# Patient Record
Sex: Male | Born: 1950 | Race: White | Hispanic: No | Marital: Married | Smoking: Current every day smoker
Health system: Southern US, Community
[De-identification: ages and names within clinical notes are randomized; demographics above are authoritative.]

## PROBLEM LIST (undated history)

## (undated) DIAGNOSIS — N50811 Right testicular pain: Secondary | ICD-10-CM

## (undated) DIAGNOSIS — Z950 Presence of cardiac pacemaker: Secondary | ICD-10-CM

## (undated) DIAGNOSIS — R310 Gross hematuria: Secondary | ICD-10-CM

## (undated) DIAGNOSIS — R972 Elevated prostate specific antigen [PSA]: Secondary | ICD-10-CM

## (undated) DIAGNOSIS — R361 Hematospermia: Secondary | ICD-10-CM

## (undated) DIAGNOSIS — Z860100 Personal history of colon polyps, unspecified: Secondary | ICD-10-CM

## (undated) DIAGNOSIS — J439 Emphysema, unspecified: Secondary | ICD-10-CM

## (undated) DIAGNOSIS — E785 Hyperlipidemia, unspecified: Secondary | ICD-10-CM

## (undated) DIAGNOSIS — C61 Malignant neoplasm of prostate: Secondary | ICD-10-CM

## (undated) DIAGNOSIS — D126 Benign neoplasm of colon, unspecified: Secondary | ICD-10-CM

## (undated) DIAGNOSIS — N50812 Left testicular pain: Secondary | ICD-10-CM

## (undated) DIAGNOSIS — D49519 Neoplasm of unspecified behavior of unspecified kidney: Secondary | ICD-10-CM

## (undated) DIAGNOSIS — T7840XA Allergy, unspecified, initial encounter: Secondary | ICD-10-CM

## (undated) DIAGNOSIS — Z720 Tobacco use: Secondary | ICD-10-CM

## (undated) DIAGNOSIS — C649 Malignant neoplasm of unspecified kidney, except renal pelvis: Secondary | ICD-10-CM

## (undated) DIAGNOSIS — Z923 Personal history of irradiation: Secondary | ICD-10-CM

## (undated) DIAGNOSIS — N529 Male erectile dysfunction, unspecified: Secondary | ICD-10-CM

## (undated) DIAGNOSIS — C3490 Malignant neoplasm of unspecified part of unspecified bronchus or lung: Secondary | ICD-10-CM

## (undated) DIAGNOSIS — M1990 Unspecified osteoarthritis, unspecified site: Secondary | ICD-10-CM

## (undated) DIAGNOSIS — M545 Low back pain, unspecified: Secondary | ICD-10-CM

## (undated) DIAGNOSIS — I10 Essential (primary) hypertension: Secondary | ICD-10-CM

## (undated) DIAGNOSIS — J189 Pneumonia, unspecified organism: Secondary | ICD-10-CM

## (undated) DIAGNOSIS — N281 Cyst of kidney, acquired: Secondary | ICD-10-CM

## (undated) DIAGNOSIS — H269 Unspecified cataract: Secondary | ICD-10-CM

## (undated) HISTORY — DX: Personal history of irradiation: Z92.3

## (undated) HISTORY — DX: Allergy, unspecified, initial encounter: T78.40XA

## (undated) HISTORY — DX: Emphysema, unspecified: J43.9

## (undated) HISTORY — DX: Low back pain, unspecified: M54.50

## (undated) HISTORY — DX: Unspecified cataract: H26.9

## (undated) HISTORY — DX: Tobacco use: Z72.0

## (undated) HISTORY — DX: Hematospermia: R36.1

## (undated) HISTORY — DX: Malignant neoplasm of unspecified kidney, except renal pelvis: C64.9

## (undated) HISTORY — PX: WISDOM TOOTH EXTRACTION: SHX21

## (undated) HISTORY — DX: Cyst of kidney, acquired: N28.1

## (undated) HISTORY — DX: Personal history of colon polyps, unspecified: Z86.0100

## (undated) HISTORY — DX: Neoplasm of unspecified behavior of unspecified kidney: D49.519

## (undated) HISTORY — DX: Male erectile dysfunction, unspecified: N52.9

## (undated) HISTORY — DX: Benign neoplasm of colon, unspecified: D12.6

## (undated) HISTORY — DX: Hyperlipidemia, unspecified: E78.5

## (undated) HISTORY — DX: Gross hematuria: R31.0

## (undated) HISTORY — DX: Low back pain: M54.5

## (undated) HISTORY — DX: Unspecified osteoarthritis, unspecified site: M19.90

## (undated) HISTORY — DX: Personal history of colonic polyps: Z86.010

## (undated) HISTORY — PX: COLONOSCOPY: SHX174

## (undated) HISTORY — PX: POLYPECTOMY: SHX149

## (undated) HISTORY — DX: Left testicular pain: N50.812

## (undated) HISTORY — DX: Right testicular pain: N50.811

## (undated) HISTORY — PX: INNER EAR SURGERY: SHX679

## (undated) HISTORY — PX: HERNIA REPAIR: SHX51

---

## 2004-10-05 ENCOUNTER — Emergency Department (HOSPITAL_COMMUNITY): Admission: EM | Admit: 2004-10-05 | Discharge: 2004-10-05 | Payer: Self-pay | Admitting: Family Medicine

## 2004-10-17 ENCOUNTER — Ambulatory Visit (HOSPITAL_COMMUNITY): Admission: RE | Admit: 2004-10-17 | Discharge: 2004-10-17 | Payer: Self-pay | Admitting: Family Medicine

## 2006-03-16 ENCOUNTER — Emergency Department (HOSPITAL_COMMUNITY): Admission: EM | Admit: 2006-03-16 | Discharge: 2006-03-16 | Payer: Self-pay | Admitting: Emergency Medicine

## 2006-08-19 ENCOUNTER — Ambulatory Visit (HOSPITAL_BASED_OUTPATIENT_CLINIC_OR_DEPARTMENT_OTHER): Admission: RE | Admit: 2006-08-19 | Discharge: 2006-08-19 | Payer: Self-pay | Admitting: Surgery

## 2007-03-20 DIAGNOSIS — D126 Benign neoplasm of colon, unspecified: Secondary | ICD-10-CM

## 2007-03-20 HISTORY — DX: Benign neoplasm of colon, unspecified: D12.6

## 2007-12-01 ENCOUNTER — Encounter: Payer: Self-pay | Admitting: Internal Medicine

## 2007-12-08 ENCOUNTER — Encounter: Payer: Self-pay | Admitting: Internal Medicine

## 2007-12-23 ENCOUNTER — Ambulatory Visit: Payer: Self-pay | Admitting: Internal Medicine

## 2008-01-06 ENCOUNTER — Ambulatory Visit: Payer: Self-pay | Admitting: Internal Medicine

## 2008-01-06 ENCOUNTER — Encounter: Payer: Self-pay | Admitting: Internal Medicine

## 2008-01-08 ENCOUNTER — Encounter: Payer: Self-pay | Admitting: Internal Medicine

## 2009-11-11 ENCOUNTER — Encounter: Payer: Self-pay | Admitting: Internal Medicine

## 2010-01-11 DIAGNOSIS — Z720 Tobacco use: Secondary | ICD-10-CM | POA: Insufficient documentation

## 2010-01-13 ENCOUNTER — Encounter (INDEPENDENT_AMBULATORY_CARE_PROVIDER_SITE_OTHER): Payer: Self-pay | Admitting: *Deleted

## 2010-04-04 ENCOUNTER — Encounter (INDEPENDENT_AMBULATORY_CARE_PROVIDER_SITE_OTHER): Payer: Self-pay | Admitting: *Deleted

## 2010-04-07 ENCOUNTER — Ambulatory Visit
Admission: RE | Admit: 2010-04-07 | Discharge: 2010-04-07 | Payer: Self-pay | Source: Home / Self Care | Attending: Internal Medicine | Admitting: Internal Medicine

## 2010-04-09 ENCOUNTER — Encounter: Payer: Self-pay | Admitting: Family Medicine

## 2010-04-20 NOTE — Letter (Signed)
Summary: [facility] Instructions  [facility]  [address] [place], [place] [address]   Phone: [phone]  Fax: [phone]       Dylan Harvey    [birth year]    MRN: [id number]        Procedure Day /Date:  Friday 04/21/3010     Arrival Time: 8:30 am     Procedure Time: 9:30 am     Location of Procedure:                    _ x_  [facility] (4th Floor)                        PREPARATION FOR COLONOSCOPY WITH MOVIPREP   Starting 5 days prior to your procedure Sunday 1/29 do not eat nuts, seeds, popcorn, corn, beans, peas,  salads, or any raw vegetables.  Do not take any fiber supplements (e.g. Metamucil, Citrucel, and Benefiber).  THE DAY BEFORE YOUR PROCEDURE         DATE: Thursday 2/2 1.  Drink clear liquids the entire day-NO SOLID FOOD  2.  Do not drink anything colored red or purple.  Avoid juices with pulp.  No orange juice.  3.  Drink at least 64 oz. (8 glasses) of fluid/clear liquids during the day to prevent dehydration and help the prep work efficiently.  CLEAR LIQUIDS INCLUDE: Water Jello Ice Popsicles Tea (sugar ok, no milk/cream) Powdered fruit flavored drinks Coffee (sugar ok, no milk/cream) Gatorade Juice: apple, white grape, white cranberry  Lemonade Clear bullion, consomm, broth Carbonated beverages (any kind) Strained chicken noodle soup Hard Candy                             4.  In the morning, mix first dose of MoviPrep solution:    Empty 1 Pouch A and 1 Pouch B into the disposable container    Add lukewarm drinking water to the top line of the container. Mix to dissolve    Refrigerate (mixed solution should be used within 24 hrs)  5.  Begin drinking the prep at 5:00 p.m. The MoviPrep container is divided by 4 marks.   Every 15 minutes drink the solution down to the next mark (approximately 8 oz) until the full liter is complete.   6.  Follow completed prep with 16 oz of clear liquid of your choice (Nothing red  or purple).  Continue to drink clear liquids until bedtime.  7.  Before going to bed, mix second dose of MoviPrep solution:    Empty 1 Pouch A and 1 Pouch B into the disposable container    Add lukewarm drinking water to the top line of the container. Mix to dissolve    Refrigerate  THE DAY OF YOUR PROCEDURE      DATE: Friday 2/3  Beginning at 4:30 a.m. (5 hours before procedure):         1. Every 15 minutes, drink the solution down to the next mark (approx 8 oz) until the full liter is complete.  2. Follow completed prep with 16 oz. of clear liquid of your choice.    3. You may drink clear liquids until 7:30 am (2 HOURS BEFORE PROCEDURE).   MEDICATION INSTRUCTIONS  Unless otherwise instructed, you should take regular prescription medications with a small sip of water   as early as possible the morning of your procedure.  OTHER INSTRUCTIONS  You will need a responsible adult at least 61 years of age to accompany you and drive you home.   This person must remain in the waiting room during your procedure.  Wear loose fitting clothing that is easily removed.  Leave jewelry and other valuables at home.  However, you may wish to bring a book to read or  an iPod/MP3 player to listen to music as you wait for your procedure to start.  Remove all body piercing jewelry and leave at home.  Total time from sign-in until discharge is approximately 2-3 hours.  You should go home directly after your procedure and rest.  You can resume normal activities the  day after your procedure.  The day of your procedure you should not:   Drive   Make legal decisions   Operate machinery   Drink alcohol   Return to work  You will receive specific instructions about eating, activities and medications before you leave.    The above instructions have been reviewed and explained to me by   Ezra Sites RN  April 07, 2010 11:41 AM    I fully understand and can verbalize  these instructions _____________________________ Date _________

## 2010-04-20 NOTE — Miscellaneous (Signed)
Summary: [facility]  Clinical Lists Changes  Medications: Added new medication of MOVIPREP 100 GM  SOLR (PEG-KCL-NACL-NASULF-NA ASC-C) As per prep instructions. - Signed Rx of MOVIPREP 100 GM  SOLR (PEG-KCL-NACL-NASULF-NA ASC-C) As per prep instructions.;  #1 x 0;  Signed;  Entered by: Ezra Sites RN;  Authorized by: Hilarie Fredrickson MD;  Method used: [facility] to [facility] [address] [phone]*, [address], [place], [place], [place]  [address], Ph: [phone] or [phone], Fax: [phone] Allergies: Changed allergy or adverse reaction from NEOSPORIN to NEOSPORIN    Prescriptions: MOVIPREP 100 GM  SOLR (PEG-KCL-NACL-NASULF-NA ASC-C) As per prep instructions.  #1 x 0   Entered by:   Ezra Sites RN   Authorized by:   Hilarie Fredrickson MD   Signed by:   Ezra Sites RN on 04/07/2010   Method used:   [facility]        [facility] [address] [phone]* (retail)       [address]       [place], [place]  [address]       Ph: [phone] or [phone]       Fax: [phone]   RxID:   [id number]

## 2010-04-20 NOTE — Letter (Signed)
Summary: Pre Visit Letter Revised  [facility]  [address] [place], [place] [address]   Phone: [phone]  Fax: [phone]        01/13/2010 MRN: [id number] Dylan Harvey [address] [place], [place]  [address]             Procedure Date: 02-28-10   Welcome to the Gastroenterology Division at [facility].    You are scheduled to see a nurse for your pre-procedure visit on 02-14-10 at 2:30p.m. on the 3rd floor at [facility], [address]  We ask that you try to arrive at our office 15 minutes prior to your appointment time to allow for check-in.  Please take a minute to review the attached form.  If you answer "Yes" to one or more of the questions on the first page, we ask that you call the person listed at your earliest opportunity.  If you answer "No" to all of the questions, please complete the rest of the form and bring it to your appointment.    Your nurse visit will consist of discussing your medical and surgical history, your immediate family medical history, and your medications.   If you are unable to list all of your medications on the form, please bring the medication bottles to your appointment and we will list them.  We will need to be aware of both prescribed and over the counter drugs.  We will need to know exact dosage information as well.    Please be prepared to read and sign documents such as consent forms, a financial agreement, and acknowledgement forms.  If necessary, and with your consent, a friend or relative is welcome to sit-in on the nurse visit with you.  Please bring your insurance card so that we may make a copy of it.  If your insurance requires a referral to see a specialist, please bring your referral form from your primary care physician.  No co-pay is required for this nurse visit.     If you cannot keep your appointment, please call [phone] to cancel or reschedule prior to your appointment date.  This allows  [place] the opportunity to schedule an appointment for another patient in need of care.    Thank you for choosing [facility] for your medical needs.  We appreciate the opportunity to care for you.  Please visit [place] at our website  to learn more about our practice.  Sincerely, The Gastroenterology Division

## 2010-04-20 NOTE — Letter (Signed)
Summary: Colonoscopy Letter  [facility]  [address] [place], [place] [address]   Phone: [phone]  Fax: [phone]      November 11, 2009 MRN: [id number]   AYIDEN MILLIMAN [address] [place], [place]  [address]   Dear Mr. Cudmore,   According to your medical record, it is time for you to schedule a Colonoscopy. The American Cancer Society recommends this procedure as a method to detect early colon cancer. Patients with a family history of colon cancer, or a personal history of colon polyps or inflammatory bowel disease are at increased risk.  This letter has been generated based on the recommendations made at the time of your procedure. If you feel that in your particular situation this may no longer apply, please contact our office.  Please call our office at [phone] to schedule this appointment or to update your records at your earliest convenience.  Thank you for cooperating with [place] to provide you with the very best care possible.   Sincerely,  Wilhemina Bonito. Marina Goodell, M.D.  [facility] Gastroenterology Division [phone]

## 2010-04-21 ENCOUNTER — Encounter: Payer: Self-pay | Admitting: Internal Medicine

## 2010-04-21 ENCOUNTER — Ambulatory Visit: Admit: 2010-04-21 | Payer: Self-pay | Admitting: Internal Medicine

## 2010-04-21 ENCOUNTER — Other Ambulatory Visit (AMBULATORY_SURGERY_CENTER): Payer: BC Managed Care – PPO | Admitting: Internal Medicine

## 2010-04-21 DIAGNOSIS — K5730 Diverticulosis of large intestine without perforation or abscess without bleeding: Secondary | ICD-10-CM

## 2010-04-21 DIAGNOSIS — Z1211 Encounter for screening for malignant neoplasm of colon: Secondary | ICD-10-CM

## 2010-04-21 DIAGNOSIS — Z86010 Personal history of colonic polyps: Secondary | ICD-10-CM

## 2010-05-04 NOTE — Procedures (Signed)
Summary: Colonoscopy   Colonoscopy  Procedure date:  04/21/2010  Findings:      Location:  [facility].    Procedures Next Due Date:    Colonoscopy: 04/2015 COLONOSCOPY PROCEDURE REPORT  PATIENT:  Dylan Harvey, Dylan Harvey  MR#:  [id number] BIRTHDATE:   01/01/1951, 59 yrs. old   GENDER:   male ENDOSCOPIST:   Wilhemina Bonito. Eda Keys, MD REF. BY: Surveillance Program Recall, PROCEDURE DATE:  04/21/2010 PROCEDURE:  Surveillance Colonoscopy ASA CLASS:   Class II INDICATIONS: history of pre-cancerous (adenomatous) colon polyps, surveillance and high-risk screening ; INDEX EXAM 12-2007 W/ MULTIPLE AND ADVANCED ADENOMAS MEDICATIONS:    Fentanyl 125 mcg IV, Versed 12 mg IV  DESCRIPTION OF PROCEDURE:   After the risks benefits and alternatives of the procedure were thoroughly explained, informed consent was obtained.  Digital rectal exam was performed and revealed no abnormalities.   The LB 180AL [id number] endoscope was introduced through the anus and advanced to the cecum, which was identified by both the appendix and ileocecal valve, without limitations.TIME TO CECUM = 4:48 MIN.  The quality of the prep was excellent, using MoviPrep.  The instrument was then slowly withdrawn (TIME = 11:11 MIN) as the colon was fully examined. <<PROCEDUREIMAGES>>        <<OLD IMAGES>>  FINDINGS:  Severe diverticulosis was found in the left colon.  Otherwise normal colonoscopy without  polyps, masses, vascular ectasias, or inflammatory changes.   Retroflexed views in the rectum revealed internal hemorrhoids.    The scope was then withdrawn from the patient and the procedure completed.  COMPLICATIONS:   None ENDOSCOPIC IMPRESSION:  1) Severe diverticulosis in the left colon  2) Otherwise nl colonoscopy  3) Internal hemorrhoids  RECOMMENDATIONS:  1) Follow up colonoscopy in 5 years  _______________________________ Wilhemina Bonito. Eda Keys, MD  CC: Rodrigo Ran, MD; The Patient

## 2010-08-04 NOTE — Op Note (Signed)
Dylan Harvey, Dylan Harvey              ACCOUNT NO.:  [id number]   MEDICAL RECORD NO.:  [id number]          PATIENT TYPE:  AMB   LOCATION:  DSC                          FACILITY:  [facility]   PHYSICIAN:  Currie Paris, M.D.DATE OF BIRTH:  [birth year]   DATE OF PROCEDURE:  DATE OF DISCHARGE:  08/19/2006                               OPERATIVE REPORT   ADDENDUM:  This is an addendum to job [id number].   The left groin area was clipped, prepped and draped and the time-out  occurred.  The inguinal incision was made deep into the external oblique  aponeurosis.  Bleeders were either tied or cauterized.  Additional local  was infiltrated as needed.   The external oblique was opened in the line of its fibers. The  ilioinguinal nerve appeared to be coming a little higher out and was  preserved.  The cord structures were dissected up off the inguinal floor  and surrounded with a Penrose drain.  There was a small amount of  preperitoneal fat that was protruding but there was no indirect sac  present.  The preperitoneal fat was clamped, ligated and amputated.   There was a moderate size direct defect present.  I imbricated the  fascia of the transversalis to keep everything from protruding out.  I  then placed an onlay mesh graft using Marlex.  This was done in the  usual fashion by tapering the medial aspect and sutured it in with a 2-0  Prolene starting at the pubic tubercle and running laterally to the  level of the deep ring.  This laid nicely under the external oblique and  on the internal oblique medially and tacked down with additional  sutures.  The area was then checked for hemostasis.  I had split the  mesh to go around the cord.   The external oblique was closed in the line of its fibers.      Currie Paris, M.D.  Electronically Signed     CJS/MEDQ  D:  08/21/2006  T:  08/21/2006  Job:  [id number]

## 2010-08-04 NOTE — Op Note (Signed)
Dylan Harvey, Dylan Harvey              ACCOUNT NO.:  [id number]   MEDICAL RECORD NO.:  [id number]          PATIENT TYPE:  AMB   LOCATION:  DSC                          FACILITY:  [facility]   PHYSICIAN:  Currie Paris, M.D.DATE OF BIRTH:  29-Aug-1950   DATE OF PROCEDURE:  08/19/2006  DATE OF DISCHARGE:                               OPERATIVE REPORT   PREOP DIAGNOSIS:  Left inguinal hernia.   POSTOPERATIVE DIAGNOSIS:  Left inguinal hernia -- direct.   OPERATION:  Repair with mesh.   SURGEON:  Currie Paris, M.D.   ANESTHESIA:  MAC.   CLINICAL HISTORY:  This is a 60 year old gentleman with an inguinal  hernia that he wished to have repaired.   DESCRIPTION OF PROCEDURE:  The patient was seen in the holding area; and  had no further questions.  We confirmed that the left inguinal hernia  was the planned procedure; and he and I both initialed the left inguinal  area as the operative site.   The patient was taken to the operating room and given IV sedation.  The  left groin area was clipped, prepped, and draped; and the time-out  occurred.   A combination of 1% Xylocaine with epinephrine and 0.5% plain Marcaine  was mixed equally and used for local.  I made an infiltrate along the  incision line and below the anterior-superior iliac   __________   The external oblique aponeurosis was closed in the line of its fibers.  The Scarpa's was closed; and the skin closed with 4-0  Monocryl  subcuticular plus Dermabond.   The patient tolerated the procedure well; and there were no operative  complications.  All counts were correct.      Currie Paris, M.D.  Electronically Signed     CJS/MEDQ  D:  08/19/2006  T:  08/19/2006  Job:  [id number]   cc:   Loraine Leriche A. Perini, M.D.

## 2010-09-21 ENCOUNTER — Emergency Department (HOSPITAL_COMMUNITY)
Admission: EM | Admit: 2010-09-21 | Discharge: 2010-09-21 | Disposition: A | Discharge status: Home / Self Care | Payer: BC Managed Care – PPO | Attending: Emergency Medicine | Admitting: Emergency Medicine

## 2010-09-21 ENCOUNTER — Emergency Department (HOSPITAL_COMMUNITY): Payer: BC Managed Care – PPO

## 2010-09-21 DIAGNOSIS — R296 Repeated falls: Secondary | ICD-10-CM | POA: Insufficient documentation

## 2010-09-21 DIAGNOSIS — Y92009 Unspecified place in unspecified non-institutional (private) residence as the place of occurrence of the external cause: Secondary | ICD-10-CM | POA: Insufficient documentation

## 2010-09-21 DIAGNOSIS — M25519 Pain in unspecified shoulder: Secondary | ICD-10-CM | POA: Insufficient documentation

## 2010-09-21 DIAGNOSIS — S43109A Unspecified dislocation of unspecified acromioclavicular joint, initial encounter: Secondary | ICD-10-CM | POA: Insufficient documentation

## 2010-09-21 DIAGNOSIS — I10 Essential (primary) hypertension: Secondary | ICD-10-CM | POA: Insufficient documentation

## 2011-03-28 ENCOUNTER — Other Ambulatory Visit: Payer: Self-pay | Admitting: Internal Medicine

## 2011-03-28 DIAGNOSIS — R918 Other nonspecific abnormal finding of lung field: Secondary | ICD-10-CM

## 2011-03-30 ENCOUNTER — Ambulatory Visit
Admission: RE | Admit: 2011-03-30 | Discharge: 2011-03-30 | Disposition: A | Discharge status: Home / Self Care | Payer: BC Managed Care – PPO | Source: Ambulatory Visit | Attending: Internal Medicine | Admitting: Internal Medicine

## 2011-03-30 DIAGNOSIS — R918 Other nonspecific abnormal finding of lung field: Secondary | ICD-10-CM

## 2011-03-30 MED ORDER — IOHEXOL 300 MG/ML  SOLN
75.0000 mL | Freq: Once | INTRAMUSCULAR | Status: AC | PRN
  Administered 2011-03-30: 75 mL via INTRAVENOUS

## 2012-03-19 ENCOUNTER — Encounter (HOSPITAL_COMMUNITY): Payer: Self-pay | Admitting: *Deleted

## 2012-03-19 ENCOUNTER — Emergency Department (HOSPITAL_COMMUNITY): Payer: BC Managed Care – PPO

## 2012-03-19 ENCOUNTER — Other Ambulatory Visit: Payer: Self-pay

## 2012-03-19 ENCOUNTER — Emergency Department (HOSPITAL_COMMUNITY)
Admission: EM | Admit: 2012-03-19 | Discharge: 2012-03-19 | Disposition: A | Discharge status: Home / Self Care | Payer: BC Managed Care – PPO | Attending: Emergency Medicine | Admitting: Emergency Medicine

## 2012-03-19 DIAGNOSIS — F17200 Nicotine dependence, unspecified, uncomplicated: Secondary | ICD-10-CM | POA: Insufficient documentation

## 2012-03-19 DIAGNOSIS — F10929 Alcohol use, unspecified with intoxication, unspecified: Secondary | ICD-10-CM

## 2012-03-19 DIAGNOSIS — F1010 Alcohol abuse, uncomplicated: Secondary | ICD-10-CM | POA: Insufficient documentation

## 2012-03-19 DIAGNOSIS — I10 Essential (primary) hypertension: Secondary | ICD-10-CM | POA: Insufficient documentation

## 2012-03-19 DIAGNOSIS — S51809A Unspecified open wound of unspecified forearm, initial encounter: Secondary | ICD-10-CM | POA: Insufficient documentation

## 2012-03-19 DIAGNOSIS — S0990XA Unspecified injury of head, initial encounter: Secondary | ICD-10-CM | POA: Insufficient documentation

## 2012-03-19 DIAGNOSIS — Y939 Activity, unspecified: Secondary | ICD-10-CM | POA: Insufficient documentation

## 2012-03-19 DIAGNOSIS — Y929 Unspecified place or not applicable: Secondary | ICD-10-CM | POA: Insufficient documentation

## 2012-03-19 DIAGNOSIS — W108XXA Fall (on) (from) other stairs and steps, initial encounter: Secondary | ICD-10-CM | POA: Insufficient documentation

## 2012-03-19 DIAGNOSIS — Z79899 Other long term (current) drug therapy: Secondary | ICD-10-CM | POA: Insufficient documentation

## 2012-03-19 DIAGNOSIS — S51811A Laceration without foreign body of right forearm, initial encounter: Secondary | ICD-10-CM

## 2012-03-19 HISTORY — DX: Essential (primary) hypertension: I10

## 2012-03-19 LAB — CBC WITH DIFFERENTIAL/PLATELET
Basophils Relative: 0 % (ref 0–1)
Eosinophils Relative: 1 % (ref 0–5)
HCT: 33.4 % — ABNORMAL LOW (ref 39.0–52.0)
Lymphs Abs: 1.9 10*3/uL (ref 0.7–4.0)
MCV: 94.4 fL (ref 78.0–100.0)
Monocytes Relative: 8 % (ref 3–12)
Neutrophils Relative %: 75 % (ref 43–77)
RBC: 3.54 MIL/uL — ABNORMAL LOW (ref 4.22–5.81)
WBC: 12.5 10*3/uL — ABNORMAL HIGH (ref 4.0–10.5)

## 2012-03-19 LAB — BASIC METABOLIC PANEL
Calcium: 9.3 mg/dL (ref 8.4–10.5)
Chloride: 93 mEq/L — ABNORMAL LOW (ref 96–112)
Creatinine, Ser: 0.78 mg/dL (ref 0.50–1.35)
GFR calc Af Amer: >90 mL/min (ref >90)
Glucose, Bld: 98 mg/dL (ref 70–99)

## 2012-03-19 LAB — TYPE AND SCREEN
ABO/RH(D): O POS
Antibody Screen: NEGATIVE

## 2012-03-19 LAB — ABO/RH: ABO/RH(D): O POS

## 2012-03-19 LAB — ETHANOL: Alcohol, Ethyl (B): 202 mg/dL — ABNORMAL HIGH (ref 0–11)

## 2012-03-19 NOTE — ED Notes (Signed)
Spinal board removed  After examing by the doctor.

## 2012-03-19 NOTE — ED Notes (Addendum)
Pt to ED via EMS stating fell down 4 stairs into a painting, breaking glass.  ETOH on board, 2.5 martinis.  No loc and pt not on blood thinners.  Denies neck or back pain.  No deformities per EMS.  When EMS arrived, pt was lying face down, forehead resting on ground.

## 2012-03-19 NOTE — ED Provider Notes (Signed)
History     CSN: [id number]  Arrival date & time 03/19/12  0200   First MD Initiated Contact with Patient 03/19/12 [phone]      Chief Complaint  Patient presents with  . Fall     Patient is a 62 y.o. male presenting with fall. The history is provided by the patient.  Fall Incident onset: just prior to arrival. The fall occurred while walking. The point of impact was the head. The pain is mild. There was alcohol use involved in the accident. Pertinent negatives include no visual change, no fever, no abdominal pain, no nausea, no vomiting, no headaches and no loss of consciousness. The symptoms are aggravated by activity. Treatment on scene includes a c-collar and a backboard. He has tried rest for the symptoms. The treatment provided mild relief.  pt presents after mechanical fall. He reports he fell down one step while walking.  He denies LOC or HA.  He reports he may have injured his right wrist from the fall.    No cp/sob reported.  No focal weakness reported.  Past Medical History  Diagnosis Date  . Hypertension     Past Surgical History  Procedure Date  . Hernia repair     inguinal    No family history on file.  History  Substance Use Topics  . Smoking status: Current Every Day Smoker -- 1.0 packs/day    Types: Cigarettes  . Smokeless tobacco: Not on file  . Alcohol Use: 8.4 oz/week    14 Shots of liquor per week      Review of Systems  Constitutional: Negative for fever.  HENT: Negative for neck pain.   Respiratory: Negative for chest tightness and shortness of breath.   Cardiovascular: Negative for chest pain.  Gastrointestinal: Negative for nausea, vomiting and abdominal pain.  Musculoskeletal: Negative for back pain.  Skin: Positive for wound.  Neurological: Negative for loss of consciousness, weakness and headaches.  All other systems reviewed and are negative.    Allergies  Neomycin-bacitracin zn-polymyx  Home Medications   Current Outpatient Rx    Name  Route  Sig  Dispense  Refill  . LISINOPRIL 10 MG PO TABS   Oral   Take 10 mg by mouth daily.         . ADULT MULTIVITAMIN W/MINERALS CH   Oral   Take 1 tablet by mouth daily.         Marland Kitchen SALINE 0.65 % NA SOLN   Nasal   Place 1 spray into the nose daily.           BP 128/73  Pulse 96  Temp 97.9 F (36.6 C) (Oral)  Resp 22  SpO2 100%  Physical Exam CONSTITUTIONAL: Well developed/well nourished HEAD AND FACE: Normocephalic/atraumatic EYES: EOMI/PERRL ENMT: Mucous membranes moist, No evidence of facial/nasal trauma NECK: supple no meningeal signs SPINE:entire spine nontender, NEXUS criteria met.  No bruising/crepitance/stepoffs noted to spine, Patient maintained in spinal precautions/logroll utilized CV: S1/S2 noted, no murmurs/rubs/gallops noted LUNGS: Lungs are clear to auscultation bilaterally, no apparent distress ABDOMEN: soft, nontender, no rebound or guarding GU:no cva tenderness NEURO: Pt is awake/alert, moves all extremitiesx4, GCS 15.   EXTREMITIES: pulses normal, full ROM. Scattered abrasions to his extremities.  He has laceration to right distal forearm and laceration to right hand. Bleeding controlled.  He has full extension/flexion of right wrist and also full extension of all fingers on right hand without any associated weakness All other extremities/joints palpated/ranged and nontender SKIN:  warm, color normal PSYCH: no abnormalities of mood noted  ED Course  Procedures  LACERATION REPAIR Performed by: Joya Gaskins Consent: Verbal consent obtained. Risks and benefits: risks, benefits and alternatives were discussed Patient identity confirmed: provided demographic data Time out performed prior to procedure Prepped and Draped in normal sterile fashion Wound explored Laceration Location: right hand Laceration Length: 0.5cm No Foreign Bodies seen or palpated Anesthesia: local infiltration Local anesthetic: lidocaine 1% with  epinephrine Anesthetic total: 2 ml Irrigation method: syringe Amount of cleaning: standard Skin closure: simple Number of sutures or staples: 2 sutures Technique: simple interrupted Patient tolerance: Patient tolerated the procedure well with no immediate complications.  LACERATION REPAIR Performed by: Joya Gaskins Consent: Verbal consent obtained. Risks and benefits: risks, benefits and alternatives were discussed Patient identity confirmed: provided demographic data Time out performed prior to procedure Prepped and Draped in normal sterile fashion Wound explored Laceration Location: right distal forearm, dorsal aspect Laceration Length: 4cm No Foreign Bodies seen or palpated.  Tendon is exposed. No bone exposed.  No obvious tendon laceration is noted Anesthesia: local infiltration Local anesthetic: lidocaine 1% with epinephrine Anesthetic total: 10 ml Irrigation method: syringe Amount of cleaning: extensive Skin closure: simple interrupted Number of sutures or staples: 7 Technique: simple interrupted Patient tolerance: Patient tolerated the procedure well with no immediate complications.     Labs Reviewed  BASIC METABOLIC PANEL  CBC WITH DIFFERENTIAL  TYPE AND SCREEN   Dg Forearm Right  03/19/2012  *RADIOLOGY REPORT*  Clinical Data: Lacerations over the right forearm from glass.  RIGHT FOREARM - 2 VIEW  Comparison: None.  Findings: There is no evidence of fracture or dislocation.  The radius and ulna appear intact.  A soft tissue laceration is noted along the radial aspect of the mid to distal forearm.  The elbow joint is grossly unremarkable in appearance; no elbow joint effusion is identified.  No radiopaque foreign bodies are seen.  IMPRESSION: No evidence of fracture or dislocation; no radiopaque foreign bodies seen.   Original Report Authenticated By: Tonia Ghent, M.D.    Dg Hand Complete Right  03/19/2012  *RADIOLOGY REPORT*  Clinical Data: Lacerations over the  right hand and right forearm. Cut with glass.  RIGHT HAND - COMPLETE 3+ VIEW  Comparison: None.  Findings: There is no evidence of fracture or dislocation.  The joint spaces are preserved.  The carpal rows are intact, and demonstrate normal alignment.  There is question of a tiny 2 mm radiopaque foreign body at the radial aspect of the base of the third digit, and there appears to be a 4 mm radiopaque foreign body at the ulnar aspect of the distal tuft of the third digit.  Known soft tissue lacerations are not well characterized.  IMPRESSION:  1.  No evidence of fracture or dislocation. 2.  Question of a tiny radiopaque foreign body at the radial aspect of the base of the third digit, and apparent 4 mm radiopaque foreign body at the ulnar aspect of the distal tuft of the third digit; no additional evidence for radiopaque foreign body.   Original Report Authenticated By: Tonia Ghent, M.D.    Pt not distracted, well appearing, no distress. NEXUS criteria met   3:42 AM D/w radiology concerning CT findings Will call nsgy and trauma Pt GCS 15.  No distress noted 4:54 AM D/w dr Newell Coral He feels this is artifact He requests we repeat ct head with thinner slices D/w radiology and they agree  7:01 AM Ct head  negative on repeat study Pt otherwise feels well Given depth of injury to right forearm and tendon exposed (though no obvious laceration and he has full extension of all fingers) will place volar splint and have f/u with hand surgeon for re-exam of his forearm/hand.  MDM  Nursing notes including past medical history and social history reviewed and considered in documentation Labs/vital reviewed and considered xrays reviewed and considered        Date: 03/19/2012  Rate: 90  Rhythm: normal sinus rhythm  QRS Axis: normal  Intervals: normal  ST/T Wave abnormalities: nonspecific ST changes  Conduction Disutrbances:none     Joya Gaskins, MD 03/19/12 [phone]

## 2012-03-19 NOTE — Progress Notes (Signed)
Orthopedic Tech Progress Note Patient Details:  Dylan Harvey August 22, 1950 [id number]  Ortho Devices Type of Ortho Device: Volar splint Ortho Device/Splint Location: RIGHT VOLAR SPLINT Ortho Device/Splint Interventions: Application   [name], [name] 03/19/2012, 7:00 AM

## 2012-05-30 ENCOUNTER — Other Ambulatory Visit: Payer: Self-pay | Admitting: Internal Medicine

## 2012-05-30 DIAGNOSIS — K5730 Diverticulosis of large intestine without perforation or abscess without bleeding: Secondary | ICD-10-CM | POA: Insufficient documentation

## 2012-05-30 DIAGNOSIS — J449 Chronic obstructive pulmonary disease, unspecified: Secondary | ICD-10-CM | POA: Insufficient documentation

## 2012-06-04 ENCOUNTER — Ambulatory Visit
Admission: RE | Admit: 2012-06-04 | Discharge: 2012-06-04 | Disposition: A | Discharge status: Home / Self Care | Payer: BC Managed Care – PPO | Source: Ambulatory Visit | Attending: Internal Medicine | Admitting: Internal Medicine

## 2012-06-04 DIAGNOSIS — R222 Localized swelling, mass and lump, trunk: Secondary | ICD-10-CM

## 2012-06-04 MED ORDER — IOHEXOL 300 MG/ML  SOLN
75.0000 mL | Freq: Once | INTRAMUSCULAR | Status: AC | PRN
  Administered 2012-06-04: 75 mL via INTRAVENOUS

## 2013-11-03 DIAGNOSIS — M1990 Unspecified osteoarthritis, unspecified site: Secondary | ICD-10-CM | POA: Insufficient documentation

## 2013-12-14 ENCOUNTER — Encounter: Payer: Self-pay | Admitting: Internal Medicine

## 2013-12-30 ENCOUNTER — Other Ambulatory Visit: Payer: Self-pay | Admitting: Dermatology

## 2014-06-23 ENCOUNTER — Other Ambulatory Visit: Payer: Self-pay | Admitting: Internal Medicine

## 2014-06-23 DIAGNOSIS — R911 Solitary pulmonary nodule: Secondary | ICD-10-CM

## 2014-06-30 ENCOUNTER — Ambulatory Visit
Admission: RE | Admit: 2014-06-30 | Discharge: 2014-06-30 | Disposition: A | Discharge status: Home / Self Care | Payer: BLUE CROSS/BLUE SHIELD | Source: Ambulatory Visit | Attending: Internal Medicine | Admitting: Internal Medicine

## 2014-06-30 DIAGNOSIS — R911 Solitary pulmonary nodule: Secondary | ICD-10-CM

## 2014-08-30 DIAGNOSIS — R05 Cough: Secondary | ICD-10-CM | POA: Diagnosis not present

## 2014-08-30 DIAGNOSIS — I10 Essential (primary) hypertension: Secondary | ICD-10-CM | POA: Diagnosis not present

## 2014-08-30 DIAGNOSIS — F17200 Nicotine dependence, unspecified, uncomplicated: Secondary | ICD-10-CM | POA: Diagnosis not present

## 2014-08-30 DIAGNOSIS — Z6822 Body mass index (BMI) 22.0-22.9, adult: Secondary | ICD-10-CM | POA: Diagnosis not present

## 2014-08-30 DIAGNOSIS — J449 Chronic obstructive pulmonary disease, unspecified: Secondary | ICD-10-CM | POA: Diagnosis not present

## 2014-09-22 DIAGNOSIS — H6506 Acute serous otitis media, recurrent, bilateral: Secondary | ICD-10-CM | POA: Diagnosis not present

## 2014-09-22 DIAGNOSIS — H903 Sensorineural hearing loss, bilateral: Secondary | ICD-10-CM | POA: Diagnosis not present

## 2014-09-22 DIAGNOSIS — H6063 Unspecified chronic otitis externa, bilateral: Secondary | ICD-10-CM | POA: Diagnosis not present

## 2014-10-11 DIAGNOSIS — H903 Sensorineural hearing loss, bilateral: Secondary | ICD-10-CM | POA: Diagnosis not present

## 2014-10-11 DIAGNOSIS — H6502 Acute serous otitis media, left ear: Secondary | ICD-10-CM | POA: Diagnosis not present

## 2014-11-03 DIAGNOSIS — H6063 Unspecified chronic otitis externa, bilateral: Secondary | ICD-10-CM | POA: Diagnosis not present

## 2014-11-03 DIAGNOSIS — H903 Sensorineural hearing loss, bilateral: Secondary | ICD-10-CM | POA: Diagnosis not present

## 2014-12-01 DIAGNOSIS — H6062 Unspecified chronic otitis externa, left ear: Secondary | ICD-10-CM | POA: Diagnosis not present

## 2014-12-01 DIAGNOSIS — H903 Sensorineural hearing loss, bilateral: Secondary | ICD-10-CM | POA: Diagnosis not present

## 2014-12-22 DIAGNOSIS — H903 Sensorineural hearing loss, bilateral: Secondary | ICD-10-CM | POA: Diagnosis not present

## 2014-12-22 DIAGNOSIS — H6062 Unspecified chronic otitis externa, left ear: Secondary | ICD-10-CM | POA: Diagnosis not present

## 2015-01-22 DIAGNOSIS — M19042 Primary osteoarthritis, left hand: Secondary | ICD-10-CM | POA: Diagnosis not present

## 2015-01-22 DIAGNOSIS — M19041 Primary osteoarthritis, right hand: Secondary | ICD-10-CM | POA: Diagnosis not present

## 2015-02-09 DIAGNOSIS — H903 Sensorineural hearing loss, bilateral: Secondary | ICD-10-CM | POA: Diagnosis not present

## 2015-02-09 DIAGNOSIS — H6062 Unspecified chronic otitis externa, left ear: Secondary | ICD-10-CM | POA: Diagnosis not present

## 2015-04-18 DIAGNOSIS — H903 Sensorineural hearing loss, bilateral: Secondary | ICD-10-CM | POA: Diagnosis not present

## 2015-04-18 DIAGNOSIS — H6062 Unspecified chronic otitis externa, left ear: Secondary | ICD-10-CM | POA: Diagnosis not present

## 2015-05-24 ENCOUNTER — Encounter: Payer: Self-pay | Admitting: Internal Medicine

## 2015-07-14 DIAGNOSIS — H903 Sensorineural hearing loss, bilateral: Secondary | ICD-10-CM | POA: Diagnosis not present

## 2015-07-14 DIAGNOSIS — H6063 Unspecified chronic otitis externa, bilateral: Secondary | ICD-10-CM | POA: Diagnosis not present

## 2015-08-08 ENCOUNTER — Ambulatory Visit (AMBULATORY_SURGERY_CENTER): Payer: Self-pay | Admitting: *Deleted

## 2015-08-08 VITALS — Ht 72.0 in | Wt 155.8 lb

## 2015-08-08 DIAGNOSIS — Z86010 Personal history of colonic polyps: Secondary | ICD-10-CM

## 2015-08-08 MED ORDER — NA SULFATE-K SULFATE-MG SULF 17.5-3.13-1.6 GM/177ML PO SOLN: 1.0000 | Freq: Once | ORAL | Status: DC

## 2015-08-08 NOTE — Progress Notes (Signed)
No egg or soy allergy known to patient  No issues with past sedation with any surgeries  or procedures, no intubation problems  No diet pills per patient No home 02 use per patient  No blood thinners per patient  Pt denies issues with constipation  emmi video declined Pt is EXTREMELY scared of needles, very needle phobic- cannot look at the needles - he has to close eyes

## 2015-08-10 DIAGNOSIS — Z125 Encounter for screening for malignant neoplasm of prostate: Secondary | ICD-10-CM | POA: Diagnosis not present

## 2015-08-10 DIAGNOSIS — Z0000 Encounter for general adult medical examination without abnormal findings: Secondary | ICD-10-CM | POA: Insufficient documentation

## 2015-08-10 DIAGNOSIS — E784 Other hyperlipidemia: Secondary | ICD-10-CM | POA: Diagnosis not present

## 2015-08-10 DIAGNOSIS — I10 Essential (primary) hypertension: Secondary | ICD-10-CM | POA: Diagnosis not present

## 2015-08-17 DIAGNOSIS — E784 Other hyperlipidemia: Secondary | ICD-10-CM | POA: Diagnosis not present

## 2015-08-17 DIAGNOSIS — K5730 Diverticulosis of large intestine without perforation or abscess without bleeding: Secondary | ICD-10-CM | POA: Diagnosis not present

## 2015-08-17 DIAGNOSIS — R809 Proteinuria, unspecified: Secondary | ICD-10-CM | POA: Insufficient documentation

## 2015-08-17 DIAGNOSIS — Z1389 Encounter for screening for other disorder: Secondary | ICD-10-CM | POA: Diagnosis not present

## 2015-08-17 DIAGNOSIS — I10 Essential (primary) hypertension: Secondary | ICD-10-CM | POA: Diagnosis not present

## 2015-08-17 DIAGNOSIS — N528 Other male erectile dysfunction: Secondary | ICD-10-CM | POA: Diagnosis not present

## 2015-08-17 DIAGNOSIS — J449 Chronic obstructive pulmonary disease, unspecified: Secondary | ICD-10-CM | POA: Diagnosis not present

## 2015-08-17 DIAGNOSIS — R972 Elevated prostate specific antigen [PSA]: Secondary | ICD-10-CM | POA: Diagnosis not present

## 2015-08-17 DIAGNOSIS — Z6822 Body mass index (BMI) 22.0-22.9, adult: Secondary | ICD-10-CM | POA: Diagnosis not present

## 2015-08-17 DIAGNOSIS — M1990 Unspecified osteoarthritis, unspecified site: Secondary | ICD-10-CM | POA: Diagnosis not present

## 2015-08-17 DIAGNOSIS — Z0000 Encounter for general adult medical examination without abnormal findings: Secondary | ICD-10-CM | POA: Diagnosis not present

## 2015-08-17 DIAGNOSIS — R222 Localized swelling, mass and lump, trunk: Secondary | ICD-10-CM | POA: Diagnosis not present

## 2015-08-17 DIAGNOSIS — M545 Low back pain: Secondary | ICD-10-CM | POA: Diagnosis not present

## 2015-08-22 ENCOUNTER — Encounter: Payer: Self-pay | Admitting: Internal Medicine

## 2015-08-22 ENCOUNTER — Ambulatory Visit (AMBULATORY_SURGERY_CENTER): Payer: Medicare Other | Admitting: Internal Medicine

## 2015-08-22 VITALS — BP 122/77 | HR 63 | Temp 97.3°F | Resp 22 | Ht 72.0 in | Wt 155.0 lb

## 2015-08-22 DIAGNOSIS — D122 Benign neoplasm of ascending colon: Secondary | ICD-10-CM

## 2015-08-22 DIAGNOSIS — D123 Benign neoplasm of transverse colon: Secondary | ICD-10-CM | POA: Diagnosis not present

## 2015-08-22 DIAGNOSIS — I10 Essential (primary) hypertension: Secondary | ICD-10-CM | POA: Diagnosis not present

## 2015-08-22 DIAGNOSIS — Z86010 Personal history of colonic polyps: Secondary | ICD-10-CM | POA: Diagnosis present

## 2015-08-22 MED ORDER — SODIUM CHLORIDE 0.9 % IV SOLN: 500.0000 mL | INTRAVENOUS | Status: DC

## 2015-08-22 NOTE — Progress Notes (Signed)
Called to room to assist during endoscopic procedure.  Patient ID and intended procedure confirmed with present staff. Received instructions for my participation in the procedure from the performing physician.  

## 2015-08-22 NOTE — Op Note (Signed)
[facility] Patient Name: Dylan Harvey Procedure Date: 08/22/2015 1:45 PM MRN: [id number] Endoscopist: Docia Chuck. Henrene Pastor , MD Age: 65 Referring MD:  Date of Birth: 06/04/1950 Gender: Male Procedure:                Colonoscopy, with snare polypectomy x 2 Indications:              High risk colon cancer surveillance: Personal                            history of adenoma (10 mm or greater in size), High                            risk colon cancer surveillance: Personal history of                            multiple (3 or more) adenomas. Index examination                            2009 with multiple and advanced polyps. Follow-up                            2012 negative for neoplasia Medicines:                Monitored Anesthesia Care Procedure:                Pre-Anesthesia Assessment:                           - Prior to the procedure, a History and Physical                            was performed, and patient medications and                            allergies were reviewed. The patient's tolerance of                            previous anesthesia was also reviewed. The risks                            and benefits of the procedure and the sedation                            options and risks were discussed with the patient.                            All questions were answered, and informed consent                            was obtained. Prior Anticoagulants: The patient has                            taken no previous anticoagulant or antiplatelet  agents. ASA Grade Assessment: II - A patient with                            mild systemic disease. After reviewing the risks                            and benefits, the patient was deemed in                            satisfactory condition to undergo the procedure.                           After obtaining informed consent, the colonoscope                            was passed under direct vision.  Throughout the                            procedure, the patient's blood pressure, pulse, and                            oxygen saturations were monitored continuously. The                            Model CF-HQ190L [id number]) scope was introduced                            through the anus and advanced to the the cecum,                            identified by appendiceal orifice and ileocecal                            valve. The ileocecal valve, appendiceal orifice,                            and rectum were photographed. The quality of the                            bowel preparation was excellent. The colonoscopy                            was performed without difficulty. The patient                            tolerated the procedure well. The bowel preparation                            used was SUPREP. Scope In: 1:49:21 PM Scope Out: 2:08:01 PM Scope Withdrawal Time: 0 hours 13 minutes 54 seconds  Total Procedure Duration: 0 hours 18 minutes 40 seconds  Findings:                 Two sessile polyps were found in the transverse  colon and ascending colon. The polyps were 4 to 6                            mm in size. These polyps were removed with a cold                            snare. Resection and retrieval were complete.                           Multiple small and large-mouthed diverticula were                            found in the sigmoid colon(severe changes).                           Internal hemorrhoids were found during retroflexion.                           The exam was otherwise without abnormality on                            direct and retroflexion views. Complications:            No immediate complications. Estimated blood loss:                            None. Estimated Blood Loss:     Estimated blood loss: none. Recommendation:           - Repeat colonoscopy in 5 years for surveillance.                           - Await pathology  results. Docia Chuck. Henrene Pastor, MD 08/22/2015 2:13:27 PM This report has been signed electronically. CC Letter to:             Mark A. Perini, MD

## 2015-08-22 NOTE — Progress Notes (Signed)
Report to PACU, RN, vss, BBS= Clear.  

## 2015-08-22 NOTE — Patient Instructions (Signed)
YOU HAD AN ENDOSCOPIC PROCEDURE TODAY AT [facility]:   Refer to the procedure report that was given to you for any specific questions about what was found during the examination.  If the procedure report does not answer your questions, please call your gastroenterologist to clarify.  If you requested that your care partner not be given the details of your procedure findings, then the procedure report has been included in a sealed envelope for you to review at your convenience later.  YOU SHOULD EXPECT: Some feelings of bloating in the abdomen. Passage of more gas than usual.  Walking can help get rid of the air that was put into your GI tract during the procedure and reduce the bloating. If you had a lower endoscopy (such as a colonoscopy or flexible sigmoidoscopy) you may notice spotting of blood in your stool or on the toilet paper. If you underwent a bowel prep for your procedure, you may not have a normal bowel movement for a few days.  Please Note:  You might notice some irritation and congestion in your nose or some drainage.  This is from the oxygen used during your procedure.  There is no need for concern and it should clear up in a day or so.  SYMPTOMS TO REPORT IMMEDIATELY:   Following lower endoscopy (colonoscopy or flexible sigmoidoscopy):  Excessive amounts of blood in the stool  Significant tenderness or worsening of abdominal pains  Swelling of the abdomen that is new, acute  Fever of 100F or higher   For urgent or emergent issues, a gastroenterologist can be reached at any hour by calling [phone].   DIET: Your first meal following the procedure should be a small meal and then it is ok to progress to your normal diet. Heavy or fried foods are harder to digest and may make you feel nauseous or bloated.  Likewise, meals heavy in dairy and vegetables can increase bloating.  Drink plenty of fluids but you should avoid alcoholic beverages for 24 hours. Try to  increase the fiber in your diet, and drink plenty of water.  ACTIVITY:  You should plan to take it easy for the rest of today and you should NOT DRIVE or use heavy machinery until tomorrow (because of the sedation medicines used during the test).    FOLLOW UP: Our staff will call the number listed on your records the next business day following your procedure to check on you and address any questions or concerns that you may have regarding the information given to you following your procedure. If we do not reach you, we will leave a message.  However, if you are feeling well and you are not experiencing any problems, there is no need to return our call.  We will assume that you have returned to your regular daily activities without incident.  If any biopsies were taken you will be contacted by phone or by letter within the next 1-3 weeks.  Please call us at [phone] if you have not heard about the biopsies in 3 weeks.    SIGNATURES/CONFIDENTIALITY: You and/or your care partner have signed paperwork which will be entered into your electronic medical record.  These signatures attest to the fact that that the information above on your After Visit Summary has been reviewed and is understood.  Full responsibility of the confidentiality of this discharge information lies with you and/or your care-partner.  Read all of the handouts given to you by your recovery  room nurse.  Thank-you for choosing [facility] for your healthcare needs today.

## 2015-08-23 ENCOUNTER — Telehealth: Payer: Self-pay | Admitting: *Deleted

## 2015-08-23 NOTE — Telephone Encounter (Signed)
Attempted to call x2 voicemail is full unable to leave message.

## 2015-08-24 DIAGNOSIS — H6063 Unspecified chronic otitis externa, bilateral: Secondary | ICD-10-CM | POA: Diagnosis not present

## 2015-08-24 DIAGNOSIS — H903 Sensorineural hearing loss, bilateral: Secondary | ICD-10-CM | POA: Diagnosis not present

## 2015-08-26 ENCOUNTER — Encounter: Payer: Self-pay | Admitting: Internal Medicine

## 2015-10-12 DIAGNOSIS — H903 Sensorineural hearing loss, bilateral: Secondary | ICD-10-CM | POA: Diagnosis not present

## 2015-10-12 DIAGNOSIS — H6063 Unspecified chronic otitis externa, bilateral: Secondary | ICD-10-CM | POA: Diagnosis not present

## 2015-12-07 DIAGNOSIS — H903 Sensorineural hearing loss, bilateral: Secondary | ICD-10-CM | POA: Diagnosis not present

## 2015-12-07 DIAGNOSIS — H6062 Unspecified chronic otitis externa, left ear: Secondary | ICD-10-CM | POA: Diagnosis not present

## 2015-12-07 DIAGNOSIS — H6063 Unspecified chronic otitis externa, bilateral: Secondary | ICD-10-CM | POA: Diagnosis not present

## 2015-12-19 DIAGNOSIS — H43811 Vitreous degeneration, right eye: Secondary | ICD-10-CM | POA: Diagnosis not present

## 2015-12-19 DIAGNOSIS — H43812 Vitreous degeneration, left eye: Secondary | ICD-10-CM | POA: Diagnosis not present

## 2016-01-02 DIAGNOSIS — H43812 Vitreous degeneration, left eye: Secondary | ICD-10-CM | POA: Diagnosis not present

## 2016-01-10 ENCOUNTER — Other Ambulatory Visit: Payer: Self-pay | Admitting: Internal Medicine

## 2016-01-10 DIAGNOSIS — R918 Other nonspecific abnormal finding of lung field: Secondary | ICD-10-CM

## 2016-01-16 ENCOUNTER — Ambulatory Visit
Admission: RE | Admit: 2016-01-16 | Discharge: 2016-01-16 | Disposition: A | Discharge status: Home / Self Care | Payer: Medicare Other | Source: Ambulatory Visit | Attending: Internal Medicine | Admitting: Internal Medicine

## 2016-01-16 DIAGNOSIS — R918 Other nonspecific abnormal finding of lung field: Secondary | ICD-10-CM

## 2016-01-25 DIAGNOSIS — H6063 Unspecified chronic otitis externa, bilateral: Secondary | ICD-10-CM | POA: Diagnosis not present

## 2016-01-25 DIAGNOSIS — H903 Sensorineural hearing loss, bilateral: Secondary | ICD-10-CM | POA: Diagnosis not present

## 2016-01-25 DIAGNOSIS — H6062 Unspecified chronic otitis externa, left ear: Secondary | ICD-10-CM | POA: Diagnosis not present

## 2016-02-29 DIAGNOSIS — H6062 Unspecified chronic otitis externa, left ear: Secondary | ICD-10-CM | POA: Diagnosis not present

## 2016-02-29 DIAGNOSIS — H6063 Unspecified chronic otitis externa, bilateral: Secondary | ICD-10-CM | POA: Diagnosis not present

## 2016-02-29 DIAGNOSIS — H903 Sensorineural hearing loss, bilateral: Secondary | ICD-10-CM | POA: Diagnosis not present

## 2016-03-26 DIAGNOSIS — Z111 Encounter for screening for respiratory tuberculosis: Secondary | ICD-10-CM | POA: Diagnosis not present

## 2016-05-02 DIAGNOSIS — H6062 Unspecified chronic otitis externa, left ear: Secondary | ICD-10-CM | POA: Diagnosis not present

## 2016-05-02 DIAGNOSIS — H903 Sensorineural hearing loss, bilateral: Secondary | ICD-10-CM | POA: Diagnosis not present

## 2016-05-02 DIAGNOSIS — H6063 Unspecified chronic otitis externa, bilateral: Secondary | ICD-10-CM | POA: Diagnosis not present

## 2016-05-09 DIAGNOSIS — H2513 Age-related nuclear cataract, bilateral: Secondary | ICD-10-CM | POA: Diagnosis not present

## 2016-05-09 DIAGNOSIS — H5213 Myopia, bilateral: Secondary | ICD-10-CM | POA: Diagnosis not present

## 2016-05-09 DIAGNOSIS — H43813 Vitreous degeneration, bilateral: Secondary | ICD-10-CM | POA: Diagnosis not present

## 2016-05-09 DIAGNOSIS — H25043 Posterior subcapsular polar age-related cataract, bilateral: Secondary | ICD-10-CM | POA: Diagnosis not present

## 2016-06-25 DIAGNOSIS — H903 Sensorineural hearing loss, bilateral: Secondary | ICD-10-CM | POA: Diagnosis not present

## 2016-06-25 DIAGNOSIS — H6063 Unspecified chronic otitis externa, bilateral: Secondary | ICD-10-CM | POA: Diagnosis not present

## 2016-06-25 DIAGNOSIS — H6062 Unspecified chronic otitis externa, left ear: Secondary | ICD-10-CM | POA: Diagnosis not present

## 2016-08-15 DIAGNOSIS — H6062 Unspecified chronic otitis externa, left ear: Secondary | ICD-10-CM | POA: Diagnosis not present

## 2016-08-15 DIAGNOSIS — H903 Sensorineural hearing loss, bilateral: Secondary | ICD-10-CM | POA: Diagnosis not present

## 2016-08-15 DIAGNOSIS — H6041 Cholesteatoma of right external ear: Secondary | ICD-10-CM | POA: Diagnosis not present

## 2016-08-15 DIAGNOSIS — H6063 Unspecified chronic otitis externa, bilateral: Secondary | ICD-10-CM | POA: Diagnosis not present

## 2016-09-10 DIAGNOSIS — I10 Essential (primary) hypertension: Secondary | ICD-10-CM | POA: Diagnosis not present

## 2016-09-10 DIAGNOSIS — Z125 Encounter for screening for malignant neoplasm of prostate: Secondary | ICD-10-CM | POA: Diagnosis not present

## 2016-09-10 DIAGNOSIS — E784 Other hyperlipidemia: Secondary | ICD-10-CM | POA: Diagnosis not present

## 2016-09-17 DIAGNOSIS — E784 Other hyperlipidemia: Secondary | ICD-10-CM | POA: Diagnosis not present

## 2016-09-17 DIAGNOSIS — R222 Localized swelling, mass and lump, trunk: Secondary | ICD-10-CM | POA: Diagnosis not present

## 2016-09-17 DIAGNOSIS — I10 Essential (primary) hypertension: Secondary | ICD-10-CM | POA: Diagnosis not present

## 2016-09-17 DIAGNOSIS — F17200 Nicotine dependence, unspecified, uncomplicated: Secondary | ICD-10-CM | POA: Diagnosis not present

## 2016-09-17 DIAGNOSIS — D126 Benign neoplasm of colon, unspecified: Secondary | ICD-10-CM | POA: Diagnosis not present

## 2016-09-17 DIAGNOSIS — J449 Chronic obstructive pulmonary disease, unspecified: Secondary | ICD-10-CM | POA: Diagnosis not present

## 2016-09-17 DIAGNOSIS — Z1389 Encounter for screening for other disorder: Secondary | ICD-10-CM | POA: Diagnosis not present

## 2016-09-17 DIAGNOSIS — M545 Low back pain: Secondary | ICD-10-CM | POA: Diagnosis not present

## 2016-09-17 DIAGNOSIS — K5730 Diverticulosis of large intestine without perforation or abscess without bleeding: Secondary | ICD-10-CM | POA: Diagnosis not present

## 2016-09-17 DIAGNOSIS — Z6822 Body mass index (BMI) 22.0-22.9, adult: Secondary | ICD-10-CM | POA: Diagnosis not present

## 2016-09-17 DIAGNOSIS — Z0000 Encounter for general adult medical examination without abnormal findings: Secondary | ICD-10-CM | POA: Diagnosis not present

## 2016-09-17 DIAGNOSIS — M1990 Unspecified osteoarthritis, unspecified site: Secondary | ICD-10-CM | POA: Diagnosis not present

## 2016-10-15 DIAGNOSIS — H6062 Unspecified chronic otitis externa, left ear: Secondary | ICD-10-CM | POA: Diagnosis not present

## 2016-10-15 DIAGNOSIS — H903 Sensorineural hearing loss, bilateral: Secondary | ICD-10-CM | POA: Diagnosis not present

## 2016-10-15 DIAGNOSIS — H7201 Central perforation of tympanic membrane, right ear: Secondary | ICD-10-CM | POA: Diagnosis not present

## 2016-10-15 DIAGNOSIS — H6063 Unspecified chronic otitis externa, bilateral: Secondary | ICD-10-CM | POA: Diagnosis not present

## 2016-10-26 DIAGNOSIS — R319 Hematuria, unspecified: Secondary | ICD-10-CM | POA: Insufficient documentation

## 2016-10-26 DIAGNOSIS — R3129 Other microscopic hematuria: Secondary | ICD-10-CM | POA: Diagnosis not present

## 2016-10-29 DIAGNOSIS — H7201 Central perforation of tympanic membrane, right ear: Secondary | ICD-10-CM | POA: Diagnosis not present

## 2016-10-29 DIAGNOSIS — H663X1 Other chronic suppurative otitis media, right ear: Secondary | ICD-10-CM | POA: Diagnosis not present

## 2016-10-29 DIAGNOSIS — H6063 Unspecified chronic otitis externa, bilateral: Secondary | ICD-10-CM | POA: Diagnosis not present

## 2016-10-29 DIAGNOSIS — H903 Sensorineural hearing loss, bilateral: Secondary | ICD-10-CM | POA: Diagnosis not present

## 2016-11-21 DIAGNOSIS — H6063 Unspecified chronic otitis externa, bilateral: Secondary | ICD-10-CM | POA: Diagnosis not present

## 2016-11-21 DIAGNOSIS — H7201 Central perforation of tympanic membrane, right ear: Secondary | ICD-10-CM | POA: Diagnosis not present

## 2016-11-21 DIAGNOSIS — H6062 Unspecified chronic otitis externa, left ear: Secondary | ICD-10-CM | POA: Diagnosis not present

## 2016-11-21 DIAGNOSIS — H663X1 Other chronic suppurative otitis media, right ear: Secondary | ICD-10-CM | POA: Diagnosis not present

## 2016-11-21 DIAGNOSIS — H6041 Cholesteatoma of right external ear: Secondary | ICD-10-CM | POA: Diagnosis not present

## 2016-11-21 DIAGNOSIS — H903 Sensorineural hearing loss, bilateral: Secondary | ICD-10-CM | POA: Diagnosis not present

## 2016-12-12 DIAGNOSIS — R310 Gross hematuria: Secondary | ICD-10-CM | POA: Diagnosis not present

## 2016-12-19 DIAGNOSIS — R319 Hematuria, unspecified: Secondary | ICD-10-CM | POA: Diagnosis not present

## 2016-12-19 DIAGNOSIS — N200 Calculus of kidney: Secondary | ICD-10-CM | POA: Diagnosis not present

## 2016-12-19 DIAGNOSIS — R310 Gross hematuria: Secondary | ICD-10-CM | POA: Diagnosis not present

## 2016-12-26 DIAGNOSIS — R310 Gross hematuria: Secondary | ICD-10-CM | POA: Diagnosis not present

## 2017-01-14 DIAGNOSIS — H903 Sensorineural hearing loss, bilateral: Secondary | ICD-10-CM | POA: Diagnosis not present

## 2017-01-14 DIAGNOSIS — H6062 Unspecified chronic otitis externa, left ear: Secondary | ICD-10-CM | POA: Diagnosis not present

## 2017-01-14 DIAGNOSIS — H663X1 Other chronic suppurative otitis media, right ear: Secondary | ICD-10-CM | POA: Diagnosis not present

## 2017-01-14 DIAGNOSIS — H7201 Central perforation of tympanic membrane, right ear: Secondary | ICD-10-CM | POA: Diagnosis not present

## 2017-01-14 DIAGNOSIS — H6063 Unspecified chronic otitis externa, bilateral: Secondary | ICD-10-CM | POA: Diagnosis not present

## 2017-01-14 DIAGNOSIS — H6041 Cholesteatoma of right external ear: Secondary | ICD-10-CM | POA: Diagnosis not present

## 2017-01-16 ENCOUNTER — Other Ambulatory Visit: Payer: Self-pay | Admitting: Otolaryngology

## 2017-01-16 DIAGNOSIS — H663X1 Other chronic suppurative otitis media, right ear: Secondary | ICD-10-CM

## 2017-01-16 DIAGNOSIS — H7201 Central perforation of tympanic membrane, right ear: Secondary | ICD-10-CM

## 2017-01-21 ENCOUNTER — Other Ambulatory Visit: Payer: Medicare Other

## 2017-01-21 ENCOUNTER — Ambulatory Visit
Admission: RE | Admit: 2017-01-21 | Discharge: 2017-01-21 | Disposition: A | Discharge status: Home / Self Care | Payer: Medicare Other | Source: Ambulatory Visit | Attending: Otolaryngology | Admitting: Otolaryngology

## 2017-01-21 DIAGNOSIS — H7201 Central perforation of tympanic membrane, right ear: Secondary | ICD-10-CM

## 2017-01-21 DIAGNOSIS — H6041 Cholesteatoma of right external ear: Secondary | ICD-10-CM | POA: Diagnosis not present

## 2017-01-21 DIAGNOSIS — H7011 Chronic mastoiditis, right ear: Secondary | ICD-10-CM | POA: Diagnosis not present

## 2017-01-21 DIAGNOSIS — H6063 Unspecified chronic otitis externa, bilateral: Secondary | ICD-10-CM | POA: Diagnosis not present

## 2017-01-21 DIAGNOSIS — H6062 Unspecified chronic otitis externa, left ear: Secondary | ICD-10-CM | POA: Diagnosis not present

## 2017-01-21 DIAGNOSIS — H663X1 Other chronic suppurative otitis media, right ear: Secondary | ICD-10-CM

## 2017-01-21 DIAGNOSIS — H6501 Acute serous otitis media, right ear: Secondary | ICD-10-CM | POA: Diagnosis not present

## 2017-01-21 DIAGNOSIS — H903 Sensorineural hearing loss, bilateral: Secondary | ICD-10-CM | POA: Diagnosis not present

## 2017-01-21 MED ORDER — IOPAMIDOL (ISOVUE-300) INJECTION 61%
75.0000 mL | Freq: Once | INTRAVENOUS | Status: AC | PRN
  Administered 2017-01-21: 75 mL via INTRAVENOUS

## 2017-01-28 ENCOUNTER — Other Ambulatory Visit: Payer: Self-pay | Admitting: Internal Medicine

## 2017-01-28 DIAGNOSIS — R222 Localized swelling, mass and lump, trunk: Secondary | ICD-10-CM

## 2017-02-13 ENCOUNTER — Ambulatory Visit
Admission: RE | Admit: 2017-02-13 | Discharge: 2017-02-13 | Disposition: A | Discharge status: Home / Self Care | Payer: Medicare Other | Source: Ambulatory Visit | Attending: Internal Medicine | Admitting: Internal Medicine

## 2017-02-13 DIAGNOSIS — R222 Localized swelling, mass and lump, trunk: Secondary | ICD-10-CM

## 2017-02-13 DIAGNOSIS — R918 Other nonspecific abnormal finding of lung field: Secondary | ICD-10-CM | POA: Diagnosis not present

## 2017-02-13 MED ORDER — IOPAMIDOL (ISOVUE-300) INJECTION 61%
75.0000 mL | Freq: Once | INTRAVENOUS | Status: AC | PRN
  Administered 2017-02-13: 75 mL via INTRAVENOUS

## 2017-02-15 ENCOUNTER — Other Ambulatory Visit (HOSPITAL_COMMUNITY): Payer: Self-pay | Admitting: Internal Medicine

## 2017-02-15 DIAGNOSIS — R918 Other nonspecific abnormal finding of lung field: Secondary | ICD-10-CM

## 2017-02-18 DIAGNOSIS — I7090 Unspecified atherosclerosis: Secondary | ICD-10-CM | POA: Insufficient documentation

## 2017-02-20 DIAGNOSIS — H6062 Unspecified chronic otitis externa, left ear: Secondary | ICD-10-CM | POA: Diagnosis not present

## 2017-02-20 DIAGNOSIS — H6063 Unspecified chronic otitis externa, bilateral: Secondary | ICD-10-CM | POA: Diagnosis not present

## 2017-02-20 DIAGNOSIS — H7201 Central perforation of tympanic membrane, right ear: Secondary | ICD-10-CM | POA: Diagnosis not present

## 2017-02-20 DIAGNOSIS — H6041 Cholesteatoma of right external ear: Secondary | ICD-10-CM | POA: Diagnosis not present

## 2017-02-20 DIAGNOSIS — H7011 Chronic mastoiditis, right ear: Secondary | ICD-10-CM | POA: Diagnosis not present

## 2017-02-20 DIAGNOSIS — H906 Mixed conductive and sensorineural hearing loss, bilateral: Secondary | ICD-10-CM | POA: Diagnosis not present

## 2017-03-06 ENCOUNTER — Encounter (HOSPITAL_COMMUNITY)
Admission: RE | Admit: 2017-03-06 | Discharge: 2017-03-06 | Disposition: A | Discharge status: Home / Self Care | Payer: Medicare Other | Source: Ambulatory Visit | Attending: Internal Medicine | Admitting: Internal Medicine

## 2017-03-06 DIAGNOSIS — R918 Other nonspecific abnormal finding of lung field: Secondary | ICD-10-CM | POA: Diagnosis not present

## 2017-03-06 DIAGNOSIS — R911 Solitary pulmonary nodule: Secondary | ICD-10-CM | POA: Diagnosis not present

## 2017-03-06 LAB — GLUCOSE, CAPILLARY: GLUCOSE-CAPILLARY: 112 mg/dL — AB (ref 65–99)

## 2017-03-06 MED ORDER — FLUDEOXYGLUCOSE F - 18 (FDG) INJECTION
7.7900 | Freq: Once | INTRAVENOUS | Status: AC | PRN
  Administered 2017-03-06: 7.79 via INTRAVENOUS

## 2017-03-09 ENCOUNTER — Encounter: Payer: Self-pay | Admitting: *Deleted

## 2017-03-09 DIAGNOSIS — R911 Solitary pulmonary nodule: Secondary | ICD-10-CM

## 2017-03-09 NOTE — Progress Notes (Signed)
Oncology Nurse Navigator Documentation  Oncology Nurse Navigator Flowsheets 03/09/2017  Navigator Location [facility]  Referral date to RadOnc/MedOnc 03/09/2017  Navigator Encounter Type Other/I received referral from Dr. Silvestre Mesi office. I completed referral to T surgery.   There is no [facility] clinic this week and next week is full.  I did not want the patient to wait 3 weeks to be seen so made referral to T surgery.   Barriers/Navigation Needs Coordination of Care  Interventions Coordination of Care  Acuity Level 1  Time Spent with Patient 15

## 2017-03-13 ENCOUNTER — Other Ambulatory Visit: Payer: Self-pay | Admitting: *Deleted

## 2017-03-20 DIAGNOSIS — H906 Mixed conductive and sensorineural hearing loss, bilateral: Secondary | ICD-10-CM | POA: Diagnosis not present

## 2017-03-20 DIAGNOSIS — H663X1 Other chronic suppurative otitis media, right ear: Secondary | ICD-10-CM | POA: Diagnosis not present

## 2017-03-20 DIAGNOSIS — H7201 Central perforation of tympanic membrane, right ear: Secondary | ICD-10-CM | POA: Diagnosis not present

## 2017-03-20 DIAGNOSIS — H7011 Chronic mastoiditis, right ear: Secondary | ICD-10-CM | POA: Diagnosis not present

## 2017-03-20 DIAGNOSIS — H6063 Unspecified chronic otitis externa, bilateral: Secondary | ICD-10-CM | POA: Diagnosis not present

## 2017-03-21 ENCOUNTER — Encounter: Payer: Medicare Other | Admitting: Thoracic Surgery (Cardiothoracic Vascular Surgery)

## 2017-03-25 ENCOUNTER — Encounter: Payer: Self-pay | Admitting: Thoracic Surgery (Cardiothoracic Vascular Surgery)

## 2017-03-25 ENCOUNTER — Other Ambulatory Visit: Payer: Self-pay

## 2017-03-25 ENCOUNTER — Institutional Professional Consult (permissible substitution) (INDEPENDENT_AMBULATORY_CARE_PROVIDER_SITE_OTHER): Payer: Medicare Other | Admitting: Thoracic Surgery (Cardiothoracic Vascular Surgery)

## 2017-03-25 VITALS — BP 123/78 | HR 85 | Resp 18 | Ht 72.0 in | Wt 157.0 lb

## 2017-03-25 DIAGNOSIS — R911 Solitary pulmonary nodule: Secondary | ICD-10-CM

## 2017-03-25 NOTE — Progress Notes (Signed)
PCP is Crist Infante, MD Referring Provider is Crist Infante, MD  Chief Complaint  Patient presents with  . New Patient (Initial Visit)    Lung nodule PET 03/06/2017, CT 02/13/2017    HPI: Mr. Dylan Harvey is a 67 year old gentleman sent for consultation regarding a left upper lobe lung nodule.  Mr. 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  Started smoking as a teenager.  He smoked for about 50 years.  His maximal is about 2 packs a day.  He says currently he has less than a pack a day.  He has had CT scans going back to about 2013.  He had a nodule seen on a chest x-ray.  He said multiple nodules that have been stable.  He recently had a CT for a one year follow-up.  It showed a new 6 mm nodule in the left upper lobe posteriorly.  The other nodules were stable.  A PET/CT was done which showed mild uptake with an SUV of 1.6.  He feels well.  He denies any chest pain, pressure, or tightness at rest or with exertion.  He denies shortness of breath.  He can walk 2 flights of stairs without difficulty.  He denies cough, hemoptysis, and wheezing.  He has not had a significant change in appetite or weight loss.  Zubrod Score: At the time of surgery this patient's most appropriate activity status/level should be described as: [x]     0    Normal activity, no symptoms []     1    Restricted in physical strenuous activity but ambulatory, able to do out light work []     2    Ambulatory and capable of self care, unable to do work activities, up and about >50 % of waking hours                              []     3    Only limited self care, in bed greater than 50% of waking hours []     4    Completely disabled, no self care, confined to bed or chair []     5    Moribund  Past Medical History:  Diagnosis Date  . Allergy   . Arthritis    thumbs  . History of colon polyps   . Hypertension     Past Surgical History:  Procedure  Laterality Date  . COLONOSCOPY    . HERNIA REPAIR     inguinal  . POLYPECTOMY      Family History  Problem Relation Age of Onset  . Colon cancer Neg Hx   . Colon polyps Neg Hx   . Esophageal cancer Neg Hx   . Rectal cancer Neg Hx   . Stomach cancer Neg Hx     Social History Social History   Tobacco Use  . Smoking status: Current Every Day Smoker    Packs/day: 1.00    Types: Cigarettes  . Smokeless tobacco: Never Used  Substance Use Topics  . Alcohol use: Yes    Alcohol/week: 1.2 oz    Types: 2 Shots of liquor per week    Comment: 2 drinks a weeek   . Drug use: No  75-pack-year history of smoking.  Started age 45.  Max 2 ppd.  Current 1 ppd.  Current Outpatient Medications  Medication Sig Dispense Refill  . Alfalfa 500  MG TABS Take by mouth. 4 tabs at lunch, 4 tabs dinner, 5 last evening    . b complex vitamins tablet Take 1 tablet by mouth daily.    . cetirizine (ZYRTEC) 10 MG tablet Take 10 mg by mouth daily.    . Chloramphenicol POWD by Does not apply route 2 (two) times a week. Wed and Saturday - apply two puffs in each ear twice a week    . cholecalciferol (VITAMIN D) 1000 units tablet Take 1,000 Units by mouth daily.    . Flaxseed, Linseed, (RA FLAX SEED OIL 1000 PO) Take 1 capsule by mouth daily.     . Lecithin 1200 MG CAPS Take 1,200 mg by mouth daily.    Marland Kitchen lisinopril (PRINIVIL,ZESTRIL) 10 MG tablet Take 5 mg by mouth daily.     . Multiple Vitamin (MULTIVITAMIN WITH MINERALS) TABS Take 1 tablet by mouth daily.    . NEOMYCIN-POLYMYXIN-HYDROCORTISONE (CORTISPORIN) 1 % SOLN OTIC solution 3 DROPS IN LEFT EAR 3 TIMES/DAY X 1 WK  1  . Omega-3 Fatty Acids (FISH OIL) 1200 MG CAPS Take 1,200 mg by mouth daily.    Marland Kitchen OVER THE COUNTER MEDICATION Take 500 mg by mouth daily. 1 daily green tea    . OVER THE COUNTER MEDICATION Tumeric 500 mg daily    . POTASSIUM GLUCONATE PO Take 99 mg by mouth daily.    . Probiotic Product (PROBIOTIC PO) Take 1 capsule by mouth daily. 20  billion with 4 strains    . PSYLLIUM HUSK PO Take by mouth. 4 tabs lunch, 4 dinner, 4 one hour before bed    . Selenium 200 MCG CAPS Take 200 mcg by mouth 2 (two) times a week.     . sodium chloride (OCEAN) 0.65 % SOLN nasal spray Place 1 spray into the nose daily.    . vitamin C (ASCORBIC ACID) 500 MG tablet Take 500 mg by mouth daily.    Marland Kitchen zinc gluconate 50 MG tablet Take 50 mg by mouth daily.     No current facility-administered medications for this visit.     Allergies  Allergen Reactions  . Neomycin-Bacitracin Zn-Polymyx Swelling    REACTION: rash    Review of Systems  Constitutional: Negative for activity change, appetite change and unexpected weight change.  HENT: Positive for hearing loss. Negative for dental problem, trouble swallowing and voice change.   Eyes: Negative for visual disturbance.  Respiratory: Negative for cough, shortness of breath and wheezing.   Cardiovascular: Negative for chest pain and leg swelling.  Gastrointestinal: Negative for abdominal distention and abdominal pain.  Genitourinary: Negative for difficulty urinating and dysuria.  Musculoskeletal: Negative for arthralgias and back pain.  Neurological: Negative for dizziness, syncope and weakness.  Hematological: Negative for adenopathy. Does not bruise/bleed easily.    BP 123/78 (BP Location: Right Arm, Patient Position: Sitting, Cuff Size: Large)   Pulse 85   Resp 18   Ht 6' (1.829 m)   Wt 157 lb (71.2 kg)   SpO2 98% Comment: RA  BMI 21.29 kg/m  Physical Exam  Constitutional: He is oriented to person, place, and time. He appears well-developed and well-nourished. No distress.  HENT:  Head: Normocephalic and atraumatic.  Mouth/Throat: No oropharyngeal exudate.  Eyes: Conjunctivae and EOM are normal. No scleral icterus.  Neck: Neck supple. No thyromegaly present.  Cardiovascular: Normal rate, regular rhythm, normal heart sounds and intact distal pulses. Exam reveals no gallop and no friction  rub.  No murmur heard. Pulmonary/Chest: Effort  normal and breath sounds normal. No respiratory distress. He has no wheezes. He has no rales.  Abdominal: Soft. He exhibits no distension. There is no tenderness.  Musculoskeletal: He exhibits no edema.  Lymphadenopathy:    He has no cervical adenopathy.  Neurological: He is alert and oriented to person, place, and time. No cranial nerve deficit. He exhibits normal muscle tone.  Skin: Skin is warm and dry.  Vitals reviewed.    Diagnostic Tests: CT CHEST WITH CONTRAST  TECHNIQUE: Multidetector CT imaging of the chest was performed during intravenous contrast administration.  CONTRAST:  34mL ISOVUE-300 IOPAMIDOL (ISOVUE-300) INJECTION 61%  COMPARISON:  01/16/2016  FINDINGS: Cardiovascular: Thoracic aortic atherosclerosis evident without significant aneurysm, dissection, mediastinal hemorrhage or hematoma. Patent 3 vessel arch anatomy. Visualized central pulmonary arteries are patent. Normal heart size. Native coronary atherosclerosis noted. No pericardial effusion.  Mediastinum/Nodes: Thyroid, trachea and esophagus unremarkable several small hiatal hernia. No adenopathy.  Lungs/Pleura: Similar moderate centrilobular emphysema with scattered subpleural blebs.  There is a new 6 mm mildly spiculated pulmonary nodule in the left upper lobe posteriorly along the major fissure, image 33 remaining indeterminate.  Stable 4 mm pulmonary nodule in the left lower lobe laterally, image 90 findings.  Additional subcentimeter left lower lobe pulmonary nodules, image 124 and image 137 are also stable, measuring 5 mm or less in size.  No pulmonary nodules of significance demonstrated in the right lung.  Minor bibasilar atelectasis.  Trachea central airways remain patent.  No pleural abnormality, effusion, or pneumothorax appear  Upper Abdomen: No acute abnormality.  Musculoskeletal: Diffuse thoracic spondylosis. No  acute osseous finding. Sternum intact.  IMPRESSION: The previously described tiny left lower lobe scattered pulmonary nodules all measure 5 mm or less in size and are stable.  New posterior left upper lobe mildly spiculated 6 mm nodule has developed since 01/16/2016. Non-contrast chest CT at 6-12 months is recommended. If the nodule is stable at time of repeat CT, then future CT at 18-24 months (from today's scan) is considered optional for low-risk patients, but is recommended for high-risk patients. This recommendation follows the consensus statement: Guidelines for Management of Incidental Pulmonary Nodules Detected on CT Images: From the Fleischner Society 2017; Radiology 2017; 284:228-243.  Stable pulmonary emphysema  No superimposed acute intrathoracic process  Aortic Atherosclerosis (ICD10-I70.0) and Emphysema (ICD10-J43.9).   Electronically Signed   By: Jerilynn Mages.  Shick M.D.   On: 02/13/2017 18:37 NUCLEAR MEDICINE PET SKULL BASE TO THIGH  TECHNIQUE: 7.8 mCi F-18 FDG was injected intravenously. Full-ring PET imaging was performed from the skull base to thigh after the radiotracer. CT data was obtained and used for attenuation correction and anatomic localization.  FASTING BLOOD GLUCOSE:  Value: 112 mg/dl  COMPARISON:  CT 02/05/2017, 01/17/2016  FINDINGS: NECK  No hypermetabolic lymph nodes in the neck.  CHEST  In the posterosuperior segment of the LEFT upper lobe, again demonstrated 6 mm nodule (image 18, series 8). The nodule is not changed in size from 6 mm on comparison exam although the density appears slightly less (differing CT technique between the 2 studies). The lesion has low but measurable metabolic activity with SUV max equal 1.6.  No additional suspicious pulmonary nodules.  Centrilobular emphysema in the upper lobes.  No hypermetabolic or enlarged mediastinal lymph nodes. Coronary artery calcifications are  present.  ABDOMEN/PELVIS  No abnormal metabolic activity in the liver. Normal adrenal glands. Calcified periportal lymph nodes. Benign cysts of the RIGHT kidney. Normal pancreas.  Multiple diverticula of the sigmoid colon  without acute inflammation.  No hypermetabolic abdominopelvic lymph nodes.  SKELETON  No focal hypermetabolic activity to suggest skeletal metastasis.  IMPRESSION: 1. Low metabolic activity associated with the LEFT upper lobe nodule is indeterminate but concerning. As the lesion is new on most recent comparison exam and there has been only short interval (< 1 month ) between exams recommend, single follow-up CT scan in 1 to 2 months from current month to determine if lesion persists. If lesion persist or enlarges, recommend tissue sampling. 2. Centrilobular emphysema. 3. Calcified periportal lymph nodes suggest prior granulomatous disease. 4. Sigmoid diverticulosis without evidence diverticulitis 5. Coronary artery calcification and Aortic Atherosclerosis (ICD10-I70.0).   Electronically Signed   By: Suzy Bouchard M.D.   On: 03/06/2017 14:58 I personally reviewed the CT and PET/CT images and concur with the findings noted above  Impression: Mr. Dylan Harvey is a 67 year old smoker who has a new 6 mm spiculated nodule in the left upper lobe posteriorly.  This nodule did have some activity on PET.  The SUV was low at 1.6, but for a nodule that size that is suspicious.  The leading differential is an adenocarcinoma.  Infectious or inflammatory nodules could have a similar appearance.  This nodule is too small to attempt a biopsy percutaneously or bronchoscopically.  I discussed 2 options with Mr. Dylan Harvey.  One is radiographic follow-up.  We would want to rescan him again future.  Is been about 3 weeks since his PET so I would wait another 5 weeks that would be 8 weeks from the patent 12 weeks from his original CT.  The second option is surgical resection.   We could do this with a VATS approach to but it is still an operation.  He prefers radiographic follow-up at this point.  Tobacco abuse- Smoking cessation instruction/counseling given:  counseled patient on the dangers of tobacco use, advised patient to stop smoking, and reviewed strategies to maximize success   Coronary and aortic atherosclerosis-calcifications noted on CT.  Asymptomatic at present.  I emphasized this is another reason to quit smoking.  Centrilobular emphysema-another CT finding.  Not currently symptomatic.  He would need pulmonary function testing should we ultimately need to do surgery.  Plan: Return in 5 weeks with CT chest (3 months from original scan, 2 months from PET) to follow-up left upper lobe nodule.  Melrose Nakayama, MD [facility] [phone]

## 2017-04-04 ENCOUNTER — Other Ambulatory Visit: Payer: Self-pay | Admitting: Thoracic Surgery (Cardiothoracic Vascular Surgery)

## 2017-04-04 DIAGNOSIS — R918 Other nonspecific abnormal finding of lung field: Secondary | ICD-10-CM

## 2017-04-17 DIAGNOSIS — H906 Mixed conductive and sensorineural hearing loss, bilateral: Secondary | ICD-10-CM | POA: Diagnosis not present

## 2017-04-17 DIAGNOSIS — H663X1 Other chronic suppurative otitis media, right ear: Secondary | ICD-10-CM | POA: Diagnosis not present

## 2017-04-17 DIAGNOSIS — H6063 Unspecified chronic otitis externa, bilateral: Secondary | ICD-10-CM | POA: Diagnosis not present

## 2017-04-17 DIAGNOSIS — H7011 Chronic mastoiditis, right ear: Secondary | ICD-10-CM | POA: Diagnosis not present

## 2017-04-17 DIAGNOSIS — H7201 Central perforation of tympanic membrane, right ear: Secondary | ICD-10-CM | POA: Diagnosis not present

## 2017-04-22 DIAGNOSIS — F17200 Nicotine dependence, unspecified, uncomplicated: Secondary | ICD-10-CM | POA: Diagnosis not present

## 2017-04-22 DIAGNOSIS — Z6822 Body mass index (BMI) 22.0-22.9, adult: Secondary | ICD-10-CM | POA: Diagnosis not present

## 2017-04-22 DIAGNOSIS — I10 Essential (primary) hypertension: Secondary | ICD-10-CM | POA: Diagnosis not present

## 2017-04-22 DIAGNOSIS — H663X1 Other chronic suppurative otitis media, right ear: Secondary | ICD-10-CM | POA: Diagnosis not present

## 2017-04-22 DIAGNOSIS — E7849 Other hyperlipidemia: Secondary | ICD-10-CM | POA: Diagnosis not present

## 2017-04-22 DIAGNOSIS — Z01818 Encounter for other preprocedural examination: Secondary | ICD-10-CM | POA: Diagnosis not present

## 2017-04-22 DIAGNOSIS — J449 Chronic obstructive pulmonary disease, unspecified: Secondary | ICD-10-CM | POA: Diagnosis not present

## 2017-04-22 DIAGNOSIS — H7290 Unspecified perforation of tympanic membrane, unspecified ear: Secondary | ICD-10-CM | POA: Insufficient documentation

## 2017-04-22 DIAGNOSIS — H7291 Unspecified perforation of tympanic membrane, right ear: Secondary | ICD-10-CM | POA: Diagnosis not present

## 2017-04-22 DIAGNOSIS — H60501 Unspecified acute noninfective otitis externa, right ear: Secondary | ICD-10-CM | POA: Diagnosis not present

## 2017-04-29 ENCOUNTER — Other Ambulatory Visit: Payer: Self-pay | Admitting: Otolaryngology

## 2017-04-29 DIAGNOSIS — H663X1 Other chronic suppurative otitis media, right ear: Secondary | ICD-10-CM | POA: Diagnosis not present

## 2017-04-29 DIAGNOSIS — H7011 Chronic mastoiditis, right ear: Secondary | ICD-10-CM | POA: Diagnosis not present

## 2017-04-29 DIAGNOSIS — H7201 Central perforation of tympanic membrane, right ear: Secondary | ICD-10-CM | POA: Diagnosis not present

## 2017-04-29 DIAGNOSIS — H9011 Conductive hearing loss, unilateral, right ear, with unrestricted hearing on the contralateral side: Secondary | ICD-10-CM | POA: Diagnosis not present

## 2017-04-29 DIAGNOSIS — H906 Mixed conductive and sensorineural hearing loss, bilateral: Secondary | ICD-10-CM | POA: Diagnosis not present

## 2017-04-29 DIAGNOSIS — H663X3 Other chronic suppurative otitis media, bilateral: Secondary | ICD-10-CM | POA: Diagnosis not present

## 2017-05-07 ENCOUNTER — Ambulatory Visit
Admission: RE | Admit: 2017-05-07 | Discharge: 2017-05-07 | Disposition: A | Discharge status: Home / Self Care | Payer: Medicare Other | Source: Ambulatory Visit | Attending: Thoracic Surgery (Cardiothoracic Vascular Surgery) | Admitting: Thoracic Surgery (Cardiothoracic Vascular Surgery)

## 2017-05-07 ENCOUNTER — Ambulatory Visit (INDEPENDENT_AMBULATORY_CARE_PROVIDER_SITE_OTHER): Payer: Medicare Other | Admitting: Thoracic Surgery (Cardiothoracic Vascular Surgery)

## 2017-05-07 VITALS — BP 125/75 | HR 76 | Resp 20 | Ht 72.0 in | Wt 155.0 lb

## 2017-05-07 DIAGNOSIS — R918 Other nonspecific abnormal finding of lung field: Secondary | ICD-10-CM | POA: Diagnosis not present

## 2017-05-07 DIAGNOSIS — R911 Solitary pulmonary nodule: Secondary | ICD-10-CM

## 2017-05-07 NOTE — Progress Notes (Signed)
[address]       [place] [address]             [phone]     HPI: Dylan Harvey returns for follow-up of a left upper lobe lung nodule  Dylan Harvey he is a 67 year old man with a history of tobacco abuse, hypertension, arthritis, and colonic polyps.  He had a lung nodule seen on chest x-ray back in 2013.  He has been followed with CTs since that time.  He said multiple nodules that were stable.  On his CT back in December there was a new 6 mm nodule in the left upper lobe.  On PET CT this was mildly hypermetabolic with an SUV of 1.6.  I saw him in January and we discussed surgical resection versus radiographic follow-up.  Given the small size of the nodule I recommended radiographic follow-up.  He agreed to that.  In the interim since his last visit he has had surgery for mastoiditis.  He has not had any chest pain, shortness of breath, wheezing, or cough.  He is still smoking but says he is less than half a pack a day now and is making a conscious effort to quit.  Past Medical History:  Diagnosis Date  . Allergy   . Arthritis    thumbs  . Arthritis   . Colonic adenoma 2009  . ED (erectile dysfunction)   . Emphysema lung (HCC) MILD   . History of colon polyps   . Hyperlipemia   . Hypertension   . Lumbago   . Tobacco abuse     Current Outpatient Medications  Medication Sig Dispense Refill  . Alfalfa 500 MG TABS Take by mouth. 4 tabs at lunch, 4 tabs dinner, 5 last evening    . b complex vitamins tablet Take 1 tablet by mouth daily.    . Chloramphenicol POWD by Does not apply route 2 (two) times a week. Wed and Saturday - apply two puffs in each ear twice a week    . cholecalciferol (VITAMIN D) 1000 units tablet Take 1,000 Units by mouth daily.    . Flaxseed, Linseed, (RA FLAX SEED OIL 1000 PO) Take 1 capsule by mouth daily.     . Lecithin 1200 MG CAPS Take 1,200 mg by mouth daily.    Marland Kitchen lisinopril (PRINIVIL,ZESTRIL) 10 MG tablet Take 5 mg by mouth  daily.     . Multiple Vitamin (MULTIVITAMIN WITH MINERALS) TABS Take 1 tablet by mouth daily.    . NEOMYCIN-POLYMYXIN-HYDROCORTISONE (CORTISPORIN) 1 % SOLN OTIC solution 3 DROPS IN LEFT EAR 3 TIMES/DAY X 1 WK  1  . Omega-3 Fatty Acids (FISH OIL) 1200 MG CAPS Take 1,200 mg by mouth daily.    Marland Kitchen OVER THE COUNTER MEDICATION Take 500 mg by mouth daily. 1 daily green tea    . OVER THE COUNTER MEDICATION Tumeric 500 mg daily    . POTASSIUM GLUCONATE PO Take 99 mg by mouth daily.    . Probiotic Product (PROBIOTIC PO) Take 1 capsule by mouth daily. 20 billion with 4 strains    . PSYLLIUM HUSK PO Take by mouth. 4 tabs lunch, 4 dinner, 4 one hour before bed    . Selenium 200 MCG CAPS Take 200 mcg by mouth 2 (two) times a week.     . sodium chloride (OCEAN) 0.65 % SOLN nasal spray Place 1 spray into the nose daily.    . vitamin C (ASCORBIC ACID) 500  MG tablet Take 500 mg by mouth daily.    Marland Kitchen zinc gluconate 50 MG tablet Take 50 mg by mouth daily.     No current facility-administered medications for this visit.     Physical Exam BP 125/75   Pulse 76   Resp 20   Ht 6' (1.829 m)   Wt 155 lb (70.3 kg)   SpO2 96% Comment: RA  BMI 21.20 kg/m  67 year old man in no acute distress Alert and oriented x3 with no focal deficits Lungs clear with equal breath sounds bilaterally Cardiac regular rate and rhythm normal S1 and S2  Diagnostic Tests: CT CHEST WITHOUT CONTRAST  TECHNIQUE: Multidetector CT imaging of the chest was performed following the standard protocol without IV contrast.  COMPARISON:  Multiple exams, including 02/13/2017 and 02/24/2017  FINDINGS: Cardiovascular: Coronary, aortic arch, and branch vessel atherosclerotic vascular disease.  Mediastinum/Nodes: Right hilar node 1.1 cm in short axis on image 73/2, formerly the same on 02/13/2017. Other smaller paratracheal, AP window, and subcarinal lymph nodes are present.  Lungs/Pleura: Severe centrilobular emphysema.  The  spiculated nodule posteriorly in the left upper lobe is significantly improved, with vague architectural distortion and with the associated nodularity only measuring about 0.3 by 0.4 cm on image 29/5, previously 0.6 by 0.5 cm.  Mild biapical pleuroparenchymal scarring. Small eventration of the right posterior hemidiaphragm containing adipose tissues. 3 mm right lower lobe pulmonary nodule on image 115/5 is essentially stable. 4 by 5 mm left lower lobe pulmonary nodule on image 133/5 common no change from 03/30/2011. 3 mm left lower lobe nodule on image 118/5, likewise unchanged. 3 mm subpleural nodule in the left lower lobe on image 105/5, unchanged. 4 by 3 mm left lower lobe nodule on image 88/5, unchanged.  Upper Abdomen: Several calcified upper abdominal lymph nodes compatible with old granulomatous disease.  Musculoskeletal: Thoracic spondylosis.  IMPRESSION: 1. The spiculated left upper lobe nodule of concern has significantly improved in the interim. Residual nodularity measuring about 0.3 by 0.4 cm (previously 0.6 by 0.5 cm) with some vague architectural distortion in this vicinity. The significant improvement strongly favors a benign etiology. Rarely, small neoplastic lung nodules can outgrow their blood supply causing apparent regression of with turns out to be an actual malignancy; for this reason I do believe this nodule should be followed with noncontrast CT chest in 6 months time. Overall however the appearance today is reassuring. 2. The other small pulmonary nodules are essentially stable from 2013 and considered benign. 3. There is a minimally enlarged right hilar lymph node measuring 1.1 cm in short axis. This appears to be chronic from 2013 and likely a chronic reactive lymph node. 4.  Aortic Atherosclerosis (ICD10-I70.0).  Coronary atherosclerosis. 5.  Emphysema (ICD10-J43.9).   Electronically Signed   By: Dylan Harvey M.D.   On: 05/07/2017  10:16 I personally reviewed the CT images and concur with the findings noted above  Impression: Dylan Harvey is a 67 year old gentleman with a history of tobacco abuse who has multiple small stable nodules.  He recently had a CT which showed a 6-7 mm nodule in the posterior aspect of the left upper lobe.  This area was mildly hypermetabolic on PET/CT.  He now returns with a 31-month interval (from the original CT) follow-up scan.  It shows the nodule has decreased in size down to about 3 mm.  The nodule is probably a small localized area of infection that has resolved (or is resolving).  I do agree with Radiology's  recommendation that we should rescan this nodule in about 6 months just to 648% certain, particularly in light of his history of heavy tobacco abuse.  Smoking cessation instruction/counseling given:  counseled patient on the dangers of tobacco use, advised patient to stop smoking, and reviewed strategies to maximize success.  He has cut back on his smoking but I emphasized the need to quit altogether.  Plan: Return in 6 months with CT chest  Melrose Nakayama, MD [facility] [phone]

## 2017-05-13 DIAGNOSIS — H2513 Age-related nuclear cataract, bilateral: Secondary | ICD-10-CM | POA: Diagnosis not present

## 2017-05-13 DIAGNOSIS — H43813 Vitreous degeneration, bilateral: Secondary | ICD-10-CM | POA: Diagnosis not present

## 2017-05-13 DIAGNOSIS — H5213 Myopia, bilateral: Secondary | ICD-10-CM | POA: Diagnosis not present

## 2017-05-13 DIAGNOSIS — H25043 Posterior subcapsular polar age-related cataract, bilateral: Secondary | ICD-10-CM | POA: Diagnosis not present

## 2017-05-29 DIAGNOSIS — H6504 Acute serous otitis media, recurrent, right ear: Secondary | ICD-10-CM | POA: Diagnosis not present

## 2017-05-29 DIAGNOSIS — H906 Mixed conductive and sensorineural hearing loss, bilateral: Secondary | ICD-10-CM | POA: Diagnosis not present

## 2017-05-29 DIAGNOSIS — H6062 Unspecified chronic otitis externa, left ear: Secondary | ICD-10-CM | POA: Diagnosis not present

## 2017-05-29 DIAGNOSIS — H6063 Unspecified chronic otitis externa, bilateral: Secondary | ICD-10-CM | POA: Diagnosis not present

## 2017-08-07 DIAGNOSIS — H73891 Other specified disorders of tympanic membrane, right ear: Secondary | ICD-10-CM | POA: Diagnosis not present

## 2017-08-07 DIAGNOSIS — H6063 Unspecified chronic otitis externa, bilateral: Secondary | ICD-10-CM | POA: Diagnosis not present

## 2017-08-07 DIAGNOSIS — H906 Mixed conductive and sensorineural hearing loss, bilateral: Secondary | ICD-10-CM | POA: Diagnosis not present

## 2017-08-26 DIAGNOSIS — H6063 Unspecified chronic otitis externa, bilateral: Secondary | ICD-10-CM | POA: Diagnosis not present

## 2017-08-26 DIAGNOSIS — H906 Mixed conductive and sensorineural hearing loss, bilateral: Secondary | ICD-10-CM | POA: Diagnosis not present

## 2017-08-26 DIAGNOSIS — H6523 Chronic serous otitis media, bilateral: Secondary | ICD-10-CM | POA: Diagnosis not present

## 2017-09-02 DIAGNOSIS — H73891 Other specified disorders of tympanic membrane, right ear: Secondary | ICD-10-CM | POA: Diagnosis not present

## 2017-09-02 DIAGNOSIS — H6523 Chronic serous otitis media, bilateral: Secondary | ICD-10-CM | POA: Diagnosis not present

## 2017-09-02 DIAGNOSIS — E871 Hypo-osmolality and hyponatremia: Secondary | ICD-10-CM | POA: Diagnosis not present

## 2017-09-02 DIAGNOSIS — H6063 Unspecified chronic otitis externa, bilateral: Secondary | ICD-10-CM | POA: Diagnosis not present

## 2017-09-02 DIAGNOSIS — H906 Mixed conductive and sensorineural hearing loss, bilateral: Secondary | ICD-10-CM | POA: Diagnosis not present

## 2017-09-02 DIAGNOSIS — H6533 Chronic mucoid otitis media, bilateral: Secondary | ICD-10-CM | POA: Diagnosis not present

## 2017-09-03 DIAGNOSIS — E871 Hypo-osmolality and hyponatremia: Secondary | ICD-10-CM | POA: Insufficient documentation

## 2017-09-09 DIAGNOSIS — E871 Hypo-osmolality and hyponatremia: Secondary | ICD-10-CM | POA: Diagnosis not present

## 2017-09-18 DIAGNOSIS — J309 Allergic rhinitis, unspecified: Secondary | ICD-10-CM | POA: Diagnosis not present

## 2017-09-18 DIAGNOSIS — R05 Cough: Secondary | ICD-10-CM | POA: Diagnosis not present

## 2017-09-18 DIAGNOSIS — Z6822 Body mass index (BMI) 22.0-22.9, adult: Secondary | ICD-10-CM | POA: Diagnosis not present

## 2017-09-18 DIAGNOSIS — I10 Essential (primary) hypertension: Secondary | ICD-10-CM | POA: Diagnosis not present

## 2017-09-30 ENCOUNTER — Other Ambulatory Visit: Payer: Self-pay | Admitting: *Deleted

## 2017-09-30 DIAGNOSIS — R911 Solitary pulmonary nodule: Secondary | ICD-10-CM

## 2017-10-02 DIAGNOSIS — H6063 Unspecified chronic otitis externa, bilateral: Secondary | ICD-10-CM | POA: Diagnosis not present

## 2017-10-02 DIAGNOSIS — H73891 Other specified disorders of tympanic membrane, right ear: Secondary | ICD-10-CM | POA: Diagnosis not present

## 2017-10-02 DIAGNOSIS — H906 Mixed conductive and sensorineural hearing loss, bilateral: Secondary | ICD-10-CM | POA: Diagnosis not present

## 2017-10-21 DIAGNOSIS — Z125 Encounter for screening for malignant neoplasm of prostate: Secondary | ICD-10-CM | POA: Diagnosis not present

## 2017-10-21 DIAGNOSIS — R82998 Other abnormal findings in urine: Secondary | ICD-10-CM | POA: Diagnosis not present

## 2017-10-21 DIAGNOSIS — E7849 Other hyperlipidemia: Secondary | ICD-10-CM | POA: Diagnosis not present

## 2017-10-21 DIAGNOSIS — I10 Essential (primary) hypertension: Secondary | ICD-10-CM | POA: Diagnosis not present

## 2017-10-28 DIAGNOSIS — E871 Hypo-osmolality and hyponatremia: Secondary | ICD-10-CM | POA: Diagnosis not present

## 2017-10-28 DIAGNOSIS — J3089 Other allergic rhinitis: Secondary | ICD-10-CM | POA: Diagnosis not present

## 2017-10-28 DIAGNOSIS — H7291 Unspecified perforation of tympanic membrane, right ear: Secondary | ICD-10-CM | POA: Diagnosis not present

## 2017-10-28 DIAGNOSIS — R808 Other proteinuria: Secondary | ICD-10-CM | POA: Diagnosis not present

## 2017-10-28 DIAGNOSIS — I7090 Unspecified atherosclerosis: Secondary | ICD-10-CM | POA: Diagnosis not present

## 2017-10-28 DIAGNOSIS — R3129 Other microscopic hematuria: Secondary | ICD-10-CM | POA: Diagnosis not present

## 2017-10-28 DIAGNOSIS — Z1389 Encounter for screening for other disorder: Secondary | ICD-10-CM | POA: Diagnosis not present

## 2017-10-28 DIAGNOSIS — Z0000 Encounter for general adult medical examination without abnormal findings: Secondary | ICD-10-CM | POA: Diagnosis not present

## 2017-10-28 DIAGNOSIS — K4090 Unilateral inguinal hernia, without obstruction or gangrene, not specified as recurrent: Secondary | ICD-10-CM | POA: Insufficient documentation

## 2017-10-28 DIAGNOSIS — Z6822 Body mass index (BMI) 22.0-22.9, adult: Secondary | ICD-10-CM | POA: Diagnosis not present

## 2017-10-28 DIAGNOSIS — H60501 Unspecified acute noninfective otitis externa, right ear: Secondary | ICD-10-CM | POA: Diagnosis not present

## 2017-10-28 DIAGNOSIS — R222 Localized swelling, mass and lump, trunk: Secondary | ICD-10-CM | POA: Diagnosis not present

## 2017-10-28 DIAGNOSIS — I2510 Atherosclerotic heart disease of native coronary artery without angina pectoris: Secondary | ICD-10-CM | POA: Insufficient documentation

## 2017-10-28 DIAGNOSIS — J449 Chronic obstructive pulmonary disease, unspecified: Secondary | ICD-10-CM | POA: Diagnosis not present

## 2017-11-05 ENCOUNTER — Other Ambulatory Visit: Payer: Medicare Other

## 2017-11-05 ENCOUNTER — Ambulatory Visit: Payer: Medicare Other | Admitting: Thoracic Surgery (Cardiothoracic Vascular Surgery)

## 2017-11-06 ENCOUNTER — Ambulatory Visit
Admission: RE | Admit: 2017-11-06 | Discharge: 2017-11-06 | Disposition: A | Discharge status: Home / Self Care | Payer: Medicare Other | Source: Ambulatory Visit | Attending: Thoracic Surgery (Cardiothoracic Vascular Surgery) | Admitting: Thoracic Surgery (Cardiothoracic Vascular Surgery)

## 2017-11-06 ENCOUNTER — Ambulatory Visit (INDEPENDENT_AMBULATORY_CARE_PROVIDER_SITE_OTHER): Payer: Medicare Other | Admitting: Thoracic Surgery (Cardiothoracic Vascular Surgery)

## 2017-11-06 ENCOUNTER — Other Ambulatory Visit: Payer: Self-pay

## 2017-11-06 ENCOUNTER — Encounter: Payer: Self-pay | Admitting: Thoracic Surgery (Cardiothoracic Vascular Surgery)

## 2017-11-06 VITALS — BP 122/76 | HR 88 | Resp 16 | Ht 72.0 in | Wt 155.0 lb

## 2017-11-06 DIAGNOSIS — D381 Neoplasm of uncertain behavior of trachea, bronchus and lung: Secondary | ICD-10-CM | POA: Diagnosis not present

## 2017-11-06 DIAGNOSIS — R911 Solitary pulmonary nodule: Secondary | ICD-10-CM

## 2017-11-06 NOTE — Progress Notes (Signed)
[address]       ,[place] [address]             [phone]     HPI: Mr. Strahm returns for scheduled follow-up visit  Olon Russ is a 67 year old smoker (50+ pack years) who also has a history of hypertension, arthritis, and polyps.  He had a lung nodule seen on a chest x-ray back in 2013.  He has been followed with CTs since then.  In December 2018 there was a new 6 mm nodule in the left upper lobe.  This was mildly hypermetabolic on PET/CT.  However on a follow-up CT in February the nodule was smaller in size.  He now returns for six-month follow-up visit.  He says that he is smoking half a pack a day currently.  He is not having any chest pain, pressure, tightness, shortness of breath, coughing or wheezing.  Past Medical History:  Diagnosis Date  . Allergy   . Arthritis    thumbs  . Arthritis   . Colonic adenoma 2009  . ED (erectile dysfunction)   . Emphysema lung (HCC) MILD   . History of colon polyps   . Hyperlipemia   . Hypertension   . Lumbago   . Tobacco abuse       Current Outpatient Medications  Medication Sig Dispense Refill  . Alfalfa 500 MG TABS Take by mouth. 4 tabs at lunch, 4 tabs dinner, 5 last evening    . b complex vitamins tablet Take 1 tablet by mouth daily.    . Chloramphenicol POWD by Does not apply route 2 (two) times a week. Wed and Saturday - apply two puffs in each ear twice a week    . cholecalciferol (VITAMIN D) 1000 units tablet Take 1,000 Units by mouth daily.    . Flaxseed, Linseed, (RA FLAX SEED OIL 1000 PO) Take 1 capsule by mouth daily.     . Lecithin 1200 MG CAPS Take 1,200 mg by mouth daily.    Marland Kitchen lisinopril (PRINIVIL,ZESTRIL) 5 MG tablet Take 5 mg by mouth daily.    . Multiple Vitamin (MULTIVITAMIN WITH MINERALS) TABS Take 1 tablet by mouth daily.    . Omega-3 Fatty Acids (FISH OIL) 1200 MG CAPS Take 1,200 mg by mouth daily.    Marland Kitchen OVER THE COUNTER MEDICATION Take 500 mg by mouth daily. 1 daily green tea    .  OVER THE COUNTER MEDICATION Tumeric 500 mg daily    . POTASSIUM GLUCONATE PO Take 99 mg by mouth daily.    . Probiotic Product (PROBIOTIC PO) Take 1 capsule by mouth daily. 20 billion with 4 strains    . PSYLLIUM HUSK PO Take by mouth. 4 tabs lunch, 4 dinner, 4 one hour before bed    . Selenium 200 MCG CAPS Take 200 mcg by mouth 2 (two) times a week.     . sodium chloride (OCEAN) 0.65 % SOLN nasal spray Place 1 spray into the nose daily.    . vitamin C (ASCORBIC ACID) 500 MG tablet Take 500 mg by mouth daily.    Marland Kitchen zinc gluconate 50 MG tablet Take 50 mg by mouth daily.     No current facility-administered medications for this visit.     Physical Exam BP 122/76 (BP Location: Left Arm, Patient Position: Sitting, Cuff Size: Large)   Pulse 88   Resp 16   Ht 6' (1.829 m)   Wt 155 lb (70.3 kg)  SpO2 98% Comment: ON RA  BMI 21.49 kg/m  67 year old man in no acute distress Alert and oriented x3 with no focal deficits No cervical or supraclavicular adenopathy Lungs clear with equal breath sounds bilaterally Cardiac regular rate and rhythm normal S1 and S2  Diagnostic Tests: CT CHEST WITHOUT CONTRAST  TECHNIQUE: Multidetector CT imaging of the chest was performed following the standard protocol without IV contrast.  COMPARISON:  Chest CTs ranging from 06/30/2014 through 05/07/2017. PET-CT 03/06/2017.  FINDINGS: Cardiovascular: Moderate coronary artery and mild aortic atherosclerosis. No acute vascular findings on noncontrast imaging. There is mitral annular calcification. The heart size is normal. There is no pericardial effusion.  Mediastinum/Nodes: There is a stable 10 mm right hilar node on image 78/2. No other enlarged mediastinal, hilar or axillary lymph nodes are seen. The thyroid gland, trachea and esophagus demonstrate no significant findings.  Lungs/Pleura: There is no pleural effusion. Moderate centrilobular and paraseptal emphysema with stable mild biapical  scarring. The subpleural nodule of concern posteriorly in the left upper lobe has completely resolved and not recurred. There are several small left-sided pulmonary nodules as seen on previous studies. However, 1 of these is new, measuring 5 mm on image 89/8. The lesion is well-circumscribed and not calcified. The lesion is indicated on coronal image 85/4 and sagittal image 134/6. No suspicious right lung findings.  Upper abdomen: The visualized upper abdomen appears stable without suspicious findings. There are calcified lymph nodes in the gastrohepatic ligament and porta hepatis.  Musculoskeletal/Chest wall: There is no chest wall mass or suspicious osseous finding.  IMPRESSION: 1. The left upper lobe nodule of concern has resolved. This was likely inflammatory. 2. Unfortunately, there is a new small (5 mm) left lower lobe nodule. This is well-circumscribed and not highly concerning morphologically, although will require continued follow-up. Chest CT follow-up at 6-12 months recommended. This recommendation follows the consensus statement: Guidelines for Management of Small Pulmonary Nodules Detected on CT Images: From the Fleischner Society 2017; Radiology 2017; 284:228-243. 3. Additional small left lung nodules are stable. 4. Coronary and Aortic Atherosclerosis (ICD10-I70.0) and Emphysema (ICD10-J43.9).   Electronically Signed   By: Richardean Sale M.D.   On: 11/06/2017 15:53   Impression: Mr. Deems is a 67 year old gentleman with history of tobacco abuse who has been followed for almost 6 years for a variety of lung nodules.  Some of these nodules have waxed and waned others have remained stable over time and still others have appeared in the resolved completely.  On today's scan the previous suspicious nodule in the left upper lobe is essentially completely resolved and however, there is a new 5 mm nodule in the left lower lobe.  He will need another scan in 6 months  to follow-up that nodule.  Tobacco abuse-strongly encouraged him to try to quit smoking altogether.  He really has never tried to quit altogether.  He has cut back from time to time.  I stressed that there is no safe level of cigarette smoking.   Melrose Nakayama, MD [facility] [phone]

## 2018-01-08 DIAGNOSIS — H906 Mixed conductive and sensorineural hearing loss, bilateral: Secondary | ICD-10-CM | POA: Diagnosis not present

## 2018-01-08 DIAGNOSIS — H60393 Other infective otitis externa, bilateral: Secondary | ICD-10-CM | POA: Diagnosis not present

## 2018-01-08 DIAGNOSIS — H66002 Acute suppurative otitis media without spontaneous rupture of ear drum, left ear: Secondary | ICD-10-CM | POA: Diagnosis not present

## 2018-01-08 DIAGNOSIS — J069 Acute upper respiratory infection, unspecified: Secondary | ICD-10-CM | POA: Diagnosis not present

## 2018-01-22 DIAGNOSIS — H60393 Other infective otitis externa, bilateral: Secondary | ICD-10-CM | POA: Diagnosis not present

## 2018-01-22 DIAGNOSIS — H906 Mixed conductive and sensorineural hearing loss, bilateral: Secondary | ICD-10-CM | POA: Diagnosis not present

## 2018-03-25 ENCOUNTER — Other Ambulatory Visit: Payer: Self-pay | Admitting: Thoracic Surgery (Cardiothoracic Vascular Surgery)

## 2018-03-25 DIAGNOSIS — R918 Other nonspecific abnormal finding of lung field: Secondary | ICD-10-CM

## 2018-03-27 DIAGNOSIS — H60393 Other infective otitis externa, bilateral: Secondary | ICD-10-CM | POA: Diagnosis not present

## 2018-03-27 DIAGNOSIS — H906 Mixed conductive and sensorineural hearing loss, bilateral: Secondary | ICD-10-CM | POA: Diagnosis not present

## 2018-03-27 DIAGNOSIS — H6063 Unspecified chronic otitis externa, bilateral: Secondary | ICD-10-CM | POA: Diagnosis not present

## 2018-03-27 DIAGNOSIS — H6041 Cholesteatoma of right external ear: Secondary | ICD-10-CM | POA: Diagnosis not present

## 2018-04-02 DIAGNOSIS — H6063 Unspecified chronic otitis externa, bilateral: Secondary | ICD-10-CM | POA: Diagnosis not present

## 2018-04-02 DIAGNOSIS — H60393 Other infective otitis externa, bilateral: Secondary | ICD-10-CM | POA: Diagnosis not present

## 2018-04-02 DIAGNOSIS — H906 Mixed conductive and sensorineural hearing loss, bilateral: Secondary | ICD-10-CM | POA: Diagnosis not present

## 2018-04-02 DIAGNOSIS — H6041 Cholesteatoma of right external ear: Secondary | ICD-10-CM | POA: Diagnosis not present

## 2018-05-07 ENCOUNTER — Ambulatory Visit
Admission: RE | Admit: 2018-05-07 | Discharge: 2018-05-07 | Disposition: A | Discharge status: Home / Self Care | Payer: Medicare Other | Source: Ambulatory Visit | Attending: Thoracic Surgery (Cardiothoracic Vascular Surgery) | Admitting: Thoracic Surgery (Cardiothoracic Vascular Surgery)

## 2018-05-07 DIAGNOSIS — R918 Other nonspecific abnormal finding of lung field: Secondary | ICD-10-CM

## 2018-05-07 DIAGNOSIS — J432 Centrilobular emphysema: Secondary | ICD-10-CM | POA: Diagnosis not present

## 2018-05-08 ENCOUNTER — Telehealth: Payer: Self-pay

## 2018-05-08 NOTE — Telephone Encounter (Signed)
-----   Message from Melrose Nakayama, MD sent at 05/07/2018  4:53 PM EST ----- I am seeing him next week [facility] ----- Message ----- From: Donnella Sham, RN Sent: 05/07/2018   3:55 PM EST To: Melrose Nakayama, MD  Patient is not seen in the office until 05/12/2018.  However, [facility] contacted the office to give a call report of Ct chest had changes, lung nodule increase in size.  Please review.  Thanks,  Caryl Pina

## 2018-05-09 ENCOUNTER — Other Ambulatory Visit: Payer: Medicare Other

## 2018-05-12 ENCOUNTER — Ambulatory Visit (INDEPENDENT_AMBULATORY_CARE_PROVIDER_SITE_OTHER): Payer: Medicare Other | Admitting: Thoracic Surgery (Cardiothoracic Vascular Surgery)

## 2018-05-12 ENCOUNTER — Other Ambulatory Visit: Payer: Self-pay

## 2018-05-12 ENCOUNTER — Encounter: Payer: Self-pay | Admitting: Thoracic Surgery (Cardiothoracic Vascular Surgery)

## 2018-05-12 VITALS — BP 125/79 | HR 84 | Resp 16 | Ht 72.0 in | Wt 155.0 lb

## 2018-05-12 DIAGNOSIS — Z720 Tobacco use: Secondary | ICD-10-CM

## 2018-05-12 DIAGNOSIS — K635 Polyp of colon: Secondary | ICD-10-CM | POA: Insufficient documentation

## 2018-05-12 DIAGNOSIS — I10 Essential (primary) hypertension: Secondary | ICD-10-CM

## 2018-05-12 DIAGNOSIS — R918 Other nonspecific abnormal finding of lung field: Secondary | ICD-10-CM | POA: Diagnosis not present

## 2018-05-12 DIAGNOSIS — E785 Hyperlipidemia, unspecified: Secondary | ICD-10-CM | POA: Insufficient documentation

## 2018-05-12 DIAGNOSIS — M1990 Unspecified osteoarthritis, unspecified site: Secondary | ICD-10-CM

## 2018-05-12 NOTE — Progress Notes (Signed)
[address]       [place] [address]             [phone]     HPI: Mr. Trettin returns for follow-up regarding his lung nodules  Fate Caster is a 68 year old gentleman with a history of hypertension, hyperlipidemia, arthritis, and a 50-pack-year history of tobacco abuse.  He first had a lung nodule seen on a chest x-ray back in 2013.  He is been followed with CT since then.  In December 2018 there was a new 6 mm nodule in the left upper lobe that was mildly hypermetabolic on PET/CT.  On follow-up that nodule got smaller.  He had a repeat CT 6 months ago.  The left upper lobe nodule had totally resolved but there was a new 5 mm nodule in the left lower lobe.  He now returns for follow-up of that.  In the interim since his last visit he has been feeling well.  He says he is currently smoking about 1/2 pack of cigarettes daily.  He has not been able to quit altogether.  He does not have any chest pain, pressure, tightness, or shortness of breath with normal activity.  He denies cough and wheezing.  Appetite is good.  Weight is stable.  Overall he feels well.  Past Medical History:  Diagnosis Date  . Allergy   . Arthritis    thumbs  . Arthritis   . Colonic adenoma 2009  . ED (erectile dysfunction)   . Emphysema lung (HCC) MILD   . History of colon polyps   . Hyperlipemia   . Hypertension   . Lumbago   . Tobacco abuse      Current Outpatient Medications  Medication Sig Dispense Refill  . Alfalfa 500 MG TABS Take by mouth. 4 tabs at lunch, 4 tabs dinner, 5 last evening    . b complex vitamins tablet Take 1 tablet by mouth daily.    . Chloramphenicol POWD by Does not apply route 2 (two) times a week. Wed and Saturday - apply two puffs in each ear twice a week    . cholecalciferol (VITAMIN D) 1000 units tablet Take 1,000 Units by mouth daily.    . Flaxseed, Linseed, (RA FLAX SEED OIL 1000 PO) Take 1 capsule by mouth daily.     . Lecithin 1200 MG CAPS Take  1,200 mg by mouth daily.    Marland Kitchen lisinopril (PRINIVIL,ZESTRIL) 5 MG tablet Take 5 mg by mouth daily.    . Multiple Vitamin (MULTIVITAMIN WITH MINERALS) TABS Take 1 tablet by mouth daily.    . Omega-3 Fatty Acids (FISH OIL) 1200 MG CAPS Take 1,200 mg by mouth daily.    Marland Kitchen OVER THE COUNTER MEDICATION Take 500 mg by mouth daily. 1 daily green tea    . OVER THE COUNTER MEDICATION Tumeric 500 mg daily    . POTASSIUM GLUCONATE PO Take 99 mg by mouth daily.    . Probiotic Product (PROBIOTIC PO) Take 1 capsule by mouth daily. 20 billion with 4 strains    . PSYLLIUM HUSK PO Take by mouth. 4 tabs lunch, 4 dinner, 4 one hour before bed    . Selenium 200 MCG CAPS Take 200 mcg by mouth 2 (two) times a week.     . sodium chloride (OCEAN) 0.65 % SOLN nasal spray Place 1 spray into the nose daily.    . vitamin C (ASCORBIC ACID) 500 MG tablet Take 500 mg by  mouth daily.    Marland Kitchen zinc gluconate 50 MG tablet Take 50 mg by mouth daily.     No current facility-administered medications for this visit.     Physical Exam BP 125/79 (BP Location: Right Arm, Patient Position: Sitting, Cuff Size: Large)   Pulse 84   Resp 16   Ht 6' (1.829 m)   Wt 155 lb (70.3 kg)   SpO2 98% Comment: ON RA  BMI 21.51 kg/m  68 year old man in no acute distress Alert and oriented x3 with no focal deficits No cervical or supraclavicular adenopathy Cardiac regular rate and rhythm normal S1 and S2 no rubs murmurs or gallops Lungs clear with equal breath sounds bilaterally No clubbing cyanosis or edema  Diagnostic Tests: CT CHEST WITHOUT CONTRAST  TECHNIQUE: Multidetector CT imaging of the chest was performed following the standard protocol without IV contrast.  COMPARISON:  11/06/2017  FINDINGS: Cardiovascular: The heart size is normal. No substantial pericardial effusion. Coronary artery calcification is evident. Calcification noted at the mitral annulus. Atherosclerotic calcification is noted in the wall of the thoracic  aorta.  Mediastinum/Nodes: No mediastinal lymphadenopathy. No evidence for gross hilar lymphadenopathy although assessment is limited by the lack of intravenous contrast on today's study. There is no axillary lymphadenopathy. The esophagus has normal imaging features.  Lungs/Pleura: The central tracheobronchial airways are patent. Centrilobular and paraseptal emphysema noted. Mild biapical pleuroparenchymal scarring is similar to prior. No suspicious nodule or mass in the right lung. No right pleural effusion.  The 5 mm left lower lobe pulmonary nodule identified as new on the prior study has progressed in the interval. The nodule has a lobular contour (best appreciated on coronal imaging 86/4) and measures 10 x 7 x 10 mm on today's exam.  4 mm left lower lobe nodule (89/8) is unchanged in the interval is also stable comparing back to 02/13/2017. 7 mm left lower lobe nodule (126/8) was present on the prior study and is stable in size. This nodule was present on 02/13/2017 and measured 6 mm at that time.  3 mm nodule posterior left costophrenic sulcus (138/8) is stable since the 02/13/2017 exam.  No left pleural effusion.  Upper Abdomen: Unremarkable.  Musculoskeletal: No worrisome lytic or sclerotic osseous abnormality.  IMPRESSION: 1. Interval progression of the 5 mm left lower lobe pulmonary nodule identified as new on the previous exam. This nodule now measures 10 x 7 x 10 mm and imaging features are highly suspicious for primary bronchogenic neoplasm. PET-CT may prove helpful to further evaluate. 2. Other scattered pulmonary nodules are stable in the interval. 3.  Emphysema. (ICD10-J43.9) 4.  Aortic Atherosclerois (ICD10-170.0)  These results will be called to the ordering clinician or representative by the Radiologist Assistant, and communication documented in the PACS or zVision Dashboard.   Electronically Signed   By: Misty Stanley M.D.   On:  05/07/2018 15:12 I personally reviewed the CT images and concur with the findings noted above  Impression: Mr. Olden is a 68 year old gentleman with a longstanding history of tobacco abuse who has had multiple lung nodules.  He was first found to have lung nodules back in 2013.  He has been followed since then.  Some of the nodules have waxed and waned over time.  On his previous CT scan 6 months ago there was a new left lower lobe nodule.  It was about 5 mm at that time.  On follow-up CT today that nodule has increased in size to 10 x 7 x 10  mm.  Given his history of tobacco abuse that nodule is very concerning for a primary bronchogenic carcinoma.  The nodule is potentially approachable with navigational bronchoscopy, but I would be hesitant to rely on a negative result due to the possibility of a false negative biopsy.  Unfortunately, it is not in a great location for a wedge resection.  I think a PET/CT will provide [place] with additional information that will help guide our initial diagnostic work-up.  We will go ahead and do pulmonary function testing with and without bronchodilators and preparation for possible surgical resection.  We will also arrange to have his CT scan converted to super D protocol for possible navigational bronchoscopy.  Tobacco abuse- Smoking cessation instruction/counseling given:  counseled patient on the dangers of tobacco use, advised patient to stop smoking, and reviewed strategies to maximize success  Plan: 1. Convert CT to super D protocol in case we decide to proceed with navigational bronchoscopy 2.  PET/CT-evaluate enlarging left lower lobe nodule, guide initial diagnostic work-up. 3.  Pulmonary function testing with and without bronchodilators 4.  Return in 2 weeks to discuss results  Melrose Nakayama, MD [facility] [phone]

## 2018-05-13 ENCOUNTER — Ambulatory Visit: Payer: Medicare Other | Admitting: Thoracic Surgery (Cardiothoracic Vascular Surgery)

## 2018-05-13 ENCOUNTER — Other Ambulatory Visit: Payer: Self-pay | Admitting: *Deleted

## 2018-05-13 DIAGNOSIS — R911 Solitary pulmonary nodule: Secondary | ICD-10-CM

## 2018-05-21 DIAGNOSIS — H524 Presbyopia: Secondary | ICD-10-CM | POA: Diagnosis not present

## 2018-05-21 DIAGNOSIS — H25013 Cortical age-related cataract, bilateral: Secondary | ICD-10-CM | POA: Diagnosis not present

## 2018-05-21 DIAGNOSIS — H2513 Age-related nuclear cataract, bilateral: Secondary | ICD-10-CM | POA: Diagnosis not present

## 2018-05-21 DIAGNOSIS — H43813 Vitreous degeneration, bilateral: Secondary | ICD-10-CM | POA: Diagnosis not present

## 2018-05-26 ENCOUNTER — Encounter: Payer: Medicare Other | Admitting: Thoracic Surgery (Cardiothoracic Vascular Surgery)

## 2018-05-26 ENCOUNTER — Ambulatory Visit (HOSPITAL_COMMUNITY)
Admission: RE | Admit: 2018-05-26 | Discharge: 2018-05-26 | Disposition: A | Discharge status: Home / Self Care | Payer: Medicare Other | Source: Ambulatory Visit | Attending: Thoracic Surgery (Cardiothoracic Vascular Surgery) | Admitting: Thoracic Surgery (Cardiothoracic Vascular Surgery)

## 2018-05-26 ENCOUNTER — Encounter (HOSPITAL_COMMUNITY): Payer: Medicare Other

## 2018-05-26 DIAGNOSIS — R911 Solitary pulmonary nodule: Secondary | ICD-10-CM

## 2018-05-26 LAB — PULMONARY FUNCTION TEST
DL/VA % pred: 64 %
DL/VA: 2.6 ml/min/mmHg/L
DLCO UNC % PRED: 56 %
DLCO unc: 15.63 ml/min/mmHg
FEF 25-75 PRE: 0.86 L/s
FEF 25-75 Post: 1.09 L/sec
FEF2575-%CHANGE-POST: 27 %
FEF2575-%PRED-POST: 40 %
FEF2575-%PRED-PRE: 31 %
FEV1-%Change-Post: 5 %
FEV1-%PRED-POST: 70 %
FEV1-%Pred-Pre: 66 %
FEV1-PRE: 2.35 L
FEV1-Post: 2.49 L
FEV1FVC-%CHANGE-POST: 1 %
FEV1FVC-%PRED-PRE: 78 %
FEV6-%Change-Post: 5 %
FEV6-%PRED-POST: 84 %
FEV6-%Pred-Pre: 79 %
FEV6-POST: 3.79 L
FEV6-Pre: 3.6 L
FEV6FVC-%Change-Post: 1 %
FEV6FVC-%Pred-Post: 94 %
FEV6FVC-%Pred-Pre: 93 %
FVC-%CHANGE-POST: 4 %
FVC-%PRED-PRE: 85 %
FVC-%Pred-Post: 88 %
FVC-POST: 4.23 L
FVC-Pre: 4.06 L
POST FEV1/FVC RATIO: 59 %
POST FEV6/FVC RATIO: 90 %
PRE FEV1/FVC RATIO: 58 %
Pre FEV6/FVC Ratio: 89 %
RV % pred: 130 %
RV: 3.26 L
TLC % pred: 100 %
TLC: 7.38 L

## 2018-05-26 LAB — GLUCOSE, CAPILLARY: Glucose-Capillary: 110 mg/dL — ABNORMAL HIGH (ref 70–99)

## 2018-05-26 MED ORDER — FLUDEOXYGLUCOSE F - 18 (FDG) INJECTION
7.7700 | Freq: Once | INTRAVENOUS | Status: AC | PRN
  Administered 2018-05-26: 7.77 via INTRAVENOUS

## 2018-05-26 MED ORDER — ALBUTEROL SULFATE (2.5 MG/3ML) 0.083% IN NEBU
2.5000 mg | INHALATION_SOLUTION | Freq: Once | RESPIRATORY_TRACT | Status: AC
  Administered 2018-05-26: 2.5 mg via RESPIRATORY_TRACT

## 2018-05-27 ENCOUNTER — Encounter: Payer: Medicare Other | Admitting: Thoracic Surgery (Cardiothoracic Vascular Surgery)

## 2018-05-28 ENCOUNTER — Other Ambulatory Visit: Payer: Self-pay

## 2018-05-28 ENCOUNTER — Encounter: Payer: Self-pay | Admitting: Thoracic Surgery (Cardiothoracic Vascular Surgery)

## 2018-05-28 ENCOUNTER — Ambulatory Visit (INDEPENDENT_AMBULATORY_CARE_PROVIDER_SITE_OTHER): Payer: Medicare Other | Admitting: Thoracic Surgery (Cardiothoracic Vascular Surgery)

## 2018-05-28 VITALS — BP 123/80 | HR 92 | Resp 16 | Ht 72.0 in | Wt 155.0 lb

## 2018-05-28 DIAGNOSIS — R918 Other nonspecific abnormal finding of lung field: Secondary | ICD-10-CM

## 2018-05-28 DIAGNOSIS — R310 Gross hematuria: Secondary | ICD-10-CM | POA: Diagnosis not present

## 2018-05-28 NOTE — H&P (View-Only) (Signed)
[address]       ,[place] [address]             [phone]      HPI: Mr. Tortorella returns to discuss results of his PET CT and PFTs  Jesson Foskey is a 68 year old gentleman with a history of tobacco abuse (50 pack years), hypertension, hyperlipidemia, and arthritis.  He has been followed for lung nodules dating back to a chest x-ray in 2013.  He has had some nodules that have waxed and waned over time.  In December 2018 there was a 6 mm nodule in the left upper lobe that was mildly hypermetabolic on PET CT, but that nodule got smaller on follow-up and ultimately resolved.  6 months ago his CT showed a new 5 mm left lower lobe lung nodule.  On a follow-up scan a couple of weeks ago he had increased in size to 10 x 7 x 10 mm.  I recommended a PET CT to further evaluate that nodule.  He feels well.  He has occasional cough but nothing out of the ordinary.  He has not had any fevers or chills or night sweats.  He is still smoking, he says he is down to a couple of cigarettes a day.  He denies chest pain, pressure, tightness or shortness of breath with exertion. Zubrod Score: At the time of surgery this patient's most appropriate activity status/level should be described as: [x]     0    Normal activity, no symptoms []     1    Restricted in physical strenuous activity but ambulatory, able to do out light work []     2    Ambulatory and capable of self care, unable to do work activities, up and about >50 % of waking hours                              []     3    Only limited self care, in bed greater than 50% of waking hours []     4    Completely disabled, no self care, confined to bed or chair []     5    Moribund  Past Medical History:  Diagnosis Date  . Allergy   . Arthritis    thumbs  . Arthritis   . Colonic adenoma 2009  . ED (erectile dysfunction)   . Emphysema lung (HCC) MILD   . History of colon polyps   . Hyperlipemia   . Hypertension   . Lumbago   .  Tobacco abuse     Current Outpatient Medications  Medication Sig Dispense Refill  . Alfalfa 500 MG TABS Take by mouth. 4 tabs at lunch, 4 tabs dinner, 5 last evening    . b complex vitamins tablet Take 1 tablet by mouth daily.    . Chloramphenicol POWD by Does not apply route 2 (two) times a week. Wed and Saturday - apply two puffs in each ear twice a week    . cholecalciferol (VITAMIN D) 1000 units tablet Take 1,000 Units by mouth daily.    . Flaxseed, Linseed, (RA FLAX SEED OIL 1000 PO) Take 1 capsule by mouth daily.     . Lecithin 1200 MG CAPS Take 1,200 mg by mouth daily.    Marland Kitchen lisinopril (PRINIVIL,ZESTRIL) 5 MG tablet Take 5 mg by mouth daily.    . Multiple Vitamin (MULTIVITAMIN WITH MINERALS) TABS  Take 1 tablet by mouth daily.    . Omega-3 Fatty Acids (FISH OIL) 1200 MG CAPS Take 1,200 mg by mouth daily.    Marland Kitchen OVER THE COUNTER MEDICATION Take 500 mg by mouth daily. 1 daily green tea    . OVER THE COUNTER MEDICATION Tumeric 500 mg daily    . POTASSIUM GLUCONATE PO Take 99 mg by mouth daily.    . Probiotic Product (PROBIOTIC PO) Take 1 capsule by mouth daily. 20 billion with 4 strains    . PSYLLIUM HUSK PO Take by mouth. 4 tabs lunch, 4 dinner, 4 one hour before bed    . Selenium 200 MCG CAPS Take 200 mcg by mouth 2 (two) times a week.     . sodium chloride (OCEAN) 0.65 % SOLN nasal spray Place 1 spray into the nose daily.    . vitamin C (ASCORBIC ACID) 500 MG tablet Take 500 mg by mouth daily.    Marland Kitchen zinc gluconate 50 MG tablet Take 50 mg by mouth daily.     No current facility-administered medications for this visit.     Physical Exam BP 123/80 (BP Location: Right Arm, Patient Position: Sitting, Cuff Size: Large)   Pulse 92   Resp 16   Ht 6' (1.829 m)   Wt 155 lb (70.3 kg)   SpO2 97% Comment: RA  BMI 21.53 kg/m  68 year old man in no acute distress Well-developed and well-nourished HEENT unremarkable Neck no thyromegaly or adenopathy Cardiac regular rate and rhythm normal S1  and S2 Lungs clear with equal breath sounds bilaterally No clubbing cyanosis or edema  Diagnostic Tests: NUCLEAR MEDICINE PET SKULL BASE TO THIGH  TECHNIQUE: 7.8 mCi F-18 FDG was injected intravenously. Full-ring PET imaging was performed from the skull base to thigh after the radiotracer. CT data was obtained and used for attenuation correction and anatomic localization.  Fasting blood glucose: 110 mg/dl  COMPARISON:  CT 03/06/2017, 05/17/2018, PET-CT 03/06/2017  FINDINGS: Mediastinal blood pool activity: SUV max 2.4  NECK: No hypermetabolic lymph nodes in the neck.  Incidental CT findings: none  CHEST: LEFT lobe nodule identified on CT of 11/06/2017 has enlarged in the interval measuring 11 mm (image 39/8) compared to 5 mm at that time. This enlarging LEFT lobe nodule has associated mild metabolic activity SUV max equal 1.5.  No additional suspicious nodules are present within the LEFT lung. The RIGHT lung is clear of nodules. Bilateral upper lobe centrilobular emphysema.  No hypermetabolic mediastinal lymph nodes.  Incidental CT findings: none  ABDOMEN/PELVIS: No abnormal hypermetabolic activity within the liver, pancreas, adrenal glands, or spleen. No hypermetabolic lymph nodes in the abdomen or pelvis.  Incidental CT findings: none  SKELETON: No focal hypermetabolic activity to suggest skeletal metastasis.  Incidental CT findings: none  IMPRESSION: 1. Enlarging mildly hypermetabolic LEFT lower lobe nodule is concerning for bronchogenic carcinoma. Recommend tissue sampling or resection. 2. No evidence of metastatic mediastinal adenopathy. 3. Emphysematous change the upper lobes.   Electronically Signed   By: Suzy Bouchard M.D.   On: 05/26/2018 15:58 I personally reviewed the PET/CT images and concur with the findings noted above  Impression: Mr. Arana is a 68 year old man with a 50-pack-year history of tobacco abuse who was first  found to have a lung nodule back in 2013.  He has had several nodules that have waxed and waned over time.  About 6 months ago he was found to have a new 5 mm nodule in the left lower lobe.  On  a follow-up CT a couple of weeks ago that nodule had increased to about 10 mm in diameter.  There was no adenopathy and no other suspicious nodules.  On PET CT the nodule has mild metabolic activity with an SUV of 1.5.  The nodule may be slightly larger as well although it is difficult to tell for sure due to the different quality of the exams.  Interestingly, when looking at the nodule on soft tissue windows there seems to be less soft tissue component on the PET/CT than there was on the CT from February.  The possible increase in size and activity on PET would argue in favor this potentially being a malignancy.  If the soft tissue component is in fact smaller, that would argue against it being malignant.  Unfortunately the quality of the CT component of the PET/CT precludes me from saying it definitely has less of a solid component.  Given the uncertainty I think the best option for her neck step would be to proceed with a navigational bronchoscopy for biopsy and fiducial placement.  The general nature of the procedure was discussed with Mr. Hoard.  He understands it would be done in the operating room under general anesthesia.  It is an endoscopic procedure that we would plan to do on an outpatient basis.  I informed him of the indications, risks, benefits, alternatives, and limitations of the procedure.  He understands there is no guarantee of a definitive diagnosis.  He understands the risks include those associated with general anesthesia.  He understands risks include, but are not limited to death, MI, stroke, DVT, PE, bleeding, pneumothorax, as well as failure to make a definitive diagnosis.  He understands and accepts the risks and agrees to proceed.  He does have adequate pulmonary function to tolerate a  resection should 1 be necessary.  We will plan to place fiducials in case he were to opt for stereotactic radiation.  Tobacco abuse-emphasized the importance of cessation. Plan: Electromagnetic navigational bronchoscopy and fiducial placement on Wednesday, 06/11/2018  Melrose Nakayama, MD [facility] [phone]

## 2018-05-28 NOTE — Progress Notes (Signed)
[address]       [place] [address]             [phone]      HPI: Dylan Harvey returns to discuss results of his PET CT and PFTs  Dylan Harvey is a 68 year old gentleman with a history of tobacco abuse (50 pack years), hypertension, hyperlipidemia, and arthritis.  He has been followed for lung nodules dating back to a chest x-ray in 2013.  He has had some nodules that have waxed and waned over time.  In December 2018 there was a 6 mm nodule in the left upper lobe that was mildly hypermetabolic on PET CT, but that nodule got smaller on follow-up and ultimately resolved.  6 months ago his CT showed a new 5 mm left lower lobe lung nodule.  On a follow-up scan a couple of weeks ago he had increased in size to 10 x 7 x 10 mm.  I recommended a PET CT to further evaluate that nodule.  He feels well.  He has occasional cough but nothing out of the ordinary.  He has not had any fevers or chills or night sweats.  He is still smoking, he says he is down to a couple of cigarettes a day.  He denies chest pain, pressure, tightness or shortness of breath with exertion. Zubrod Score: At the time of surgery this patient's most appropriate activity status/level should be described as: [x]     0    Normal activity, no symptoms []     1    Restricted in physical strenuous activity but ambulatory, able to do out light work []     2    Ambulatory and capable of self care, unable to do work activities, up and about >50 % of waking hours                              []     3    Only limited self care, in bed greater than 50% of waking hours []     4    Completely disabled, no self care, confined to bed or chair []     5    Moribund  Past Medical History:  Diagnosis Date  . Allergy   . Arthritis    thumbs  . Arthritis   . Colonic adenoma 2009  . ED (erectile dysfunction)   . Emphysema lung (HCC) MILD   . History of colon polyps   . Hyperlipemia   . Hypertension   . Lumbago   .  Tobacco abuse     Current Outpatient Medications  Medication Sig Dispense Refill  . Alfalfa 500 MG TABS Take by mouth. 4 tabs at lunch, 4 tabs dinner, 5 last evening    . b complex vitamins tablet Take 1 tablet by mouth daily.    . Chloramphenicol POWD by Does not apply route 2 (two) times a week. Wed and Saturday - apply two puffs in each ear twice a week    . cholecalciferol (VITAMIN D) 1000 units tablet Take 1,000 Units by mouth daily.    . Flaxseed, Linseed, (RA FLAX SEED OIL 1000 PO) Take 1 capsule by mouth daily.     . Lecithin 1200 MG CAPS Take 1,200 mg by mouth daily.    Marland Kitchen lisinopril (PRINIVIL,ZESTRIL) 5 MG tablet Take 5 mg by mouth daily.    . Multiple Vitamin (MULTIVITAMIN WITH MINERALS) TABS  Take 1 tablet by mouth daily.    . Omega-3 Fatty Acids (FISH OIL) 1200 MG CAPS Take 1,200 mg by mouth daily.    Marland Kitchen OVER THE COUNTER MEDICATION Take 500 mg by mouth daily. 1 daily green tea    . OVER THE COUNTER MEDICATION Tumeric 500 mg daily    . POTASSIUM GLUCONATE PO Take 99 mg by mouth daily.    . Probiotic Product (PROBIOTIC PO) Take 1 capsule by mouth daily. 20 billion with 4 strains    . PSYLLIUM HUSK PO Take by mouth. 4 tabs lunch, 4 dinner, 4 one hour before bed    . Selenium 200 MCG CAPS Take 200 mcg by mouth 2 (two) times a week.     . sodium chloride (OCEAN) 0.65 % SOLN nasal spray Place 1 spray into the nose daily.    . vitamin C (ASCORBIC ACID) 500 MG tablet Take 500 mg by mouth daily.    Marland Kitchen zinc gluconate 50 MG tablet Take 50 mg by mouth daily.     No current facility-administered medications for this visit.     Physical Exam BP 123/80 (BP Location: Right Arm, Patient Position: Sitting, Cuff Size: Large)   Pulse 92   Resp 16   Ht 6' (1.829 m)   Wt 155 lb (70.3 kg)   SpO2 97% Comment: RA  BMI 21.2 kg/m  68 year old man in no acute distress Well-developed and well-nourished HEENT unremarkable Neck no thyromegaly or adenopathy Cardiac regular rate and rhythm normal S1  and S2 Lungs clear with equal breath sounds bilaterally No clubbing cyanosis or edema  Diagnostic Tests: NUCLEAR MEDICINE PET SKULL BASE TO THIGH  TECHNIQUE: 7.8 mCi F-18 FDG was injected intravenously. Full-ring PET imaging was performed from the skull base to thigh after the radiotracer. CT data was obtained and used for attenuation correction and anatomic localization.  Fasting blood glucose: 110 mg/dl  COMPARISON:  CT 03/06/2017, 05/17/2018, PET-CT 03/06/2017  FINDINGS: Mediastinal blood pool activity: SUV max 2.4  NECK: No hypermetabolic lymph nodes in the neck.  Incidental CT findings: none  CHEST: LEFT lobe nodule identified on CT of 11/06/2017 has enlarged in the interval measuring 11 mm (image 39/8) compared to 5 mm at that time. This enlarging LEFT lobe nodule has associated mild metabolic activity SUV max equal 1.5.  No additional suspicious nodules are present within the LEFT lung. The RIGHT lung is clear of nodules. Bilateral upper lobe centrilobular emphysema.  No hypermetabolic mediastinal lymph nodes.  Incidental CT findings: none  ABDOMEN/PELVIS: No abnormal hypermetabolic activity within the liver, pancreas, adrenal glands, or spleen. No hypermetabolic lymph nodes in the abdomen or pelvis.  Incidental CT findings: none  SKELETON: No focal hypermetabolic activity to suggest skeletal metastasis.  Incidental CT findings: none  IMPRESSION: 1. Enlarging mildly hypermetabolic LEFT lower lobe nodule is concerning for bronchogenic carcinoma. Recommend tissue sampling or resection. 2. No evidence of metastatic mediastinal adenopathy. 3. Emphysematous change the upper lobes.   Electronically Signed   By: Suzy Bouchard M.D.   On: 05/26/2018 15:58 I personally reviewed the PET/CT images and concur with the findings noted above  Impression: Dylan Harvey is a 68 year old man with a 50-pack-year history of tobacco abuse who was first  found to have a lung nodule back in 2013.  He has had several nodules that have waxed and waned over time.  About 6 months ago he was found to have a new 5 mm nodule in the left lower lobe.  On  a follow-up CT a couple of weeks ago that nodule had increased to about 10 mm in diameter.  There was no adenopathy and no other suspicious nodules.  On PET CT the nodule has mild metabolic activity with an SUV of 1.5.  The nodule may be slightly larger as well although it is difficult to tell for sure due to the different quality of the exams.  Interestingly, when looking at the nodule on soft tissue windows there seems to be less soft tissue component on the PET/CT than there was on the CT from February.  The possible increase in size and activity on PET would argue in favor this potentially being a malignancy.  If the soft tissue component is in fact smaller, that would argue against it being malignant.  Unfortunately the quality of the CT component of the PET/CT precludes me from saying it definitely has less of a solid component.  Given the uncertainty I think the best option for her neck step would be to proceed with a navigational bronchoscopy for biopsy and fiducial placement.  The general nature of the procedure was discussed with Dylan Harvey.  He understands it would be done in the operating room under general anesthesia.  It is an endoscopic procedure that we would plan to do on an outpatient basis.  I informed him of the indications, risks, benefits, alternatives, and limitations of the procedure.  He understands there is no guarantee of a definitive diagnosis.  He understands the risks include those associated with general anesthesia.  He understands risks include, but are not limited to death, MI, stroke, DVT, PE, bleeding, pneumothorax, as well as failure to make a definitive diagnosis.  He understands and accepts the risks and agrees to proceed.  He does have adequate pulmonary function to tolerate a  resection should 1 be necessary.  We will plan to place fiducials in case he were to opt for stereotactic radiation.  Tobacco abuse-emphasized the importance of cessation. Plan: Electromagnetic navigational bronchoscopy and fiducial placement on Wednesday, 06/11/2018  Melrose Nakayama, MD [facility] [phone]

## 2018-05-29 ENCOUNTER — Other Ambulatory Visit: Payer: Self-pay | Admitting: *Deleted

## 2018-05-29 DIAGNOSIS — R911 Solitary pulmonary nodule: Secondary | ICD-10-CM

## 2018-06-04 ENCOUNTER — Encounter (HOSPITAL_COMMUNITY)
Admission: RE | Admit: 2018-06-04 | Discharge: 2018-06-04 | Disposition: A | Discharge status: Home / Self Care | Payer: Medicare Other | Source: Ambulatory Visit | Attending: Thoracic Surgery (Cardiothoracic Vascular Surgery) | Admitting: Thoracic Surgery (Cardiothoracic Vascular Surgery)

## 2018-06-04 ENCOUNTER — Other Ambulatory Visit: Payer: Self-pay

## 2018-06-04 ENCOUNTER — Encounter (HOSPITAL_COMMUNITY): Payer: Self-pay

## 2018-06-04 DIAGNOSIS — H906 Mixed conductive and sensorineural hearing loss, bilateral: Secondary | ICD-10-CM | POA: Diagnosis not present

## 2018-06-04 DIAGNOSIS — Z01818 Encounter for other preprocedural examination: Secondary | ICD-10-CM | POA: Diagnosis not present

## 2018-06-04 DIAGNOSIS — H6063 Unspecified chronic otitis externa, bilateral: Secondary | ICD-10-CM | POA: Diagnosis not present

## 2018-06-04 DIAGNOSIS — H60393 Other infective otitis externa, bilateral: Secondary | ICD-10-CM | POA: Diagnosis not present

## 2018-06-04 DIAGNOSIS — I10 Essential (primary) hypertension: Secondary | ICD-10-CM | POA: Diagnosis not present

## 2018-06-04 DIAGNOSIS — I4510 Unspecified right bundle-branch block: Secondary | ICD-10-CM | POA: Insufficient documentation

## 2018-06-04 DIAGNOSIS — R9431 Abnormal electrocardiogram [ECG] [EKG]: Secondary | ICD-10-CM | POA: Insufficient documentation

## 2018-06-04 DIAGNOSIS — R911 Solitary pulmonary nodule: Secondary | ICD-10-CM | POA: Diagnosis not present

## 2018-06-04 DIAGNOSIS — H6041 Cholesteatoma of right external ear: Secondary | ICD-10-CM | POA: Diagnosis not present

## 2018-06-04 LAB — COMPREHENSIVE METABOLIC PANEL
ALT: 18 U/L (ref 0–44)
AST: 20 U/L (ref 15–41)
Albumin: 4.2 g/dL (ref 3.5–5.0)
Alkaline Phosphatase: 73 U/L (ref 38–126)
Anion gap: 8 (ref 5–15)
BUN: 11 mg/dL (ref 8–23)
CO2: 28 mmol/L (ref 22–32)
CREATININE: 0.94 mg/dL (ref 0.61–1.24)
Calcium: 9.5 mg/dL (ref 8.9–10.3)
Chloride: 98 mmol/L (ref 98–111)
GFR calc Af Amer: >60 mL/min (ref >60)
GFR calc non Af Amer: >60 mL/min (ref >60)
GLUCOSE: 149 mg/dL — AB (ref 70–99)
Potassium: 3.9 mmol/L (ref 3.5–5.1)
Sodium: 134 mmol/L — ABNORMAL LOW (ref 135–145)
Total Bilirubin: 0.7 mg/dL (ref 0.3–1.2)
Total Protein: 7 g/dL (ref 6.5–8.1)

## 2018-06-04 LAB — PROTIME-INR
INR: 1 (ref 0.8–1.2)
Prothrombin Time: 13.2 seconds (ref 11.4–15.2)

## 2018-06-04 LAB — APTT: APTT: 28 s (ref 24–36)

## 2018-06-04 LAB — CBC
HCT: 39.4 % (ref 39.0–52.0)
Hemoglobin: 13.6 g/dL (ref 13.0–17.0)
MCH: 32.9 pg (ref 26.0–34.0)
MCHC: 34.5 g/dL (ref 30.0–36.0)
MCV: 95.2 fL (ref 80.0–100.0)
Platelets: 190 10*3/uL (ref 150–400)
RBC: 4.14 MIL/uL — ABNORMAL LOW (ref 4.22–5.81)
RDW: 11.9 % (ref 11.5–15.5)
WBC: 8.1 10*3/uL (ref 4.0–10.5)
nRBC: 0 % (ref 0.0–0.2)

## 2018-06-04 NOTE — Progress Notes (Signed)

## 2018-06-04 NOTE — Progress Notes (Signed)
PCP - Crist Infante Cardiologist - denies  Chest x-ray - will need DOS EKG - 06/04/18 Stress Test - denies ECHO - denies Cardiac Cath - denies  Anesthesia review: NO  Patient refuses to take off his wedding band before surgery.  I explained that the anesthesiologist will make that determination on the day of surgery.   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 Patient was also instructed that they will need to review over the PAT instructions again at home before surgery.

## 2018-06-04 NOTE — Pre-Procedure Instructions (Signed)
Dylan Harvey  06/04/2018      [id number] - [place], [place] [address] [place] [address] Phone: [phone] Fax: [phone]    Your procedure is scheduled on March 25  Report to [place] at 0930 A.M.  Call this number if you have problems the morning of surgery:  [phone]   Remember:  Do not eat or drink after midnight.   There are No medications that you need to take the morning of surgery  7 days prior to surgery STOP taking any Aspirin (unless otherwise instructed by your surgeon), Aleve, Naproxen, Ibuprofen, Motrin, Advil, Goody's, BC's, all herbal medications, fish oil, and all vitamins.     Do not wear jewelry  Do not wear lotions, powders, or cologne, or deodorant.  Men may shave face and neck.  Do not bring valuables to the hospital.  [facility] is not responsible for any belongings or valuables.  Contacts, dentures or bridgework may not be worn into surgery.  Leave your suitcase in the car.  After surgery it may be brought to your room.  For patients admitted to the hospital, discharge time will be determined by your treatment team.  Patients discharged the day of surgery will not be allowed to drive home.    Special instructions:   - Preparing For Surgery  Before surgery, you can play an important role. Because skin is not sterile, your skin needs to be as free of germs as possible. You can reduce the number of germs on your skin by washing with CHG (chlorahexidine gluconate) Soap before surgery.  CHG is an antiseptic cleaner which kills germs and bonds with the skin to continue killing germs even after washing.    Oral Hygiene is also important to reduce your risk of infection.  Remember - BRUSH YOUR TEETH THE MORNING OF SURGERY WITH YOUR REGULAR TOOTHPASTE  Please do not use if you have an allergy to CHG or antibacterial soaps. If  your skin becomes reddened/irritated stop using the CHG.  Do not shave (including legs and underarms) for at least 48 hours prior to first CHG shower. It is OK to shave your face.  Please follow these instructions carefully.   1. Shower the NIGHT BEFORE SURGERY and the MORNING OF SURGERY with CHG.   2. If you chose to wash your hair, wash your hair first as usual with your normal shampoo.  3. After you shampoo, rinse your hair and body thoroughly to remove the shampoo.  4. Use CHG as you would any other liquid soap. You can apply CHG directly to the skin and wash gently with a scrungie or a clean washcloth.   5. Apply the CHG Soap to your body ONLY FROM THE NECK DOWN.  Do not use on open wounds or open sores. Avoid contact with your eyes, ears, mouth and genitals (private parts). Wash Face and genitals (private parts)  with your normal soap.  6. Wash thoroughly, paying special attention to the area where your surgery will be performed.  7. Thoroughly rinse your body with warm water from the neck down.  8. DO NOT shower/wash with your normal soap after using and rinsing off the CHG Soap.  9. Pat yourself dry with a CLEAN TOWEL.  10. Wear CLEAN PAJAMAS to bed the night before surgery, wear comfortable clothes the morning of surgery  11. Place CLEAN SHEETS on your  bed the night of your first shower and DO NOT SLEEP WITH PETS.    Day of Surgery:  Do not apply any deodorants/lotions.  Please wear clean clothes to the hospital/surgery center.   Remember to brush your teeth WITH YOUR REGULAR TOOTHPASTE.    Please read over the following fact sheets that you were given.

## 2018-06-05 NOTE — Anesthesia Preprocedure Evaluation (Addendum)
Anesthesia Evaluation  Patient identified by MRN, date of birth, ID band Patient awake    Reviewed: Allergy & Precautions, NPO status , Patient's Chart, lab work & pertinent test results  Airway Mallampati: II  TM Distance: >3 FB Neck ROM: Full    Dental no notable dental hx.    Pulmonary COPD, Current Smoker,    Pulmonary exam normal breath sounds clear to auscultation       Cardiovascular hypertension, Pt. on medications Normal cardiovascular exam Rhythm:Regular Rate:Normal  ECG: rate 97. Normal sinus rhythm Right bundle branch block   Neuro/Psych negative neurological ROS  negative psych ROS   GI/Hepatic negative GI ROS, Neg liver ROS,   Endo/Other  negative endocrine ROS  Renal/GU negative Renal ROS     Musculoskeletal negative musculoskeletal ROS (+)   Abdominal   Peds  Hematology HLD   Anesthesia Other Findings LUNG NODULE  Reproductive/Obstetrics                           Anesthesia Physical Anesthesia Plan  ASA: III  Anesthesia Plan: General   Post-op Pain Management:    Induction: Intravenous  PONV Risk Score and Plan: 1 and Ondansetron, Dexamethasone, Midazolam and Treatment may vary due to age or medical condition  Airway Management Planned: Oral ETT  Additional Equipment:   Intra-op Plan:   Post-operative Plan: Extubation in OR  Informed Consent: I have reviewed the patients History and Physical, chart, labs and discussed the procedure including the risks, benefits and alternatives for the proposed anesthesia with the patient or authorized representative who has indicated his/her understanding and acceptance.     Dental advisory given  Plan Discussed with: CRNA  Anesthesia Plan Comments: (Per PA: New RBBB on PAT EKG. No CAD history. Per Dr. Leonarda Salon note his Zubrod score is 0. He denied any CV symptoms at PAT appt.  Patient unwilling to remove ring from  left hand. Increased risk of injury discussed.)      Anesthesia Quick Evaluation

## 2018-06-10 NOTE — Progress Notes (Signed)
Communicated new visitor restrictions to patient.  Patient was not happy regarding this but verbalized understanding.  He did inquire about rescheduling his procedure.  I told him he would need to get in touch with Dr. Leonarda Salon office to reschedule.

## 2018-06-11 ENCOUNTER — Encounter (HOSPITAL_COMMUNITY)
Admission: RE | Disposition: A | Discharge status: Home / Self Care | Payer: Self-pay | Source: Home / Self Care | Attending: Thoracic Surgery (Cardiothoracic Vascular Surgery)

## 2018-06-11 ENCOUNTER — Encounter: Payer: Self-pay | Admitting: Thoracic Surgery (Cardiothoracic Vascular Surgery)

## 2018-06-11 ENCOUNTER — Ambulatory Visit (HOSPITAL_COMMUNITY): Payer: Medicare Other

## 2018-06-11 ENCOUNTER — Other Ambulatory Visit: Payer: Self-pay

## 2018-06-11 ENCOUNTER — Ambulatory Visit (HOSPITAL_COMMUNITY): Payer: Medicare Other | Admitting: Physician Assistant

## 2018-06-11 ENCOUNTER — Ambulatory Visit (HOSPITAL_COMMUNITY): Payer: Medicare Other | Admitting: Certified Registered"

## 2018-06-11 ENCOUNTER — Encounter (HOSPITAL_COMMUNITY): Payer: Self-pay

## 2018-06-11 ENCOUNTER — Telehealth: Payer: Self-pay

## 2018-06-11 ENCOUNTER — Ambulatory Visit (HOSPITAL_COMMUNITY)
Admission: RE | Admit: 2018-06-11 | Discharge: 2018-06-11 | Disposition: A | Discharge status: Home / Self Care | Payer: Medicare Other | Attending: Thoracic Surgery (Cardiothoracic Vascular Surgery) | Admitting: Thoracic Surgery (Cardiothoracic Vascular Surgery)

## 2018-06-11 DIAGNOSIS — Z419 Encounter for procedure for purposes other than remedying health state, unspecified: Secondary | ICD-10-CM

## 2018-06-11 DIAGNOSIS — J439 Emphysema, unspecified: Secondary | ICD-10-CM | POA: Diagnosis not present

## 2018-06-11 DIAGNOSIS — R911 Solitary pulmonary nodule: Secondary | ICD-10-CM | POA: Diagnosis not present

## 2018-06-11 DIAGNOSIS — F17210 Nicotine dependence, cigarettes, uncomplicated: Secondary | ICD-10-CM | POA: Diagnosis not present

## 2018-06-11 DIAGNOSIS — I10 Essential (primary) hypertension: Secondary | ICD-10-CM | POA: Diagnosis not present

## 2018-06-11 DIAGNOSIS — Z79899 Other long term (current) drug therapy: Secondary | ICD-10-CM | POA: Diagnosis not present

## 2018-06-11 DIAGNOSIS — C3432 Malignant neoplasm of lower lobe, left bronchus or lung: Secondary | ICD-10-CM | POA: Insufficient documentation

## 2018-06-11 DIAGNOSIS — R846 Abnormal cytological findings in specimens from respiratory organs and thorax: Secondary | ICD-10-CM | POA: Diagnosis not present

## 2018-06-11 DIAGNOSIS — C3492 Malignant neoplasm of unspecified part of left bronchus or lung: Secondary | ICD-10-CM | POA: Diagnosis not present

## 2018-06-11 HISTORY — PX: VIDEO BRONCHOSCOPY WITH ENDOBRONCHIAL NAVIGATION: SHX6175

## 2018-06-11 HISTORY — PX: FUDUCIAL PLACEMENT: SHX5083

## 2018-06-11 SURGERY — VIDEO BRONCHOSCOPY WITH ENDOBRONCHIAL NAVIGATION
Anesthesia: General

## 2018-06-11 MED ORDER — PROPOFOL 500 MG/50ML IV EMUL
INTRAVENOUS | Status: DC | PRN
  Administered 2018-06-11: 50 ug/kg/min via INTRAVENOUS

## 2018-06-11 MED ORDER — 0.9 % SODIUM CHLORIDE (POUR BTL) OPTIME
TOPICAL | Status: DC | PRN
  Administered 2018-06-11: 1000 mL

## 2018-06-11 MED ORDER — DEXAMETHASONE SODIUM PHOSPHATE 10 MG/ML IJ SOLN
INTRAMUSCULAR | Status: DC | PRN
  Administered 2018-06-11: 10 mg via INTRAVENOUS

## 2018-06-11 MED ORDER — DEXAMETHASONE SODIUM PHOSPHATE 10 MG/ML IJ SOLN
INTRAMUSCULAR | Status: AC
  Filled 2018-06-11: qty 2

## 2018-06-11 MED ORDER — LACTATED RINGERS IV SOLN
INTRAVENOUS | Status: DC
  Administered 2018-06-11: 10:00:00 via INTRAVENOUS

## 2018-06-11 MED ORDER — LIDOCAINE 2% (20 MG/ML) 5 ML SYRINGE
INTRAMUSCULAR | Status: DC | PRN
  Administered 2018-06-11: 60 mg via INTRAVENOUS

## 2018-06-11 MED ORDER — ACETAMINOPHEN 500 MG PO TABS
1000.0000 mg | ORAL_TABLET | Freq: Once | ORAL | Status: AC
  Administered 2018-06-11: 1000 mg via ORAL
  Filled 2018-06-11: qty 2

## 2018-06-11 MED ORDER — FENTANYL CITRATE (PF) 250 MCG/5ML IJ SOLN
INTRAMUSCULAR | Status: AC
  Filled 2018-06-11: qty 5

## 2018-06-11 MED ORDER — SODIUM CHLORIDE 0.9 % IV SOLN
INTRAVENOUS | Status: DC | PRN
  Administered 2018-06-11: 25 ug/min via INTRAVENOUS

## 2018-06-11 MED ORDER — ROCURONIUM BROMIDE 50 MG/5ML IV SOSY
PREFILLED_SYRINGE | INTRAVENOUS | Status: AC
  Filled 2018-06-11: qty 5

## 2018-06-11 MED ORDER — ROCURONIUM BROMIDE 50 MG/5ML IV SOSY
PREFILLED_SYRINGE | INTRAVENOUS | Status: AC
  Filled 2018-06-11: qty 10

## 2018-06-11 MED ORDER — PROPOFOL 10 MG/ML IV BOLUS
INTRAVENOUS | Status: AC
  Filled 2018-06-11: qty 20

## 2018-06-11 MED ORDER — MIDAZOLAM HCL 2 MG/2ML IJ SOLN
INTRAMUSCULAR | Status: AC
  Filled 2018-06-11: qty 2

## 2018-06-11 MED ORDER — ONDANSETRON HCL 4 MG/2ML IJ SOLN: 4.0000 mg | Freq: Once | INTRAMUSCULAR | Status: DC | PRN

## 2018-06-11 MED ORDER — KETOROLAC TROMETHAMINE 15 MG/ML IJ SOLN: 15.0000 mg | Freq: Once | INTRAMUSCULAR | Status: DC | PRN

## 2018-06-11 MED ORDER — LIDOCAINE 2% (20 MG/ML) 5 ML SYRINGE
INTRAMUSCULAR | Status: AC
  Filled 2018-06-11: qty 10

## 2018-06-11 MED ORDER — SUGAMMADEX SODIUM 200 MG/2ML IV SOLN
INTRAVENOUS | Status: DC | PRN
  Administered 2018-06-11: 290 mg via INTRAVENOUS

## 2018-06-11 MED ORDER — MIDAZOLAM HCL 5 MG/5ML IJ SOLN
INTRAMUSCULAR | Status: DC | PRN
  Administered 2018-06-11: 2 mg via INTRAVENOUS

## 2018-06-11 MED ORDER — EPINEPHRINE PF 1 MG/ML IJ SOLN
INTRAMUSCULAR | Status: DC | PRN
  Administered 2018-06-11: 1 mg via ENDOTRACHEOPULMONARY

## 2018-06-11 MED ORDER — FENTANYL CITRATE (PF) 100 MCG/2ML IJ SOLN: 25.0000 ug | INTRAMUSCULAR | Status: DC | PRN

## 2018-06-11 MED ORDER — ROCURONIUM BROMIDE 50 MG/5ML IV SOSY
PREFILLED_SYRINGE | INTRAVENOUS | Status: DC | PRN
  Administered 2018-06-11: 50 mg via INTRAVENOUS
  Administered 2018-06-11: 10 mg via INTRAVENOUS

## 2018-06-11 MED ORDER — ONDANSETRON HCL 4 MG/2ML IJ SOLN
INTRAMUSCULAR | Status: DC | PRN
  Administered 2018-06-11: 4 mg via INTRAVENOUS

## 2018-06-11 MED ORDER — FENTANYL CITRATE (PF) 250 MCG/5ML IJ SOLN
INTRAMUSCULAR | Status: DC | PRN
  Administered 2018-06-11 (×3): 50 ug via INTRAVENOUS

## 2018-06-11 MED ORDER — ONDANSETRON HCL 4 MG/2ML IJ SOLN
INTRAMUSCULAR | Status: AC
  Filled 2018-06-11: qty 4

## 2018-06-11 MED ORDER — EPINEPHRINE PF 1 MG/ML IJ SOLN
INTRAMUSCULAR | Status: AC
  Filled 2018-06-11: qty 2

## 2018-06-11 MED ORDER — PROPOFOL 10 MG/ML IV BOLUS
INTRAVENOUS | Status: DC | PRN
  Administered 2018-06-11: 200 mg via INTRAVENOUS

## 2018-06-11 SURGICAL SUPPLY — 47 items
ADAPTER BRONCHOSCOPE OLYMPUS (ADAPTER) ×3 IMPLANT
ADAPTER VALVE BIOPSY EBUS (MISCELLANEOUS) IMPLANT
ADPTR VALVE BIOPSY EBUS (MISCELLANEOUS)
BRUSH BIOPSY BRONCH 10 SDTNB (MISCELLANEOUS) ×2 IMPLANT
BRUSH BIOPSY BRONCH 10MM SDTNB (MISCELLANEOUS) ×1
BRUSH SUPERTRAX BIOPSY (INSTRUMENTS) IMPLANT
BRUSH SUPERTRAX NDL-TIP CYTO (INSTRUMENTS) ×6 IMPLANT
CANISTER SUCT 3000ML PPV (MISCELLANEOUS) ×3 IMPLANT
CHANNEL WORK EXTEND EDGE 180 (KITS) IMPLANT
CHANNEL WORK EXTEND EDGE 90 (KITS) IMPLANT
CONT SPEC 4OZ CLIKSEAL STRL BL (MISCELLANEOUS) ×12 IMPLANT
COVER BACK TABLE 60X90IN (DRAPES) ×3 IMPLANT
COVER WAND RF STERILE (DRAPES) IMPLANT
FILTER STRAW FLUID ASPIR (MISCELLANEOUS) IMPLANT
FORCEPS BIOP SUPERTRX PREMAR (INSTRUMENTS) ×3 IMPLANT
GAUZE SPONGE 4X4 12PLY STRL (GAUZE/BANDAGES/DRESSINGS) ×3 IMPLANT
GLOVE SURG SIGNA 7.5 PF LTX (GLOVE) ×3 IMPLANT
GOWN STRL REUS W/ TWL XL LVL3 (GOWN DISPOSABLE) ×1 IMPLANT
GOWN STRL REUS W/TWL XL LVL3 (GOWN DISPOSABLE) ×2
KIT CLEAN ENDO COMPLIANCE (KITS) ×3 IMPLANT
KIT MARKER FIDUCIAL DELIVERY (KITS) ×3 IMPLANT
KIT PROCEDURE EDGE 180 (KITS) ×3 IMPLANT
KIT PROCEDURE EDGE 90 (KITS) IMPLANT
KIT TURNOVER KIT B (KITS) ×3 IMPLANT
MARKER FIDUCIAL SL NIT COIL (Implant Marker) ×9 IMPLANT
MARKER SKIN DUAL TIP RULER LAB (MISCELLANEOUS) ×3 IMPLANT
NEEDLE HYPO 25GX1X1/2 BEV (NEEDLE) ×3 IMPLANT
NEEDLE SUPERTRX PREMARK BIOPSY (NEEDLE) ×3 IMPLANT
NS IRRIG 1000ML POUR BTL (IV SOLUTION) ×3 IMPLANT
OIL SILICONE PENTAX (PARTS (SERVICE/REPAIRS)) ×3 IMPLANT
PAD ARMBOARD 7.5X6 YLW CONV (MISCELLANEOUS) ×6 IMPLANT
PATCHES PATIENT (LABEL) ×9 IMPLANT
SYR 20CC LL (SYRINGE) ×3 IMPLANT
SYR 20ML ECCENTRIC (SYRINGE) ×3 IMPLANT
SYR 30ML LL (SYRINGE) ×3 IMPLANT
SYR 3ML LL SCALE MARK (SYRINGE) ×3 IMPLANT
SYR 5ML LL (SYRINGE) ×3 IMPLANT
TOWEL GREEN STERILE (TOWEL DISPOSABLE) IMPLANT
TOWEL GREEN STERILE FF (TOWEL DISPOSABLE) ×3 IMPLANT
TRAP SPECIMEN MUCOUS 40CC (MISCELLANEOUS) ×3 IMPLANT
TUBE CONNECTING 20'X1/4 (TUBING) ×2
TUBE CONNECTING 20X1/4 (TUBING) ×4 IMPLANT
UNDERPAD 30X30 (UNDERPADS AND DIAPERS) ×3 IMPLANT
VALVE BIOPSY  SINGLE USE (MISCELLANEOUS) ×2
VALVE BIOPSY SINGLE USE (MISCELLANEOUS) ×1 IMPLANT
VALVE SUCTION BRONCHIO DISP (MISCELLANEOUS) ×3 IMPLANT
WATER STERILE IRR 1000ML POUR (IV SOLUTION) ×3 IMPLANT

## 2018-06-11 NOTE — Transfer of Care (Addendum)
Immediate Anesthesia Transfer of Care Note  Patient: Dylan Harvey  Procedure(s) Performed: VIDEO BRONCHOSCOPY WITH ENDOBRONCHIAL NAVIGATION (N/A ) PLACEMENT OF FUDUCIAL (N/A )  Patient Location: PACU  Anesthesia Type:General  Level of Consciousness: awake, alert  and oriented  Airway & Oxygen Therapy: Patient Spontanous Breathing and Patient connected to face mask oxygen  Post-op Assessment: Report given to RN and Post -op Vital signs reviewed and stable  Post vital signs: Reviewed and stable  Last Vitals:  Vitals Value Taken Time  BP 123/78 06/11/2018 12:20 PM  Temp 36.3 C 06/11/2018 12:20 PM  Pulse 72 06/11/2018 12:27 PM  Resp 20 06/11/2018 12:27 PM  SpO2 94 % 06/11/2018 12:27 PM  Vitals shown include unvalidated device data.  Last Pain:  Vitals:   06/11/18 1220  PainSc: Asleep         Complications: No apparent anesthesia complications

## 2018-06-11 NOTE — Discharge Instructions (Addendum)
Do not drive or engage in heavy physical activity for 24 hours  You may resume normal activities tomorrow  You may use acetaminophen (Tylenol) if needed for discomfort. You may use an over the counter cough medication if needed  You may cough up small amounts of blood over the next few days  Call [phone] if you develop chest pain, shortness of breath, fever > 101 F or cough up more than 2 tablespoons of blood  My office will contact you with follow up information

## 2018-06-11 NOTE — Anesthesia Postprocedure Evaluation (Signed)
Anesthesia Post Note  Patient: Dylan Harvey  Procedure(s) Performed: VIDEO BRONCHOSCOPY WITH ENDOBRONCHIAL NAVIGATION (N/A ) PLACEMENT OF FUDUCIAL (N/A )     Patient location during evaluation: PACU Anesthesia Type: General Level of consciousness: awake and alert Pain management: pain level controlled Vital Signs Assessment: post-procedure vital signs reviewed and stable Respiratory status: spontaneous breathing, nonlabored ventilation, respiratory function stable and patient connected to nasal cannula oxygen Cardiovascular status: blood pressure returned to baseline and stable Postop Assessment: no apparent nausea or vomiting Anesthetic complications: no    Last Vitals:  Vitals:   06/11/18 1250 06/11/18 1307  BP: 125/78 125/76  Pulse: 68 70  Resp: 20 18  Temp: 36.4 C   SpO2: 96% 94%    Last Pain:  Vitals:   06/11/18 1250  PainSc: 0-No pain                 Barnet Glasgow

## 2018-06-11 NOTE — Interval H&P Note (Signed)
History and Physical Interval Note:  06/11/2018 10:59 AM  Dylan Harvey  has presented today for surgery, with the diagnosis of LUNG NODULE.  The various methods of treatment have been discussed with the patient and family. After consideration of risks, benefits and other options for treatment, the patient has consented to  Procedure(s): [place] (N/A) PLACEMENT OF FUDUCIAL (N/A) as a surgical intervention.  The patient's history has been reviewed, patient examined, no change in status, stable for surgery.  I have reviewed the patient's chart and labs.  Questions were answered to the patient's satisfaction.     Melrose Nakayama

## 2018-06-11 NOTE — Anesthesia Procedure Notes (Signed)
Procedure Name: Intubation Date/Time: 06/11/2018 11:13 AM Performed by: Imagene Riches, CRNA Pre-anesthesia Checklist: Patient identified, Emergency Drugs available, Suction available and Patient being monitored Patient Re-evaluated:Patient Re-evaluated prior to induction Oxygen Delivery Method: Circle System Utilized Preoxygenation: Pre-oxygenation with 100% oxygen Induction Type: IV induction Ventilation: Mask ventilation without difficulty Laryngoscope Size: Miller and 3 Grade View: Grade I Tube type: Oral Tube size: 8.5 mm Number of attempts: 1 Airway Equipment and Method: Stylet and Oral airway Placement Confirmation: ETT inserted through vocal cords under direct vision,  positive ETCO2 and breath sounds checked- equal and bilateral Secured at: 24 cm Tube secured with: Tape Dental Injury: Teeth and Oropharynx as per pre-operative assessment

## 2018-06-11 NOTE — Op Note (Signed)
NAMEDEMETRIOUS, RAINFORD MEDICAL RECORD [id number] ACCOUNT [id number] DATE OF BIRTH:1950-10-17 FACILITY: [facility] LOCATION: [facility] PHYSICIAN:Jahmai Finelli Chaya Jan, MD  OPERATIVE REPORT  DATE OF PROCEDURE:  06/11/2018  PREOPERATIVE DIAGNOSIS:  Left lower lobe lung nodule.  POSTOPERATIVE DIAGNOSIS:  Left lower lobe lung nodule.  PROCEDURE:  Electromagnetic navigational bronchoscopy with needle aspirations, brushings, transbronchial biopsies and fiducial placement.  SURGEON:  Modesto Charon, MD  ASSISTANT:  None.  ANESTHESIA:  General.  FINDINGS:  Needle aspirations appeared adequate for diagnostic purposes.  CLINICAL NOTE:  The patient is a 68 year old gentleman with a history of tobacco abuse, who has had pulmonary nodules.  Recently, a CT showed an increase in size of a left lower lobe lung nodule.  On PET CT, it was mildly hypermetabolic.  He was advised  to undergo navigational bronchoscopy for biopsy and fiducial placement.  The indications, risks, benefits, and alternatives were discussed in detail with the patient.  He understood and accepted the risks and agreed to proceed.  DESCRIPTION OF PROCEDURE:  The patient was brought to the operating room on 06/11/2018.  He had induction of general anesthesia and was intubated.  A timeout was performed.  Planning for the navigational bronchoscopy was performed prior to induction.   Flexible fiberoptic bronchoscopy was performed via the endotracheal tube.  It revealed normal endobronchial anatomy with no endobronchial lesions to the level of the subsegmental bronchi.  The locatable guide for navigation was placed.  Registration was  performed.  The scope then was advanced to the left lower lobe bronchus and the appropriate subsegmental bronchus was cannulated.  The catheter initially was within 5-6 mm of the nodule with good alignment.  All sampling was performed with fluoroscopy.   Total fluoroscopy time was 3.7 minutes with a total  of 33.75 milligrays of radiation was used.  Needle aspirations were performed first.  This was done 3 times.  Quick prep was performed on the slides and the specimens did appear adequate.  After every 2  to 3 samples, the locatable guide was reinserted to ensure continued alignment and proximity to the nodule.  Several times the catheter had to be repositioned before taking additional samples.  Brushings were performed with a triple brush and multiple  biopsies were taken.  All the biopsies were sent for permanent pathology.  Final inspection was made with fluoroscopy.  There was no evidence of pneumothorax.  There was no ongoing bleeding.  The scope was removed.    The patient was extubated in the operating room and taken to the [facility] in good condition.  AN/NUANCE  D:06/11/2018 T:06/11/2018 JOB:[id number]

## 2018-06-11 NOTE — Brief Op Note (Signed)
06/11/2018  12:15 PM  PATIENT:  Lovena Neighbours  68 y.o. male  PRE-OPERATIVE DIAGNOSIS:  LEFT LOWER LOBE LUNG NODULE  POST-OPERATIVE DIAGNOSIS:  LEFT LOWER LOBE LUNG NODULE  PROCEDURE:  ELECTROMAGNETIC NAVIGATIONAL BRONCHOSCOPY WITH NEEDLE ASPIRATIONS< BRUSHINGS AND TRANSBRONCHIAL BIOPSIES FIDUCIAL PLACEMENT  SURGEON:  Surgeon(s) and Role:    Melrose Nakayama, MD - Primary  PHYSICIAN ASSISTANT:   ASSISTANTS: none   ANESTHESIA:   general  EBL:  5 mL   BLOOD ADMINISTERED:none  DRAINS: none   LOCAL MEDICATIONS USED:  NONE  SPECIMEN: Left lower lobe nodule  DISPOSITION OF SPECIMEN:  PATHOLOGY  COUNTS:  NO endoscopic  TOURNIQUET:  * No tourniquets in log *  DICTATION: .Other Dictation: Dictation Number -  PLAN OF CARE: Discharge to home after PACU  PATIENT DISPOSITION:  PACU - hemodynamically stable.   Delay start of Pharmacological VTE agent (>24hrs) due to surgical blood loss or risk of bleeding: not applicable

## 2018-06-11 NOTE — Telephone Encounter (Signed)
Patient's wife, Joycelyn Schmid contacted the office requesting biopsy results.  Patient was discharge from the hospital today s/p Bronchoscopy/ENB and biopsy by Dr. Roxan Hockey. I advised her that the results are not back and she would receive a phone call with those results when they are back.  She acknowledged receipt.

## 2018-06-12 ENCOUNTER — Encounter (HOSPITAL_COMMUNITY): Payer: Self-pay | Admitting: Thoracic Surgery (Cardiothoracic Vascular Surgery)

## 2018-06-17 ENCOUNTER — Other Ambulatory Visit: Payer: Self-pay

## 2018-06-17 ENCOUNTER — Encounter: Payer: Medicare Other | Admitting: Thoracic Surgery (Cardiothoracic Vascular Surgery)

## 2018-06-17 ENCOUNTER — Ambulatory Visit (INDEPENDENT_AMBULATORY_CARE_PROVIDER_SITE_OTHER): Payer: Medicare Other | Admitting: Thoracic Surgery (Cardiothoracic Vascular Surgery)

## 2018-06-17 VITALS — BP 118/69 | HR 100 | Temp 96.7°F | Resp 20 | Ht 72.0 in | Wt 158.0 lb

## 2018-06-17 DIAGNOSIS — Z9889 Other specified postprocedural states: Secondary | ICD-10-CM

## 2018-06-17 DIAGNOSIS — R911 Solitary pulmonary nodule: Secondary | ICD-10-CM

## 2018-06-17 NOTE — Progress Notes (Addendum)
[address]       [place] [address]             [phone]     HPI: Mr. Steward returns for a scheduled follow-up visit  Zygmund Passero is a 68 year old gentleman with a history of ongoing tobacco abuse (50 pack years), hypertension, hyperlipidemia, and arthritis.  He has had waxing and waning lung nodules dating back to 2013.  In December 2018 there was a nodule that was mildly hypermetabolic on PET/CT but that nodule ultimately resolved.  About 6 months ago CT showed a new 5 mm left lower lobe lung nodule.  A repeat CT in February showed the nodule had increased to 10 x 7 x 10 mm.  On PET CT it was mildly hypermetabolic with an SUV of 1.5.  I did a navigational bronchoscopy and fiducial placement on 06/11/2018.  The procedure was uncomplicated.  He now returns to discuss the results.  Past Medical History:  Diagnosis Date  . Allergy   . Arthritis    thumbs  . Arthritis   . Colonic adenoma 2009  . ED (erectile dysfunction)   . Emphysema lung (HCC) MILD   . History of colon polyps   . Hyperlipemia   . Hypertension   . Lumbago   . Tobacco abuse     Current Outpatient Medications  Medication Sig Dispense Refill  . Alfalfa 500 MG TABS Take 2,000-2,500 tablets by mouth See admin instructions. Take 2000 mg by mouth at lunch, 2000 mg at dinner and 2500 mgbefore bed    . b complex vitamins tablet Take 1 tablet by mouth daily.    . Chloramphenicol POWD Place 2 puffs into both ears 2 (two) times a week. Wed and Saturday    . cholecalciferol (VITAMIN D) 1000 units tablet Take 1,000 Units by mouth daily.    . fexofenadine (ALLEGRA) 60 MG tablet Take 60 mg by mouth 2 (two) times daily.    . Flaxseed, Linseed, (RA FLAX SEED OIL 1000 PO) Take 1,000 mg by mouth daily.     Nyoka Cowden Tea, Camellia sinensis, (GREEN TEA PO) Take 500 mg by mouth daily.    . Lecithin 1200 MG CAPS Take 1,200 mg by mouth daily.    Marland Kitchen lisinopril (PRINIVIL,ZESTRIL) 5 MG tablet Take 5 mg by mouth  daily.    . Multiple Vitamin (MULTIVITAMIN WITH MINERALS) TABS Take 1 tablet by mouth daily.    . Omega-3 Fatty Acids (FISH OIL) 1200 MG CAPS Take 1,200 mg by mouth daily.    . potassium gluconate 595 (99 K) MG TABS tablet Take 99 mg by mouth every other day.     . Probiotic Product (PROBIOTIC PO) Take 1 capsule by mouth daily. 20 billion with 4 strains    . PSYLLIUM HUSK PO Take 4 tablets by mouth 3 (three) times daily. 4 tabs lunch, 4 dinner, 4 one hour before bed     . Selenium 200 MCG CAPS Take 200 mcg by mouth 2 (two) times a week.     . sodium chloride (OCEAN) 0.65 % SOLN nasal spray Place 1 spray into the nose daily.    . Turmeric 500 MG TABS Take 500 mg by mouth every other day.    . vitamin C (ASCORBIC ACID) 500 MG tablet Take 500 mg by mouth daily.    Marland Kitchen zinc gluconate 50 MG tablet Take 50 mg by mouth daily.     No current facility-administered medications  for this visit.     Physical Exam BP 118/69   Pulse 100   Temp (!) 96.7 F (35.9 C) (Oral)   Resp 20   Ht 6' (1.829 m)   Wt 158 lb (71.7 kg)   BMI 21.30 kg/m  68 year old man in no acute distress Alert and oriented x3 with no focal deficits Lungs clear  Diagnostic Tests: Brushings negative. Needle aspirations and biopsies pending  Impression: Mr. Pallone is a 68 year old smoker with a 1 cm nodule centrally in the left lower lobe.  He has had waxing and waning lung nodules dating back about 6 years.  This nodule did grow over 6 months.  Time from about 5 mm to 10 mm.  It is also mildly hypermetabolic by PET CT.  If not for his history of having nodules that had grown and then resolved I would be almost certain this is a primary bronchogenic carcinoma.  Given his history, this nodule could be benign, but it was suspicious enough to warrant a biopsy.  Unfortunately the needle aspirations and biopsy results are not back yet.  The needle aspirations were more suspicious than the brushings on the intraoperative findings.  I did  discuss possible options depending on the results of the pending test.  If they are negative we could consider continued radiographic follow-up.  However if positive he would need treatment.  Options include surgical resection versus stereotactic radiation.  Given that the nodule is relatively central, I suspect he would need a lobectomy.  The other option would be stereotactic radiation.  We did discuss the relative advantages and disadvantages of each of those approaches.  Plan: We will call patient once results are available for needle aspirations and biopsies  Melrose Nakayama, MD [facility] [phone]  I received a phone call from pathology.  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  I called Mr. Kaser and informed him of the findings.  He is interested in talking with radiation oncology before deciding how to proceed.  I spoke with Norton Blizzard, she will arrange for radiation oncology consultation.  Revonda Standard Roxan Hockey, MD [facility] [phone]

## 2018-06-18 ENCOUNTER — Encounter: Payer: Self-pay | Admitting: *Deleted

## 2018-06-18 DIAGNOSIS — R911 Solitary pulmonary nodule: Secondary | ICD-10-CM

## 2018-06-18 NOTE — Progress Notes (Signed)
Oncology Nurse Navigator Documentation  Oncology Nurse Navigator Flowsheets 03/09/2017  Navigator Location [facility]  Referral date to RadOnc/MedOnc 03/09/2017  Navigator Encounter Type Other/per Dr. Roxan Hockey referral to rad onc completed.  I will update their team on referral.   Barriers/Navigation Needs Coordination of Care  Interventions Coordination of Care  Acuity Level 1  Time Spent with Patient 15

## 2018-06-19 ENCOUNTER — Other Ambulatory Visit: Payer: Self-pay | Admitting: *Deleted

## 2018-06-19 DIAGNOSIS — R310 Gross hematuria: Secondary | ICD-10-CM | POA: Diagnosis not present

## 2018-06-19 DIAGNOSIS — N281 Cyst of kidney, acquired: Secondary | ICD-10-CM | POA: Diagnosis not present

## 2018-06-19 NOTE — Progress Notes (Signed)
The proposed treatment discussed in cancer conference 06/19/2018 is for discussion purpose only and is not a binding recommendation.  The patient was not physically examined nor present for their treatment options.  Therefore, final treatment plans cannot be decided.

## 2018-06-30 NOTE — Progress Notes (Signed)
Thoracic Location of Tumor / Histology: left lower lobe lung   Patient presented with symptoms of: Dylan Harvey is a 68 year old gentleman with a longstanding history of tobacco abuse who has had multiple lung nodules.  He was first found to have lung nodules back in 2013.  He has been followed since then.  Some of the nodules have waxed and waned over time.  On his previous CT scan 6 months ago there was a new left lower lobe nodule.  It was about 5 mm at that time.  On follow-up CT today that nodule has increased in size to 10 x 7 x 10 mm.  Given his history of tobacco abuse that nodule is very concerning for a primary bronchogenic carcinoma.  Biopsies revealed: 06/11/18:  Diagnosis Lung, biopsy, Left lower - POORLY DIFFERENTIATED CARCINOMA  Tobacco/Marijuana/Snuff/ETOH use: He says he is currently smoking about 1/2 pack of cigarettes daily.  He has not been able to quit altogether.   Past/Anticipated interventions by cardiothoracic surgery, if any: 06/11/18: PROCEDURE:  Electromagnetic navigational bronchoscopy with needle aspirations, brushings, transbronchial biopsies and fiducial placement.  SURGEON:  Modesto Charon, MD   Past/Anticipated interventions by medical oncology, if any: None at this time  Signs/Symptoms  Weight changes, if any: No  Respiratory complaints, if any:He denies chest pain, pressure, tightness or shortness of breath with exertion. He has occasional cough but nothing out of the ordinary.  He has not had any fevers or chills or night sweats.  Hemoptysis, if any: pt denies  Pain issues, if any:  Pt denies  SAFETY ISSUES:  Prior radiation? No  Pacemaker/ICD? No  Possible current pregnancy? N/A  Is the patient on methotrexate? No  Current Complaints / other details:  Pt presents today for initial consult with Dr. Sondra Come for Radiation Oncology. Pt is unaccompanied.   BP (!) 144/83 (BP Location: Left Arm, Patient Position: Sitting)   Pulse 82   Temp 98.1  F (36.7 C) (Oral)   Resp 20   Ht 6' (1.829 m)   Wt 156 lb 6.4 oz (70.9 kg)   SpO2 100%   BMI 21.21 kg/m   Wt Readings from Last 3 Encounters:  07/02/18 156 lb 6.4 oz (70.9 kg)  06/17/18 158 lb (71.7 kg)  06/11/18 158 lb (71.7 kg)   Loma Sousa, RN BSN

## 2018-07-02 ENCOUNTER — Encounter: Payer: Self-pay | Admitting: Radiation Oncology

## 2018-07-02 ENCOUNTER — Ambulatory Visit
Admission: RE | Admit: 2018-07-02 | Discharge: 2018-07-02 | Disposition: A | Discharge status: Home / Self Care | Payer: Medicare Other | Source: Ambulatory Visit | Attending: Radiation Oncology | Admitting: Radiation Oncology

## 2018-07-02 ENCOUNTER — Other Ambulatory Visit: Payer: Self-pay

## 2018-07-02 DIAGNOSIS — Z801 Family history of malignant neoplasm of trachea, bronchus and lung: Secondary | ICD-10-CM | POA: Diagnosis not present

## 2018-07-02 DIAGNOSIS — C3432 Malignant neoplasm of lower lobe, left bronchus or lung: Secondary | ICD-10-CM | POA: Insufficient documentation

## 2018-07-02 DIAGNOSIS — F17210 Nicotine dependence, cigarettes, uncomplicated: Secondary | ICD-10-CM | POA: Diagnosis not present

## 2018-07-02 NOTE — Patient Instructions (Signed)
Coronavirus (COVID-19) Are you at risk?  Are you at risk for the Coronavirus (COVID-19)?  To be considered HIGH RISK for Coronavirus (COVID-19), you have to meet the following criteria:  . Traveled to [place], [place], [place], [place] or [place]; or in the [place] to [place], [place], [place], or [place]; and have fever, cough, and shortness of breath within the last 2 weeks of travel OR . Been in close contact with a person diagnosed with COVID-19 within the last 2 weeks and have fever, cough, and shortness of breath . IF YOU DO NOT MEET THESE CRITERIA, YOU ARE CONSIDERED LOW RISK FOR COVID-19.  What to do if you are HIGH RISK for COVID-19?  . If you are having a medical emergency, call 911. . Seek medical care right away. Before you go to a doctor's office, urgent care or emergency department, call ahead and tell them about your recent travel, contact with someone diagnosed with COVID-19, and your symptoms. You should receive instructions from your physician's office regarding next steps of care.  . When you arrive at healthcare provider, tell the healthcare staff immediately you have returned from visiting [place], [place], [place], [place] or [place]; or traveled in the [place] to [place], [place], [place], or [place]; in the last two weeks or you have been in close contact with a person diagnosed with COVID-19 in the last 2 weeks.   . Tell the health care staff about your symptoms: fever, cough and shortness of breath. . After you have been seen by a medical provider, you will be either: o Tested for (COVID-19) and discharged home on quarantine except to seek medical care if symptoms worsen, and asked to  - Stay home and avoid contact with others until you get your results (4-5 days)  - Avoid travel on public transportation if possible (such as bus, train, or airplane) or o Sent to the Emergency Department by EMS for evaluation, COVID-19 testing, and possible  admission depending on your condition and test results.  What to do if you are LOW RISK for COVID-19?  Reduce your risk of any infection by using the same precautions used for avoiding the common cold or flu:  . Wash your hands often with soap and warm water for at least 20 seconds.  If soap and water are not readily available, use an alcohol-based hand sanitizer with at least 60% alcohol.  . If coughing or sneezing, cover your mouth and nose by coughing or sneezing into the elbow areas of your shirt or coat, into a tissue or into your sleeve (not your hands). . Avoid shaking hands with others and consider head nods or verbal greetings only. . Avoid touching your eyes, nose, or mouth with unwashed hands.  . Avoid close contact with people who are sick. . Avoid places or events with large numbers of people in one location, like concerts or sporting events. . Carefully consider travel plans you have or are making. . If you are planning any travel outside or inside the [place], visit the CDC's Travelers' Health webpage for the latest health notices. . If you have some symptoms but not all symptoms, continue to monitor at home and seek medical attention if your symptoms worsen. . If you are having a medical emergency, call 911.   ADDITIONAL HEALTHCARE OPTIONS FOR PATIENTS  [facility] / e-Visit: [email or web address]         [facility]: [phone]  [facility]: [phone]                   [facility]: [phone]   

## 2018-07-02 NOTE — Progress Notes (Signed)
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  HISTORY OF PRESENT ILLNESS::Dylan Harvey is a 68 y.o. male who is unaccompanied. The patient has a history of multiple lung nodules followed by his PCP, Dr. Joylene Draft, since 2013. He met with Dr. Roxan Hockey on 03/25/2017 and they opted for radiographic surveillance.   Unfortunately, a new nodule in the left lower lobe measuring 5 mm was seen on surveillance CT on 11/06/2017. On repeat scan on 05/07/2018, the nodule showed interval progression, measuring 10 mm. He proceeded to PET scan on 05/26/2018, which showed the enlarging mildly hypermetabolic left lower lobe nodule concerning for bronchogenic carcinoma and no evidence of metastatic mediastinal adenopathy.  He proceeded to bronchoscopy on 06/11/2018 under Dr. Roxan Hockey. Pathology from the procedure showed poorly differentiated carcinoma.  He is seen today out of courtesy for Dr. Roxan Hockey to discuss possible radiation treatment options.   PREVIOUS RADIATION THERAPY: No  PAST MEDICAL HISTORY:  has a past medical history of Allergy, Arthritis, Arthritis, Colonic adenoma (2009), ED (erectile dysfunction), Emphysema lung (HCC) MILD, History of colon polyps, Hyperlipemia, Hypertension, Lumbago, and Tobacco abuse.    PAST SURGICAL HISTORY: Past Surgical History:  Procedure Laterality Date   COLONOSCOPY     FUDUCIAL PLACEMENT N/A 06/11/2018   Procedure: PLACEMENT OF FUDUCIAL;  Surgeon: Melrose Nakayama, MD;  Location: [facility];  Service: Thoracic;  Laterality: N/A;   HERNIA REPAIR     inguinal   INNER EAR SURGERY     POLYPECTOMY     VIDEO BRONCHOSCOPY WITH ENDOBRONCHIAL NAVIGATION N/A 06/11/2018   Procedure: VIDEO BRONCHOSCOPY WITH ENDOBRONCHIAL NAVIGATION;  Surgeon: Melrose Nakayama, MD;  Location: [facility];  Service: Thoracic;  Laterality: N/A;   WISDOM TOOTH EXTRACTION      FAMILY HISTORY: family history includes Alzheimer's disease in his mother; Hypothyroidism in his mother; Lung cancer in his father; Prostate cancer in his father; Severe combined immunodeficiency in his mother.  SOCIAL HISTORY:  reports that he has been smoking cigarettes. He has been smoking about 1.00 pack per day. He has never used smokeless tobacco. He reports current alcohol use of about 2.0 standard drinks of alcohol per week. He reports that he does not use drugs.  ALLERGIES: Neomycin-bacitracin zn-polymyx  MEDICATIONS:  Current Outpatient Medications  Medication Sig Dispense Refill   Alfalfa 500 MG TABS Take 2,000-2,500 tablets by mouth See admin instructions. Take 2000 mg by mouth at lunch, 2000 mg at dinner and 2500 mgbefore bed     b complex vitamins tablet Take 1 tablet by mouth daily.     Chloramphenicol POWD Place 2 puffs into both ears 2 (two) times a week. Wed and Saturday     cholecalciferol (VITAMIN D) 1000 units tablet Take 1,000 Units by mouth daily.     fexofenadine (ALLEGRA) 60 MG tablet Take 60 mg by mouth 2 (two) times daily.     Flaxseed, Linseed, (RA FLAX SEED OIL 1000 PO) Take 1,000 mg by mouth daily.      Green Tea, Camellia sinensis, (GREEN TEA PO) Take 500 mg by mouth daily.     Lecithin 1200  MG CAPS Take 1,200 mg by mouth daily.     lisinopril (PRINIVIL,ZESTRIL) 5 MG tablet Take 5 mg by mouth daily.     Multiple Vitamin (MULTIVITAMIN WITH MINERALS) TABS Take 1 tablet by mouth daily.     Omega-3 Fatty Acids (FISH OIL) 1200 MG CAPS Take 1,200 mg by mouth daily.     potassium gluconate 595 (99 K) MG TABS tablet Take 99 mg by mouth every other day.        Probiotic Product (PROBIOTIC PO) Take 1 capsule by mouth daily. 20 billion with 4 strains     PSYLLIUM HUSK PO Take 4 tablets by mouth 3 (three) times daily. 4 tabs lunch, 4 dinner, 4 one hour before bed      Selenium 200 MCG CAPS Take 200 mcg by mouth 2 (two) times a week.      sodium chloride (OCEAN) 0.65 % SOLN nasal spray Place 1 spray into the nose daily.     Turmeric 500 MG TABS Take 500 mg by mouth every other day.     vitamin C (ASCORBIC ACID) 500 MG tablet Take 500 mg by mouth daily.     zinc gluconate 50 MG tablet Take 50 mg by mouth daily.     No current facility-administered medications for this encounter.     REVIEW OF SYSTEMS:  A 10+ POINT REVIEW OF SYSTEMS WAS OBTAINED including neurology, dermatology, psychiatry, cardiac, respiratory, lymph, extremities, GI, GU, musculoskeletal, constitutional, reproductive, HEENT. He denies any weight changes, chest pain, pressure, or tightness, shortness of breath with exertion, fevers, chills, night sweats, hemoptysis, and pain issues. He notes an occasional cough but nothing out of the ordinary.   PHYSICAL EXAM:  height is 6' (1.829 m) and weight is 156 lb 6.4 oz (70.9 kg). His oral temperature is 98.1 F (36.7 C). His blood pressure is 144/83 (abnormal) and his pulse is 82. His respiration is 20 and oxygen saturation is 100%.   General: Alert and oriented, in no acute distress HEENT: Head is normocephalic. Extraocular movements are intact. Oropharynx is clear. Neck: Neck is supple, no palpable cervical or supraclavicular lymphadenopathy. Heart: Regular in rate and rhythm with no murmurs, rubs, or gallops. Chest: Clear to auscultation bilaterally, with no rhonchi, wheezes, or rales. Abdomen: Soft, nontender, nondistended, with no rigidity or guarding. Extremities: No cyanosis or edema. Lymphatics: see Neck Exam Skin: No concerning lesions. Musculoskeletal: symmetric strength and muscle tone throughout. Neurologic: Cranial nerves  II through XII are grossly intact. No obvious focalities. Speech is fluent. Coordination is intact. Psychiatric: Judgment and insight are intact. Affect is appropriate.   ECOG = 0  0 - Asymptomatic (Fully active, able to carry on all predisease activities without restriction)  1 - Symptomatic but completely ambulatory (Restricted in physically strenuous activity but ambulatory and able to carry out work of a light or sedentary nature. For example, light housework, office work)  2 - Symptomatic, <50% in bed during the day (Ambulatory and capable of all self care but unable to carry out any work activities. Up and about more than 50% of waking hours)  3 - Symptomatic, >50% in bed, but not bedbound (Capable of only limited self-care, confined to bed or chair 50% or more of waking hours)  4 - Bedbound (Completely disabled. Cannot carry on any self-care. Totally confined to bed or chair)  5 - Death   Eustace Pen MM, Creech RH, Tormey DC, et al. [phone]). "Toxicity and response criteria of the [facility]". Am.  J. Clin. Oncol. 5 (6): 649-55  LABORATORY DATA:  Lab Results  Component Value Date   WBC 8.1 06/04/2018   HGB 13.6 06/04/2018   HCT 39.4 06/04/2018   MCV 95.2 06/04/2018   PLT 190 06/04/2018   NEUTROABS 9.4 (H) 03/19/2012   Lab Results  Component Value Date   NA 134 (L) 06/04/2018   K 3.9 06/04/2018   CL 98 06/04/2018   CO2 28 06/04/2018   GLUCOSE 149 (H) 06/04/2018   CREATININE 0.94 06/04/2018   CALCIUM 9.5 06/04/2018      RADIOGRAPHY: No results found.    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 The patient has met with Dr. Roxan Hockey and does not wish to consider surgical management, even though he may be a candidate for surgery.  Today, I talked to the  patient and wife remotely about the findings and work-up thus far.  We discussed the natural history of lung cancer and general treatment, highlighting the role of radiotherapy (SBRT) in the management.  We discussed the available radiation techniques, and focused on the details of logistics and delivery.  We reviewed the anticipated acute and late sequelae associated with radiation in this setting.  The patient was encouraged to ask questions that I answered to the best of my ability.  A patient consent form was discussed and signed.  We retained a copy for our records.  The patient would like to proceed with radiation and will be scheduled for CT simulation. We discussed initiation of his therapy as soon as possible versus delaying initiation given the current Covid-19 pandemic. He does not wish to delay his therapy, he feels most comfortable starting as soon as possible. There is a shortage in our department of SBRT positioning devices, so we will need to delay his CT simulation until 07/16/2018 with treatments to begin approximately a week later. The patient will be moved up if an unexpected opening occurs prior to this time.  PLAN: SBRT simulation 07/16/2018 at 10:30 am. Anticipate 3 SBRT treatments.    ------------------------------------------------  Blair Promise, PhD, MD  This document serves as a record of services personally performed by Gery Pray, MD. It was created on his behalf by Wilburn Mylar, a trained medical scribe. The creation of this record is based on the scribe's personal observations and the provider's statements to them. This document has been checked and approved by the attending provider.

## 2018-07-14 DIAGNOSIS — C3432 Malignant neoplasm of lower lobe, left bronchus or lung: Secondary | ICD-10-CM | POA: Insufficient documentation

## 2018-07-16 ENCOUNTER — Other Ambulatory Visit: Payer: Self-pay

## 2018-07-16 ENCOUNTER — Ambulatory Visit
Admission: RE | Admit: 2018-07-16 | Discharge: 2018-07-16 | Disposition: A | Discharge status: Home / Self Care | Payer: Medicare Other | Source: Ambulatory Visit | Attending: Radiation Oncology | Admitting: Radiation Oncology

## 2018-07-16 DIAGNOSIS — Z510 Encounter for antineoplastic radiation therapy: Secondary | ICD-10-CM | POA: Insufficient documentation

## 2018-07-16 DIAGNOSIS — C3432 Malignant neoplasm of lower lobe, left bronchus or lung: Secondary | ICD-10-CM | POA: Diagnosis not present

## 2018-07-16 NOTE — Progress Notes (Signed)
  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  Identity was confirmed.  All relevant records and images related to the planned course of therapy were reviewed.  The patient freely provided informed written consent to proceed with treatment after reviewing the details related to the planned course of therapy. The consent form was witnessed and verified by the simulation staff.  Then, the patient was set-up in a stable reproducible  supine position for radiation therapy.  A BodyFix immobilization pillow was fabricated for reproducible positioning.  Then I personally applied the abdominal compression paddle to limit respiratory excursion.  4D respiratoy motion management CT images were obtained.  Surface markings were placed.  The CT images were loaded into the planning software.  Then, using Cine, MIP, and standard views, the internal target volume (ITV) and planning target volumes (PTV) were delinieated, and avoidance structures were contoured.  Treatment planning then occurred.  The radiation prescription was entered and confirmed.  A total of two complex treatment devices were fabricated in the form of the BodyFix immobilization pillow and a neck accuform cushion.  I have requested : 3D Simulation  I have requested a DVH of the following structures: Heart, Lungs, Esophagus, Chest Wall, Brachial Plexus, Major Blood Vessels, and targets.  PLAN:  The patient will receive 54 Gy in 3 fractions.  -----------------------------------  Blair Promise, PhD, MD This document serves as a record of services personally performed by Gery Pray, MD.  It was created on his behalf by Mary-Margaret Loma Messing, a trained medical scribe. The creation of this record is based on the scribe's personal observations and the provider's statements to them. This document has been checked and approved by the attending provider.

## 2018-07-22 DIAGNOSIS — Z510 Encounter for antineoplastic radiation therapy: Secondary | ICD-10-CM | POA: Insufficient documentation

## 2018-07-22 DIAGNOSIS — C3432 Malignant neoplasm of lower lobe, left bronchus or lung: Secondary | ICD-10-CM | POA: Diagnosis not present

## 2018-07-24 ENCOUNTER — Ambulatory Visit
Admission: RE | Admit: 2018-07-24 | Discharge: 2018-07-24 | Disposition: A | Discharge status: Home / Self Care | Payer: Medicare Other | Source: Ambulatory Visit | Attending: Radiation Oncology | Admitting: Radiation Oncology

## 2018-07-24 ENCOUNTER — Other Ambulatory Visit: Payer: Self-pay

## 2018-07-24 DIAGNOSIS — C3432 Malignant neoplasm of lower lobe, left bronchus or lung: Secondary | ICD-10-CM | POA: Diagnosis not present

## 2018-07-24 DIAGNOSIS — Z510 Encounter for antineoplastic radiation therapy: Secondary | ICD-10-CM | POA: Diagnosis not present

## 2018-07-24 NOTE — Progress Notes (Signed)
  Radiation Oncology         [phone]) [phone] ________________________________  Name: Dylan Harvey MRN: [id number]  Date: 07/24/2018  DOB: 1950/06/20  Stereotactic Body Radiotherapy Treatment Procedure Note  NARRATIVE:  Dylan Harvey was brought to the stereotactic radiation treatment machine and placed supine on the CT couch. The patient was set up for stereotactic body radiotherapy on the body fix pillow.  3D TREATMENT PLANNING AND DOSIMETRY:  The patient's radiation plan was reviewed and approved prior to starting treatment.  It showed 3-dimensional radiation distributions overlaid onto the planning CT.  The [facility] for the target structures as well as the organs at risk were reviewed. The documentation of this is filed in the radiation oncology EMR.  SIMULATION VERIFICATION:  The patient underwent CT imaging on the treatment unit.  These were carefully aligned to document that the ablative radiation dose would cover the target volume and maximally spare the nearby organs at risk according to the planned distribution.  SPECIAL TREATMENT PROCEDURE: Dylan Harvey received high dose ablative stereotactic body radiotherapy to the planned target volume without unforeseen complications. Treatment was delivered uneventfully. The high doses associated with stereotactic body radiotherapy and the significant potential risks require careful treatment set up and patient monitoring constituting a special treatment procedure   STEREOTACTIC TREATMENT MANAGEMENT:  Following delivery, the patient was evaluated clinically. The patient tolerated treatment without significant acute effects, and was discharged to home in stable condition.    PLAN: Continue treatment as planned.  ________________________________  Blair Promise, PhD, MD  This document serves as a record of services personally performed by Gery Pray, MD. It was created on his behalf by Rae Lips, a trained medical scribe. The creation  of this record is based on the scribe's personal observations and the provider's statements to them. This document has been checked and approved by the attending provider.

## 2018-07-28 ENCOUNTER — Ambulatory Visit
Admission: RE | Admit: 2018-07-28 | Discharge: 2018-07-28 | Disposition: A | Discharge status: Home / Self Care | Payer: Medicare Other | Source: Ambulatory Visit | Attending: Radiation Oncology | Admitting: Radiation Oncology

## 2018-07-28 ENCOUNTER — Other Ambulatory Visit: Payer: Self-pay

## 2018-07-28 DIAGNOSIS — Z510 Encounter for antineoplastic radiation therapy: Secondary | ICD-10-CM | POA: Diagnosis not present

## 2018-07-28 DIAGNOSIS — C3432 Malignant neoplasm of lower lobe, left bronchus or lung: Secondary | ICD-10-CM | POA: Diagnosis not present

## 2018-07-28 NOTE — Progress Notes (Signed)
  Radiation Oncology         [phone] ________________________________  Name: Dylan Harvey MRN: [id number]  Date: 07/28/2018  DOB: 06/28/1950  Stereotactic Body Radiotherapy Treatment Procedure Note  NARRATIVE:  Dylan Harvey was brought to the stereotactic radiation treatment machine and placed supine on the CT couch. The patient was set up for stereotactic body radiotherapy on the body fix pillow.  3D TREATMENT PLANNING AND DOSIMETRY:  The patient's radiation plan was reviewed and approved prior to starting treatment.  It showed 3-dimensional radiation distributions overlaid onto the planning CT.  The [facility] for the target structures as well as the organs at risk were reviewed. The documentation of this is filed in the radiation oncology EMR.  SIMULATION VERIFICATION:  The patient underwent CT imaging on the treatment unit.  These were carefully aligned to document that the ablative radiation dose would cover the target volume and maximally spare the nearby organs at risk according to the planned distribution.  SPECIAL TREATMENT PROCEDURE: Dylan Harvey received high dose ablative stereotactic body radiotherapy to the planned target volume without unforeseen complications. Treatment was delivered uneventfully. The high doses associated with stereotactic body radiotherapy and the significant potential risks require careful treatment set up and patient monitoring constituting a special treatment procedure   STEREOTACTIC TREATMENT MANAGEMENT:  Following delivery, the patient was evaluated clinically. The patient tolerated treatment without significant acute effects, and was discharged to home in stable condition.    PLAN: Continue treatment as planned.  ________________________________  Blair Promise, PhD, MD   This document serves as a record of services personally performed by Gery Pray, MD. It was created on his behalf by Mary-Margaret Loma Messing, a trained medical scribe. The  creation of this record is based on the scribe's personal observations and the provider's statements to them. This document has been checked and approved by the attending provider.

## 2018-07-31 ENCOUNTER — Encounter: Payer: Self-pay | Admitting: Radiation Oncology

## 2018-07-31 ENCOUNTER — Ambulatory Visit
Admission: RE | Admit: 2018-07-31 | Discharge: 2018-07-31 | Disposition: A | Discharge status: Home / Self Care | Payer: Medicare Other | Source: Ambulatory Visit | Attending: Radiation Oncology | Admitting: Radiation Oncology

## 2018-07-31 ENCOUNTER — Other Ambulatory Visit: Payer: Self-pay

## 2018-07-31 DIAGNOSIS — C3432 Malignant neoplasm of lower lobe, left bronchus or lung: Secondary | ICD-10-CM | POA: Diagnosis not present

## 2018-07-31 DIAGNOSIS — Z510 Encounter for antineoplastic radiation therapy: Secondary | ICD-10-CM | POA: Diagnosis not present

## 2018-07-31 NOTE — Progress Notes (Incomplete)
°  Radiation Oncology         (336) [phone] ________________________________  Name: Dylan Harvey MRN: [id number]  Date: 07/31/2018  DOB: 1950/07/10  End of Treatment Note  Diagnosis:   68 y.o. 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 The patient received 54 Gy in 3 fractions at 18 Gy per fraction.  Beams/energy:   SBRT/SRT-VMAT // 6X-FFF Photon  Narrative: The patient tolerated radiation treatment relatively well.   The patient did not have any signs of acute toxicity during treatment.  Plan: The patient has completed radiation treatment. The patient will return to radiation oncology clinic for routine followup in one month. I advised the patient to call or return sooner if they have any questions or concerns related to their recovery or treatment.   ------------------------------------------------  Blair Promise, PhD, MD  This document serves as a record of services personally performed by Gery Pray, MD. It was created on his behalf by Rae Lips, a trained medical scribe. The creation of this record is based on the scribe's personal observations and the provider's statements to them. This document has been checked and approved by the attending provider.

## 2018-07-31 NOTE — Progress Notes (Signed)
  Radiation Oncology         (336) [phone] ________________________________  Name: Dylan Harvey MRN: [id number]  Date: 07/31/2018  DOB: 07-17-1950  Stereotactic Body Radiotherapy Treatment Procedure Note  NARRATIVE:  Dylan Harvey was brought to the stereotactic radiation treatment machine and placed supine on the CT couch. The patient was set up for stereotactic body radiotherapy on the body fix pillow.  3D TREATMENT PLANNING AND DOSIMETRY:  The patient's radiation plan was reviewed and approved prior to starting treatment.  It showed 3-dimensional radiation distributions overlaid onto the planning CT.  The [facility] for the target structures as well as the organs at risk were reviewed. The documentation of this is filed in the radiation oncology EMR.  SIMULATION VERIFICATION:  The patient underwent CT imaging on the treatment unit.  These were carefully aligned to document that the ablative radiation dose would cover the target volume and maximally spare the nearby organs at risk according to the planned distribution.  SPECIAL TREATMENT PROCEDURE: Lovena Neighbours received high dose ablative stereotactic body radiotherapy to the planned target volume without unforeseen complications. Treatment was delivered uneventfully. The high doses associated with stereotactic body radiotherapy and the significant potential risks require careful treatment set up and patient monitoring constituting a special treatment procedure   STEREOTACTIC TREATMENT MANAGEMENT:  Following delivery, the patient was evaluated clinically. The patient tolerated treatment without significant acute effects, and was discharged to home in stable condition.    PLAN: The patient has completed SBRT. He will return to radiation oncology clinic for routine followup in one month.  ________________________________  Blair Promise, PhD, MD  This document serves as a record of services personally performed by Gery Pray, MD. It  was created on his behalf by Rae Lips, a trained medical scribe. The creation of this record is based on the scribe's personal observations and the provider's statements to them. This document has been checked and approved by the attending provider.

## 2018-08-06 DIAGNOSIS — H60311 Diffuse otitis externa, right ear: Secondary | ICD-10-CM | POA: Diagnosis not present

## 2018-08-06 DIAGNOSIS — H6123 Impacted cerumen, bilateral: Secondary | ICD-10-CM | POA: Diagnosis not present

## 2018-08-06 DIAGNOSIS — H6983 Other specified disorders of Eustachian tube, bilateral: Secondary | ICD-10-CM | POA: Diagnosis not present

## 2018-09-03 DIAGNOSIS — H6061 Unspecified chronic otitis externa, right ear: Secondary | ICD-10-CM | POA: Insufficient documentation

## 2018-09-03 DIAGNOSIS — H6063 Unspecified chronic otitis externa, bilateral: Secondary | ICD-10-CM | POA: Diagnosis not present

## 2018-09-04 ENCOUNTER — Encounter: Payer: Self-pay | Admitting: Radiation Oncology

## 2018-09-04 ENCOUNTER — Ambulatory Visit
Admission: RE | Admit: 2018-09-04 | Discharge: 2018-09-04 | Disposition: A | Discharge status: Home / Self Care | Payer: Medicare Other | Source: Ambulatory Visit | Attending: Radiation Oncology | Admitting: Radiation Oncology

## 2018-09-04 ENCOUNTER — Other Ambulatory Visit: Payer: Self-pay

## 2018-09-04 VITALS — BP 110/72 | HR 80 | Temp 98.5°F | Ht 72.0 in | Wt 154.6 lb

## 2018-09-04 DIAGNOSIS — Z923 Personal history of irradiation: Secondary | ICD-10-CM | POA: Insufficient documentation

## 2018-09-04 DIAGNOSIS — Z79899 Other long term (current) drug therapy: Secondary | ICD-10-CM | POA: Insufficient documentation

## 2018-09-04 DIAGNOSIS — C3432 Malignant neoplasm of lower lobe, left bronchus or lung: Secondary | ICD-10-CM

## 2018-09-04 NOTE — Progress Notes (Signed)
Radiation Oncology         [phone] ________________________________  Name: Dylan Harvey MRN: [id number]  Date: 09/04/2018  DOB: 05-08-1950  Follow-Up Visit Note  CC: Dylan Infante, MD  Dylan Infante, MD    ICD-10-CM   1. 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 The patient received 54 Gy in 3 fractions at 18 Gy per fraction.  Narrative:  The patient returns today for routine follow-up.  he is doing very well overall.                 On review of systems, he was completely asymptomatic. Pertinent positives are listed and detailed within the above HPI.                 ALLERGIES:  is allergic to neomycin-bacitracin zn-polymyx.  Meds: Current Outpatient Medications  Medication Sig Dispense Refill  . Alfalfa 500 MG TABS Take 2,000-2,500 tablets by mouth See admin instructions. Take 2000 mg by mouth at lunch, 2000 mg at dinner and 2500 mgbefore bed    . b complex vitamins tablet Take 1 tablet by mouth daily.    . Chloramphenicol CRYS Place in ear(s).    . cholecalciferol (VITAMIN D) 1000 units tablet Take 1,000 Units by mouth daily.    . fexofenadine (ALLEGRA) 60 MG tablet Take 60 mg by mouth 2 (two) times daily.    . Flaxseed, Linseed, (RA FLAX SEED OIL 1000 PO) Take 1,000 mg by mouth daily.     Nyoka Cowden Tea, Camellia sinensis, (GREEN TEA PO) Take 500 mg by mouth daily.    . Lecithin 1200 MG CAPS Take 1,200 mg by mouth daily.    Marland Kitchen lisinopril (PRINIVIL,ZESTRIL) 5 MG tablet Take 5 mg by mouth daily.    . Multiple Vitamin (MULTIVITAMIN WITH MINERALS) TABS Take 1 tablet by mouth daily.    . Omega-3 Fatty Acids  (FISH OIL) 1200 MG CAPS Take 1,200 mg by mouth daily.    . potassium gluconate 595 (99 K) MG TABS tablet Take 99 mg by mouth every other day.     . Probiotic Product (PROBIOTIC PO) Take 1 capsule by mouth daily. 20 billion with 4 strains    . PSYLLIUM HUSK PO Take 4 tablets by mouth 3 (three) times daily. 4 tabs lunch, 4 dinner, 4 one hour before bed     . Selenium 200 MCG CAPS Take 200 mcg by mouth 2 (two) times a week.     . sodium chloride (OCEAN) 0.65 % SOLN nasal spray Place 1 spray into the nose daily.    . Turmeric 500 MG TABS Take 500 mg by mouth every other day.    . vitamin C (ASCORBIC ACID) 500 MG tablet Take 500 mg by mouth daily.    Marland Kitchen zinc gluconate 50 MG tablet Take 50 mg by mouth daily.     No current facility-administered medications for this encounter.     Physical Findings: The patient is in no acute distress. Patient is alert and oriented.  height is 6' (1.829 m) and weight is 154 lb 9.6 oz (70.1 kg).  His oral temperature is 98.5 F (36.9 C). His blood pressure is 110/72 and his pulse is 80. His oxygen saturation is 100%. .  No significant changes. Lungs are clear to auscultation bilaterally. Heart has regular rate and rhythm. No palpable cervical, supraclavicular, or axillary adenopathy. Abdomen soft, non-tender, normal bowel sounds.   Lab Findings: Lab Results  Component Value Date   WBC 8.1 06/04/2018   HGB 13.6 06/04/2018   HCT 39.4 06/04/2018   MCV 95.2 06/04/2018   PLT 190 06/04/2018    Radiographic Findings: No results found.  Impression:  The patient is recovering from the effects of radiation.  He is doing quite well following his SBRT with no side effects from his treatment.   Plan:  Pt will be scheduled for a chest CT scan in 3 months and then a follow-up soon afterward.   ____________________________________   Blair Promise, PhD, MD    This document serves as a record of services personally performed by Gery Pray, MD. It was created on  his behalf by Mary-Margaret Loma Messing, a trained medical scribe. The creation of this record is based on the scribe's personal observations and the provider's statements to them. This document has been checked and approved by the attending provider.

## 2018-09-04 NOTE — Progress Notes (Signed)
Patient for follow up one month post SBRT. Denies any issues or complaints of.BP 110/72 (BP Location: Left Arm, Patient Position: Sitting)   Pulse 80   Temp 98.5 F (36.9 C) (Oral)   Ht 6' (1.829 m)   Wt 154 lb 9.6 oz (70.1 kg)   SpO2 100%   BMI 20.97 kg/m  Filed Weights   09/04/18 1001  Weight: 154 lb 9.6 oz (70.1 kg)

## 2018-09-04 NOTE — Patient Instructions (Signed)
Coronavirus (COVID-19) Are you at risk?  Are you at risk for the Coronavirus (COVID-19)?  To be considered HIGH RISK for Coronavirus (COVID-19), you have to meet the following criteria:  . Traveled to [place], [place], [place], [place] or [place]; or in the [place] to [place], [place], [place], or [place]; and have fever, cough, and shortness of breath within the last 2 weeks of travel OR . Been in close contact with a person diagnosed with COVID-19 within the last 2 weeks and have fever, cough, and shortness of breath . IF YOU DO NOT MEET THESE CRITERIA, YOU ARE CONSIDERED LOW RISK FOR COVID-19.  What to do if you are HIGH RISK for COVID-19?  . If you are having a medical emergency, call 911. . Seek medical care right away. Before you go to a doctor's office, urgent care or emergency department, call ahead and tell them about your recent travel, contact with someone diagnosed with COVID-19, and your symptoms. You should receive instructions from your physician's office regarding next steps of care.  . When you arrive at healthcare provider, tell the healthcare staff immediately you have returned from visiting [place], [place], [place], [place] or [place]; or traveled in the [place] to [place], [place], [place], or [place]; in the last two weeks or you have been in close contact with a person diagnosed with COVID-19 in the last 2 weeks.   . Tell the health care staff about your symptoms: fever, cough and shortness of breath. . After you have been seen by a medical provider, you will be either: o Tested for (COVID-19) and discharged home on quarantine except to seek medical care if symptoms worsen, and asked to  - Stay home and avoid contact with others until you get your results (4-5 days)  - Avoid travel on public transportation if possible (such as bus, train, or airplane) or o Sent to the Emergency Department by EMS for evaluation, COVID-19 testing, and possible  admission depending on your condition and test results.  What to do if you are LOW RISK for COVID-19?  Reduce your risk of any infection by using the same precautions used for avoiding the common cold or flu:  . Wash your hands often with soap and warm water for at least 20 seconds.  If soap and water are not readily available, use an alcohol-based hand sanitizer with at least 60% alcohol.  . If coughing or sneezing, cover your mouth and nose by coughing or sneezing into the elbow areas of your shirt or coat, into a tissue or into your sleeve (not your hands). . Avoid shaking hands with others and consider head nods or verbal greetings only. . Avoid touching your eyes, nose, or mouth with unwashed hands.  . Avoid close contact with people who are sick. . Avoid places or events with large numbers of people in one location, like concerts or sporting events. . Carefully consider travel plans you have or are making. . If you are planning any travel outside or inside the [place], visit the CDC's Travelers' Health webpage for the latest health notices. . If you have some symptoms but not all symptoms, continue to monitor at home and seek medical attention if your symptoms worsen. . If you are having a medical emergency, call 911.   ADDITIONAL HEALTHCARE OPTIONS FOR PATIENTS  [place] [facility] / e-Visit: [email or web address]         [facility]: [phone]  [place]   [facility]: [phone]                   [facility]: [phone]   

## 2018-10-13 DIAGNOSIS — H6063 Unspecified chronic otitis externa, bilateral: Secondary | ICD-10-CM | POA: Diagnosis not present

## 2018-11-12 DIAGNOSIS — F17200 Nicotine dependence, unspecified, uncomplicated: Secondary | ICD-10-CM | POA: Diagnosis not present

## 2018-11-12 DIAGNOSIS — H6063 Unspecified chronic otitis externa, bilateral: Secondary | ICD-10-CM | POA: Diagnosis not present

## 2018-11-26 DIAGNOSIS — E7849 Other hyperlipidemia: Secondary | ICD-10-CM | POA: Diagnosis not present

## 2018-11-26 DIAGNOSIS — Z125 Encounter for screening for malignant neoplasm of prostate: Secondary | ICD-10-CM | POA: Diagnosis not present

## 2018-12-01 DIAGNOSIS — Z23 Encounter for immunization: Secondary | ICD-10-CM | POA: Diagnosis not present

## 2018-12-01 DIAGNOSIS — C3490 Malignant neoplasm of unspecified part of unspecified bronchus or lung: Secondary | ICD-10-CM | POA: Insufficient documentation

## 2018-12-01 DIAGNOSIS — J309 Allergic rhinitis, unspecified: Secondary | ICD-10-CM | POA: Diagnosis not present

## 2018-12-01 DIAGNOSIS — H7291 Unspecified perforation of tympanic membrane, right ear: Secondary | ICD-10-CM | POA: Diagnosis not present

## 2018-12-01 DIAGNOSIS — K4090 Unilateral inguinal hernia, without obstruction or gangrene, not specified as recurrent: Secondary | ICD-10-CM | POA: Diagnosis not present

## 2018-12-01 DIAGNOSIS — R319 Hematuria, unspecified: Secondary | ICD-10-CM | POA: Diagnosis not present

## 2018-12-01 DIAGNOSIS — I10 Essential (primary) hypertension: Secondary | ICD-10-CM | POA: Diagnosis not present

## 2018-12-01 DIAGNOSIS — Z0000 Encounter for general adult medical examination without abnormal findings: Secondary | ICD-10-CM | POA: Diagnosis not present

## 2018-12-01 DIAGNOSIS — I7090 Unspecified atherosclerosis: Secondary | ICD-10-CM | POA: Diagnosis not present

## 2018-12-01 DIAGNOSIS — R82998 Other abnormal findings in urine: Secondary | ICD-10-CM | POA: Diagnosis not present

## 2018-12-01 DIAGNOSIS — H6523 Chronic serous otitis media, bilateral: Secondary | ICD-10-CM | POA: Diagnosis not present

## 2018-12-01 DIAGNOSIS — I2510 Atherosclerotic heart disease of native coronary artery without angina pectoris: Secondary | ICD-10-CM | POA: Diagnosis not present

## 2018-12-01 DIAGNOSIS — J449 Chronic obstructive pulmonary disease, unspecified: Secondary | ICD-10-CM | POA: Diagnosis not present

## 2018-12-01 DIAGNOSIS — Z1331 Encounter for screening for depression: Secondary | ICD-10-CM | POA: Diagnosis not present

## 2018-12-01 DIAGNOSIS — R809 Proteinuria, unspecified: Secondary | ICD-10-CM | POA: Diagnosis not present

## 2018-12-01 DIAGNOSIS — E871 Hypo-osmolality and hyponatremia: Secondary | ICD-10-CM | POA: Diagnosis not present

## 2018-12-04 ENCOUNTER — Telehealth: Payer: Self-pay | Admitting: *Deleted

## 2018-12-04 DIAGNOSIS — Z1212 Encounter for screening for malignant neoplasm of rectum: Secondary | ICD-10-CM | POA: Diagnosis not present

## 2018-12-04 NOTE — Telephone Encounter (Signed)
Called patient to inform of CT for 12-10-18 - arrival time- 11:15 am @ [facility], no restrictions to test, fu on 12-11-18 @ 4:45 pm for results with Dr. Sondra Come, lvm for a return call

## 2018-12-08 ENCOUNTER — Ambulatory Visit: Payer: Medicare Other | Admitting: Radiation Oncology

## 2018-12-10 ENCOUNTER — Other Ambulatory Visit: Payer: Self-pay

## 2018-12-10 ENCOUNTER — Telehealth: Payer: Self-pay | Admitting: *Deleted

## 2018-12-10 ENCOUNTER — Ambulatory Visit (HOSPITAL_COMMUNITY)
Admission: RE | Admit: 2018-12-10 | Discharge: 2018-12-10 | Disposition: A | Discharge status: Home / Self Care | Payer: Medicare Other | Source: Ambulatory Visit | Attending: Radiation Oncology | Admitting: Radiation Oncology

## 2018-12-10 DIAGNOSIS — J438 Other emphysema: Secondary | ICD-10-CM | POA: Diagnosis not present

## 2018-12-10 DIAGNOSIS — C3432 Malignant neoplasm of lower lobe, left bronchus or lung: Secondary | ICD-10-CM | POA: Diagnosis not present

## 2018-12-10 DIAGNOSIS — R918 Other nonspecific abnormal finding of lung field: Secondary | ICD-10-CM | POA: Diagnosis not present

## 2018-12-10 NOTE — Telephone Encounter (Signed)
Called patient to ask about coming in tomorrow @ 1:30 pm, patient agreed to do so.

## 2018-12-11 ENCOUNTER — Other Ambulatory Visit: Payer: Self-pay

## 2018-12-11 ENCOUNTER — Encounter: Payer: Self-pay | Admitting: Radiation Oncology

## 2018-12-11 ENCOUNTER — Ambulatory Visit
Admission: RE | Admit: 2018-12-11 | Discharge: 2018-12-11 | Disposition: A | Discharge status: Home / Self Care | Payer: Medicare Other | Source: Ambulatory Visit | Attending: Radiation Oncology | Admitting: Radiation Oncology

## 2018-12-11 VITALS — BP 124/70 | HR 96 | Temp 98.5°F | Resp 18 | Ht 72.0 in | Wt 155.0 lb

## 2018-12-11 DIAGNOSIS — D3502 Benign neoplasm of left adrenal gland: Secondary | ICD-10-CM | POA: Diagnosis not present

## 2018-12-11 DIAGNOSIS — C3432 Malignant neoplasm of lower lobe, left bronchus or lung: Secondary | ICD-10-CM

## 2018-12-11 DIAGNOSIS — I700 Atherosclerosis of aorta: Secondary | ICD-10-CM | POA: Insufficient documentation

## 2018-12-11 DIAGNOSIS — Z923 Personal history of irradiation: Secondary | ICD-10-CM | POA: Diagnosis not present

## 2018-12-11 DIAGNOSIS — Z79899 Other long term (current) drug therapy: Secondary | ICD-10-CM | POA: Diagnosis not present

## 2018-12-11 DIAGNOSIS — F17210 Nicotine dependence, cigarettes, uncomplicated: Secondary | ICD-10-CM | POA: Diagnosis not present

## 2018-12-11 DIAGNOSIS — J439 Emphysema, unspecified: Secondary | ICD-10-CM | POA: Insufficient documentation

## 2018-12-11 DIAGNOSIS — Z08 Encounter for follow-up examination after completed treatment for malignant neoplasm: Secondary | ICD-10-CM | POA: Diagnosis not present

## 2018-12-11 NOTE — Progress Notes (Signed)
Radiation Oncology         [phone] ________________________________  Name: Dylan Harvey MRN: [id number]  Date: 12/11/2018  DOB: 14-Mar-1951  Follow-Up Visit Note  CC: Crist Infante, MD  Crist Infante, MD    ICD-10-CM   1. 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 Follow up chest CT was performed yesterday, 12/10/2018, and showed stable/ shrinkage of the treated left lower lobe nodule with no evidence of metastatic disease.  On review of systems, he denies shortness of breath, cough, hemoptysis, and any difficulty swallowing. He reports arthritic pain in his left hand, rated a 4/10.  He continues to wean himself off of cigarettes.  He does not wish to try Chantix.              ALLERGIES:  is allergic to neomycin-bacitracin zn-polymyx.  Meds: Current Outpatient Medications  Medication Sig Dispense Refill  . Alfalfa 500 MG TABS Take 2,000-2,500 tablets by mouth See admin instructions. Take 2000 mg by mouth at lunch, 2000 mg at dinner and 2500 mgbefore bed    . b complex vitamins tablet Take 1 tablet by mouth daily.    . Chloramphenicol CRYS Place in ear(s).    . cholecalciferol (VITAMIN D) 1000 units tablet Take 1,000 Units by mouth daily.    . fexofenadine (ALLEGRA) 60 MG tablet Take 60 mg by mouth 2 (two) times daily.    . Flaxseed, Linseed, (RA FLAX SEED OIL 1000 PO) Take 1,000 mg by mouth daily.     Nyoka Cowden Tea, Camellia sinensis, (GREEN TEA PO) Take 500 mg by mouth daily.    . Lecithin 1200 MG CAPS Take 1,200 mg by mouth daily.    Marland Kitchen lisinopril (PRINIVIL,ZESTRIL) 5 MG tablet Take 10 mg by mouth daily.     . Multiple Vitamin (MULTIVITAMIN WITH MINERALS) TABS  Take 1 tablet by mouth daily.    . Omega-3 Fatty Acids (FISH OIL) 1200 MG CAPS Take 1,200 mg by mouth daily.    . potassium gluconate 595 (99 K) MG TABS tablet Take 99 mg by mouth every other day.     . Probiotic Product (PROBIOTIC PO) Take 1 capsule by mouth daily. 20 billion with 4 strains    . PSYLLIUM HUSK PO Take 4 tablets by mouth 3 (three) times daily. 4 tabs lunch, 4 dinner, 4 one hour before bed     . Selenium 200 MCG CAPS Take 200 mcg by mouth 2 (two) times a week.     . sodium chloride (OCEAN) 0.65 % SOLN nasal spray Place 1 spray into the nose daily.    . Turmeric 500 MG TABS Take 500 mg by mouth every other day.    . vitamin C (ASCORBIC ACID) 500 MG tablet Take 500 mg by mouth daily.    Marland Kitchen zinc gluconate 50 MG tablet Take 50 mg by mouth daily.     No current facility-administered medications for this encounter.     Physical Findings: The patient is in no acute distress. Patient is alert and oriented.  height is 6' (1.829 m) and weight is 155 lb (70.3 kg). His temporal temperature is 98.5  F (36.9 C). His blood pressure is 124/70 and his pulse is 96. His respiration is 18 and oxygen saturation is 100%. .  No significant changes. Lungs are clear to auscultation bilaterally. Heart has regular rate and rhythm. No palpable cervical, supraclavicular, or axillary adenopathy. Abdomen soft, non-tender, normal bowel sounds.   Lab Findings: Lab Results  Component Value Date   WBC 8.1 06/04/2018   HGB 13.6 06/04/2018   HCT 39.4 06/04/2018   MCV 95.2 06/04/2018   PLT 190 06/04/2018    Radiographic Findings: Ct Chest Wo Contrast  Result Date: 12/10/2018 CLINICAL DATA:  Lung cancer with locoregional recurrence, post therapy, assess treatment response. EXAM: CT CHEST WITHOUT CONTRAST TECHNIQUE: Multidetector CT imaging of the chest was performed following the standard protocol without IV contrast. COMPARISON:  PET 05/26/2018 and CT chest 05/07/2018. FINDINGS: Cardiovascular:  Atherosclerotic calcification of the aorta, aortic valve and coronary arteries. Heart size normal. No pericardial effusion. Mediastinum/Nodes: No pathologically enlarged mediastinal or axillary lymph nodes. Hilar regions are difficult to evaluate without IV contrast. Esophagus is unremarkable. Lungs/Pleura: Centrilobular and paraseptal emphysema. 8 x 9 mm left lower lobe nodule (8/83) is stable from 05/07/2018. Additional small pulmonary nodules in the left lower lobe measure up to 5 mm, stable. No pleural fluid. Debris is seen dependently in the trachea. Airway is otherwise unremarkable. Upper Abdomen: Visualized portions of the liver, gallbladder and right adrenal gland are unremarkable. 1.4 cm nodule in the medial limb left adrenal gland measures 22 Hounsfield units and was not hypermetabolic on comparison PET. Visualized portions of the kidneys, spleen, pancreas, stomach and bowel are grossly unremarkable. No upper abdominal adenopathy. Musculoskeletal: Degenerative changes in the spine. No worrisome lytic or sclerotic lesions. Old rib fracture. IMPRESSION: 1. Left lower lobe nodule is stable. No evidence of metastatic disease. 2. Left adrenal adenoma. 3. Aortic atherosclerosis (ICD10-170.0). Coronary artery calcification. 4.  Emphysema (ICD10-J43.9). Electronically Signed   By: Lorin Picket M.D.   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  Favorable response to SBRT on recent chest CT scan.  No new problems  Plan:  Pt will be scheduled for a chest CT scan in 6 months and then a follow-up soon afterward.   ____________________________________   Blair Promise, PhD, MD   This document serves as a record of services personally performed by Gery Pray, MD. It was created on his behalf by Wilburn Mylar, a trained medical scribe. The creation of this record is based on the scribe's personal  observations and the provider's statements to them. This document has been checked and approved by the attending provider.

## 2018-12-11 NOTE — Patient Instructions (Signed)
Coronavirus (COVID-19) Are you at risk?  Are you at risk for the Coronavirus (COVID-19)?  To be considered HIGH RISK for Coronavirus (COVID-19), you have to meet the following criteria:  . Traveled to [place], [place], [place], [place] or [place]; or in the [place] to [place], [place], [place], or [place]; and have fever, cough, and shortness of breath within the last 2 weeks of travel OR . Been in close contact with a person diagnosed with COVID-19 within the last 2 weeks and have fever, cough, and shortness of breath . IF YOU DO NOT MEET THESE CRITERIA, YOU ARE CONSIDERED LOW RISK FOR COVID-19.  What to do if you are HIGH RISK for COVID-19?  . If you are having a medical emergency, call 911. . Seek medical care right away. Before you go to a doctor's office, urgent care or emergency department, call ahead and tell them about your recent travel, contact with someone diagnosed with COVID-19, and your symptoms. You should receive instructions from your physician's office regarding next steps of care.  . When you arrive at healthcare provider, tell the healthcare staff immediately you have returned from visiting [place], [place], [place], [place] or [place]; or traveled in the [place] to [place], [place], [place], or [place]; in the last two weeks or you have been in close contact with a person diagnosed with COVID-19 in the last 2 weeks.   . Tell the health care staff about your symptoms: fever, cough and shortness of breath. . After you have been seen by a medical provider, you will be either: o Tested for (COVID-19) and discharged home on quarantine except to seek medical care if symptoms worsen, and asked to  - Stay home and avoid contact with others until you get your results (4-5 days)  - Avoid travel on public transportation if possible (such as bus, train, or airplane) or o Sent to the Emergency Department by EMS for evaluation, COVID-19 testing, and possible  admission depending on your condition and test results.  What to do if you are LOW RISK for COVID-19?  Reduce your risk of any infection by using the same precautions used for avoiding the common cold or flu:  . Wash your hands often with soap and warm water for at least 20 seconds.  If soap and water are not readily available, use an alcohol-based hand sanitizer with at least 60% alcohol.  . If coughing or sneezing, cover your mouth and nose by coughing or sneezing into the elbow areas of your shirt or coat, into a tissue or into your sleeve (not your hands). . Avoid shaking hands with others and consider head nods or verbal greetings only. . Avoid touching your eyes, nose, or mouth with unwashed hands.  . Avoid close contact with people who are sick. . Avoid places or events with large numbers of people in one location, like concerts or sporting events. . Carefully consider travel plans you have or are making. . If you are planning any travel outside or inside the [place], visit the CDC's Travelers' Health webpage for the latest health notices. . If you have some symptoms but not all symptoms, continue to monitor at home and seek medical attention if your symptoms worsen. . If you are having a medical emergency, call 911.   ADDITIONAL HEALTHCARE OPTIONS FOR PATIENTS  [place] [facility] / e-Visit: [email or web address]         [facility]: [phone]  [place]   [facility]: [phone]                   [facility]: [phone]   

## 2018-12-11 NOTE — Progress Notes (Signed)
Pt presents today for f/u with Dr. Sondra Come. Pt reports arthritis pain in LEFT hand, rated 4/10. Pt denies SOB. Pt denies cough, hemoptysis. Pt denies difficulty swallowing.   BP 124/70 (BP Location: Right Arm, Patient Position: Sitting)   Pulse 96   Temp 98.5 F (36.9 C) (Temporal)   Resp 18   Ht 6' (1.829 m)   Wt 155 lb (70.3 kg)   SpO2 100%   BMI 21.02 kg/m   Wt Readings from Last 3 Encounters:  12/11/18 155 lb (70.3 kg)  09/04/18 154 lb 9.6 oz (70.1 kg)  07/02/18 156 lb 6.4 oz (70.9 kg)   Loma Sousa, RN BSN

## 2018-12-17 DIAGNOSIS — H60311 Diffuse otitis externa, right ear: Secondary | ICD-10-CM | POA: Diagnosis not present

## 2018-12-17 DIAGNOSIS — F17200 Nicotine dependence, unspecified, uncomplicated: Secondary | ICD-10-CM | POA: Diagnosis not present

## 2018-12-29 DIAGNOSIS — H6063 Unspecified chronic otitis externa, bilateral: Secondary | ICD-10-CM | POA: Diagnosis not present

## 2019-01-02 DIAGNOSIS — R361 Hematospermia: Secondary | ICD-10-CM | POA: Diagnosis not present

## 2019-01-02 DIAGNOSIS — N50811 Right testicular pain: Secondary | ICD-10-CM | POA: Diagnosis not present

## 2019-01-02 DIAGNOSIS — N50812 Left testicular pain: Secondary | ICD-10-CM | POA: Diagnosis not present

## 2019-04-15 DIAGNOSIS — M2391 Unspecified internal derangement of right knee: Secondary | ICD-10-CM | POA: Diagnosis not present

## 2019-04-15 DIAGNOSIS — M25561 Pain in right knee: Secondary | ICD-10-CM | POA: Diagnosis not present

## 2019-05-06 DIAGNOSIS — M25561 Pain in right knee: Secondary | ICD-10-CM | POA: Diagnosis not present

## 2019-05-13 DIAGNOSIS — M25561 Pain in right knee: Secondary | ICD-10-CM | POA: Diagnosis not present

## 2019-05-13 DIAGNOSIS — M2241 Chondromalacia patellae, right knee: Secondary | ICD-10-CM | POA: Diagnosis not present

## 2019-05-14 DIAGNOSIS — H6122 Impacted cerumen, left ear: Secondary | ICD-10-CM | POA: Diagnosis not present

## 2019-05-14 DIAGNOSIS — H60391 Other infective otitis externa, right ear: Secondary | ICD-10-CM | POA: Diagnosis not present

## 2019-05-14 DIAGNOSIS — F17200 Nicotine dependence, unspecified, uncomplicated: Secondary | ICD-10-CM | POA: Diagnosis not present

## 2019-05-15 ENCOUNTER — Ambulatory Visit: Payer: Medicare Other | Attending: Internal Medicine

## 2019-05-15 DIAGNOSIS — Z23 Encounter for immunization: Secondary | ICD-10-CM | POA: Insufficient documentation

## 2019-05-15 NOTE — Progress Notes (Signed)
   Covid-19 Vaccination Clinic  Name:  Dylan Harvey    MRN: [id number] DOB: 14-Sep-1950  05/15/2019  Mr. Harriott was observed post Covid-19 immunization for 15 minutes without incidence. He was provided with Vaccine Information Sheet and instruction to access the V-Safe system.   Mr. Mimbs was instructed to call 911 with any severe reactions post vaccine: Marland Kitchen Difficulty breathing  . Swelling of your face and throat  . A fast heartbeat  . A bad rash all over your body  . Dizziness and weakness    Immunizations Administered    Name Date Dose VIS Date Route   Pfizer COVID-19 Vaccine 05/15/2019  2:38 PM 0.3 mL 02/27/2019 Intramuscular   Manufacturer: [place]   Lot: [id number]   [place]: [id number]

## 2019-05-20 DIAGNOSIS — H60331 Swimmer's ear, right ear: Secondary | ICD-10-CM | POA: Diagnosis not present

## 2019-05-20 DIAGNOSIS — Z8669 Personal history of other diseases of the nervous system and sense organs: Secondary | ICD-10-CM | POA: Diagnosis not present

## 2019-05-25 DIAGNOSIS — H25043 Posterior subcapsular polar age-related cataract, bilateral: Secondary | ICD-10-CM | POA: Diagnosis not present

## 2019-05-25 DIAGNOSIS — H2513 Age-related nuclear cataract, bilateral: Secondary | ICD-10-CM | POA: Diagnosis not present

## 2019-05-25 DIAGNOSIS — H524 Presbyopia: Secondary | ICD-10-CM | POA: Diagnosis not present

## 2019-05-25 DIAGNOSIS — H43813 Vitreous degeneration, bilateral: Secondary | ICD-10-CM | POA: Diagnosis not present

## 2019-06-10 ENCOUNTER — Ambulatory Visit: Payer: Medicare Other | Attending: Internal Medicine

## 2019-06-10 DIAGNOSIS — Z23 Encounter for immunization: Secondary | ICD-10-CM

## 2019-06-10 NOTE — Progress Notes (Signed)
   Covid-19 Vaccination Clinic  Name:  Dylan Harvey    MRN: [id number] DOB: [birth year]  06/10/2019  [name] was observed post Covid-19 immunization for 15 minutes without incident. He was provided with Vaccine Information Sheet and instruction to access the V-Safe system.   [name] was instructed to call 911 with any severe reactions post vaccine: Marland Kitchen Difficulty breathing  . Swelling of face and throat  . A fast heartbeat  . A bad rash all over body  . Dizziness and weakness   Immunizations Administered    Name Date Dose VIS Date Route   Pfizer COVID-19 Vaccine 06/10/2019  2:29 PM 0.3 mL 02/27/2019 Intramuscular   Manufacturer: [place]   Lot: [id number]   [place]: [id number]

## 2019-06-15 ENCOUNTER — Ambulatory Visit: Payer: Medicare Other | Admitting: Radiation Oncology

## 2019-06-15 ENCOUNTER — Ambulatory Visit
Admission: RE | Admit: 2019-06-15 | Discharge: 2019-06-15 | Disposition: A | Discharge status: Home / Self Care | Payer: Medicare Other | Source: Ambulatory Visit | Attending: Radiation Oncology | Admitting: Radiation Oncology

## 2019-06-15 ENCOUNTER — Encounter: Payer: Self-pay | Admitting: Radiation Oncology

## 2019-06-15 ENCOUNTER — Ambulatory Visit (HOSPITAL_COMMUNITY)
Admission: RE | Admit: 2019-06-15 | Discharge: 2019-06-15 | Disposition: A | Discharge status: Home / Self Care | Payer: Medicare Other | Source: Ambulatory Visit | Attending: Radiation Oncology | Admitting: Radiation Oncology

## 2019-06-15 ENCOUNTER — Other Ambulatory Visit: Payer: Self-pay

## 2019-06-15 VITALS — BP 126/71 | HR 83 | Temp 98.0°F | Resp 18 | Ht 72.0 in | Wt 159.2 lb

## 2019-06-15 DIAGNOSIS — C3490 Malignant neoplasm of unspecified part of unspecified bronchus or lung: Secondary | ICD-10-CM | POA: Diagnosis not present

## 2019-06-15 DIAGNOSIS — C3432 Malignant neoplasm of lower lobe, left bronchus or lung: Secondary | ICD-10-CM

## 2019-06-15 DIAGNOSIS — Z08 Encounter for follow-up examination after completed treatment for malignant neoplasm: Secondary | ICD-10-CM | POA: Diagnosis not present

## 2019-06-15 NOTE — Progress Notes (Signed)
Patient in for follow up after ct scan. No complaints of

## 2019-06-15 NOTE — Patient Instructions (Signed)
Coronavirus (COVID-19) Are you at risk?  Are you at risk for the Coronavirus (COVID-19)?  To be considered HIGH RISK for Coronavirus (COVID-19), you have to meet the following criteria:  . Traveled to [place], [place], [place], [place] or [place]; or in the [place] to [place], [place], [place], or [place]; and have fever, cough, and shortness of breath within the last 2 weeks of travel OR . Been in close contact with a person diagnosed with COVID-19 within the last 2 weeks and have fever, cough, and shortness of breath . IF YOU DO NOT MEET THESE CRITERIA, YOU ARE CONSIDERED LOW RISK FOR COVID-19.  What to do if you are HIGH RISK for COVID-19?  . If you are having a medical emergency, call 911. . Seek medical care right away. Before you go to a doctor's office, urgent care or emergency department, call ahead and tell them about your recent travel, contact with someone diagnosed with COVID-19, and your symptoms. You should receive instructions from your physician's office regarding next steps of care.  . When you arrive at healthcare provider, tell the healthcare staff immediately you have returned from visiting [place], [place], [place], [place] or [place]; or traveled in the [place] to [place], [place], [place], or [place]; in the last two weeks or you have been in close contact with a person diagnosed with COVID-19 in the last 2 weeks.   . Tell the health care staff about your symptoms: fever, cough and shortness of breath. . After you have been seen by a medical provider, you will be either: o Tested for (COVID-19) and discharged home on quarantine except to seek medical care if symptoms worsen, and asked to  - Stay home and avoid contact with others until you get your results (4-5 days)  - Avoid travel on public transportation if possible (such as bus, train, or airplane) or o Sent to the Emergency Department by EMS for evaluation, COVID-19 testing, and possible  admission depending on your condition and test results.  What to do if you are LOW RISK for COVID-19?  Reduce your risk of any infection by using the same precautions used for avoiding the common cold or flu:  . Wash your hands often with soap and warm water for at least 20 seconds.  If soap and water are not readily available, use an alcohol-based hand sanitizer with at least 60% alcohol.  . If coughing or sneezing, cover your mouth and nose by coughing or sneezing into the elbow areas of your shirt or coat, into a tissue or into your sleeve (not your hands). . Avoid shaking hands with others and consider head nods or verbal greetings only. . Avoid touching your eyes, nose, or mouth with unwashed hands.  . Avoid close contact with people who are sick. . Avoid places or events with large numbers of people in one location, like concerts or sporting events. . Carefully consider travel plans you have or are making. . If you are planning any travel outside or inside the [place], visit the CDC's Travelers' Health webpage for the latest health notices. . If you have some symptoms but not all symptoms, continue to monitor at home and seek medical attention if your symptoms worsen. . If you are having a medical emergency, call 911.   ADDITIONAL HEALTHCARE OPTIONS FOR PATIENTS  [place] [facility] / e-Visit: [email or web address]         [facility]: [phone]  [place]   [facility]: [phone]                   [facility]: [phone]   

## 2019-06-15 NOTE — Progress Notes (Signed)
Radiation Oncology         [phone] ________________________________  Name: Dylan Harvey MRN: [id number]  Date: 06/15/2019  DOB: 04-21-1950  Follow-Up Visit Note  CC: Crist Infante, MD  Crist Infante, MD    ICD-10-CM   1. 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  07/24/2018, 07/28/2018, 07/31/2018: LLL / 54 Gy in 3 fractions (SBRT)  Narrative:  The patient returns today for routine follow-up. CT scan of chest performed today showed evolving changes of radiation therapy in the left lower lobe with slight decrease in size of the primary left lower lobe nodule. There was no evidence of metastatic disease. Additional left lower lobe nodule were stable.  Of note, the patient received his COVID vaccinations on 05/15/2019 and 06/10/2019.3  On review of systems, he reports feeling well.Marland Kitchen He denies breathing issues pain within the chest cough or hemoptysis.  ALLERGIES:  is allergic to neomycin-bacitracin zn-polymyx.  Meds: Current Outpatient Medications  Medication Sig Dispense Refill  . Alfalfa 500 MG TABS Take 2,000-2,500 tablets by mouth See admin instructions. Take 2000 mg by mouth at lunch, 2000 mg at dinner and 2500 mgbefore bed    . b complex vitamins tablet Take 1 tablet by mouth daily.    . cholecalciferol (VITAMIN D) 1000 units tablet Take 1,000 Units by mouth daily.    . fexofenadine (ALLEGRA) 60 MG tablet Take 60 mg by mouth 2 (two) times daily.    . Flaxseed, Linseed, (RA FLAX SEED OIL 1000 PO) Take 1,000 mg by mouth daily.     Nyoka Cowden Tea, Camellia sinensis, (GREEN TEA PO) Take 500 mg by mouth daily.    . Lecithin 1200 MG CAPS Take 1,200 mg by mouth daily.    Marland Kitchen lisinopril (ZESTRIL) 10 MG tablet Take 10 mg by mouth daily.    . Multiple Vitamin (MULTIVITAMIN WITH  MINERALS) TABS Take 1 tablet by mouth daily.    . Omega-3 Fatty Acids (FISH OIL) 1200 MG CAPS Take 1,200 mg by mouth daily.    . potassium gluconate 595 (99 K) MG TABS tablet Take 99 mg by mouth every other day.     . Probiotic Product (PROBIOTIC PO) Take 1 capsule by mouth daily. 20 billion with 4 strains    . PSYLLIUM HUSK PO Take 4 tablets by mouth 3 (three) times daily. 4 tabs lunch, 4 dinner, 4 one hour before bed     . Selenium 200 MCG CAPS Take 200 mcg by mouth 2 (two) times a week.     . sodium chloride (OCEAN) 0.65 % SOLN nasal spray Place 1 spray into the nose daily.    . Turmeric 500 MG TABS Take 500 mg by mouth every other day.    . vitamin C (ASCORBIC ACID) 500 MG tablet Take 500 mg by mouth daily.    Marland Kitchen zinc gluconate 50 MG tablet Take 50 mg by mouth daily.     No current facility-administered medications for this encounter.    Physical Findings: The patient is in no acute distress. Patient is alert and oriented.  height is 6' (1.829 m) and weight is 159 lb 4 oz (72.2 kg). His temporal temperature is 98 F (36.7 C). His blood pressure is 126/71 and his pulse is 83. His respiration  is 18 and oxygen saturation is 100%.   No significant changes. Lungs are clear to auscultation bilaterally. Heart has regular rate and rhythm. No palpable cervical, supraclavicular, or axillary adenopathy. Abdomen soft, non-tender, normal bowel sounds.    Lab Findings: Lab Results  Component Value Date   WBC 8.1 06/04/2018   HGB 13.6 06/04/2018   HCT 39.4 06/04/2018   MCV 95.2 06/04/2018   PLT 190 06/04/2018    Radiographic Findings: CT Chest Wo Contrast  Result Date: 06/15/2019 CLINICAL DATA:  Non-small cell lung cancer with local recurrence. Assess treatment response. EXAM: CT CHEST WITHOUT CONTRAST TECHNIQUE: Multidetector CT imaging of the chest was performed following the standard protocol without IV contrast. COMPARISON:  12/10/2018. FINDINGS: Cardiovascular: Atherosclerotic calcification  of the aorta, aortic valve and coronary arteries. Heart size normal. No pericardial effusion. Mediastinum/Nodes: No pathologically enlarged mediastinal or axillary lymph nodes. Right hilar lymph node measures 9 mm. Hilar regions are otherwise difficult to definitively characterize without IV contrast. Esophagus is unremarkable. Lungs/Pleura: Centrilobular and paraseptal emphysema. Ground-glass and mild architectural distortion in the left lower lobe, indicative of radiation therapy. 5 mm left lower lobe nodule (5/93), decreased in size from 9 mm. Fiducial markers are seen in the adjacent left lower lobe. 4 mm nodule along the left major fissure (5/112) and left lower lobe nodules measuring up to 7 mm (5/135) are unchanged. Subpleural scarring in the posteromedial aspects of both lower lobes. Airway is unremarkable. Upper Abdomen: Visualized portions of the liver, gallbladder and right adrenal gland are unremarkable. Nodular thickening of the left adrenal gland, as before. Kidneys, spleen, pancreas, stomach and bowel are grossly unremarkable. No upper abdominal adenopathy. Musculoskeletal: Degenerative changes in the spine. No worrisome lytic or sclerotic lesions. Old rib fractures. IMPRESSION: 1. Evolving changes of radiation therapy in the left lower lobe with slight decrease in size of the primary left lower lobe nodule. No evidence of metastatic disease. 2. Additional left lower lobe nodules are stable. 3. Aortic atherosclerosis (ICD10-I70.0). Coronary artery calcification. 4.  Emphysema (ICD10-J43.9). Electronically Signed   By: Lorin Picket M.D.   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    Favorable response to SBRT on recent chest CT scan. No new problems.  Plan: The patient will be scheduled will follow-up with radiation oncology in six months.  ___________________________________   Blair Promise,  PhD, MD  This document serves as a record of services personally performed by Gery Pray, MD. It was created on his behalf by Clerance Lav, a trained medical scribe. The creation of this record is based on the scribe's personal observations and the provider's statements to them. This document has been checked and approved by the attending provider.

## 2019-06-18 ENCOUNTER — Ambulatory Visit: Payer: Medicare Other | Admitting: Radiation Oncology

## 2019-06-24 DIAGNOSIS — H6063 Unspecified chronic otitis externa, bilateral: Secondary | ICD-10-CM | POA: Diagnosis not present

## 2019-07-17 DIAGNOSIS — M25561 Pain in right knee: Secondary | ICD-10-CM | POA: Diagnosis not present

## 2019-08-25 DIAGNOSIS — H60331 Swimmer's ear, right ear: Secondary | ICD-10-CM | POA: Diagnosis not present

## 2019-08-25 DIAGNOSIS — H6122 Impacted cerumen, left ear: Secondary | ICD-10-CM | POA: Diagnosis not present

## 2019-10-29 DIAGNOSIS — M25561 Pain in right knee: Secondary | ICD-10-CM | POA: Diagnosis not present

## 2019-10-29 DIAGNOSIS — M2391 Unspecified internal derangement of right knee: Secondary | ICD-10-CM | POA: Diagnosis not present

## 2019-11-02 DIAGNOSIS — H6122 Impacted cerumen, left ear: Secondary | ICD-10-CM | POA: Diagnosis not present

## 2019-12-16 ENCOUNTER — Other Ambulatory Visit: Payer: Self-pay

## 2019-12-16 ENCOUNTER — Ambulatory Visit (HOSPITAL_COMMUNITY)
Admission: RE | Admit: 2019-12-16 | Discharge: 2019-12-16 | Disposition: A | Discharge status: Home / Self Care | Payer: Medicare Other | Source: Ambulatory Visit | Attending: Radiation Oncology | Admitting: Radiation Oncology

## 2019-12-16 ENCOUNTER — Encounter (HOSPITAL_COMMUNITY): Payer: Self-pay

## 2019-12-16 DIAGNOSIS — C3490 Malignant neoplasm of unspecified part of unspecified bronchus or lung: Secondary | ICD-10-CM | POA: Diagnosis not present

## 2019-12-16 DIAGNOSIS — I2510 Atherosclerotic heart disease of native coronary artery without angina pectoris: Secondary | ICD-10-CM | POA: Diagnosis not present

## 2019-12-16 DIAGNOSIS — C3432 Malignant neoplasm of lower lobe, left bronchus or lung: Secondary | ICD-10-CM | POA: Insufficient documentation

## 2019-12-16 DIAGNOSIS — I700 Atherosclerosis of aorta: Secondary | ICD-10-CM | POA: Diagnosis not present

## 2019-12-16 DIAGNOSIS — J8410 Pulmonary fibrosis, unspecified: Secondary | ICD-10-CM | POA: Diagnosis not present

## 2019-12-16 NOTE — Progress Notes (Signed)
Radiation Oncology         [phone] ________________________________  Name: Dylan Harvey MRN: [id number]  Date: 12/17/2019  DOB: 1950/06/11  Follow-Up Visit Note  CC: Crist Infante, MD  Crist Infante, MD    ICD-10-CM   1. 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 He underwent a chest CT scan yesterday, 12/16/2019, that showed a new spiculated nodule of the posterior left upper lobe abutting the fissure that measured approximately 1.6 x 1.4 cm. It was significantly more nodular than the expected appearance of developing radiation fibrosis and was concerning for recurrent/metachronous malignancy. The previously treated primary nodule of the superior segment of the left lower lobe was not significantly changed and measured no greater than 5 mm. Additionally, there were stable scattered small nodules in the left lower lobe that measured up to 8 mm, presumably benign incidental sequelae of prior infection or inflammation.  On review of systems, he reports knee pain secondary to arthritis. He denies any other pain.  He denies any changes in his breathing.  He denies any significant cough or hemoptysis.  He denies any new bony pain headaches or visual problems.  No new medical issues since last follow-up.  No new medications except topical nonsteroidal cream  ALLERGIES:  is allergic to neomycin-bacitracin zn-polymyx.  Meds: Current Outpatient Medications  Medication Sig Dispense Refill  . Alfalfa 500 MG TABS Take 2,000-2,500 tablets by mouth See admin instructions. Take 2000 mg by mouth at lunch, 2000 mg  at dinner and 2500 mgbefore bed    . b complex vitamins tablet Take 1 tablet by mouth daily.    . cholecalciferol (VITAMIN D) 1000 units tablet Take 1,000 Units by mouth daily.    . fexofenadine (ALLEGRA) 60 MG tablet Take 60 mg by mouth 2 (two) times daily.    . Flaxseed, Linseed, (RA FLAX SEED OIL 1000 PO) Take 1,000 mg by mouth daily.     Nyoka Cowden Tea, Camellia sinensis, (GREEN TEA PO) Take 500 mg by mouth daily.    . Lecithin 1200 MG CAPS Take 1,200 mg by mouth daily.    Marland Kitchen lisinopril (ZESTRIL) 10 MG tablet Take 10 mg by mouth daily.    . Multiple Vitamin (MULTIVITAMIN WITH MINERALS) TABS Take 1 tablet by mouth daily.    . Omega-3 Fatty Acids (FISH OIL) 1200 MG CAPS Take 1,200 mg by mouth daily.    . Probiotic Product (PROBIOTIC PO) Take 1 capsule by mouth daily. 20 billion with 4 strains    . PSYLLIUM HUSK PO Take 4 tablets by mouth 3 (three) times daily. 4 tabs lunch, 4 dinner, 4 one hour before bed     . sodium chloride (OCEAN) 0.65 % SOLN nasal spray Place 1 spray into the nose daily.    . Turmeric 500 MG TABS Take 500 mg by mouth every other day.    . vitamin C (ASCORBIC ACID) 500 MG tablet Take 500 mg by mouth daily.    Marland Kitchen zinc gluconate 50 MG tablet Take 50 mg by mouth daily.     No current facility-administered medications  for this encounter.    Physical Findings: The patient is in no acute distress. Patient is alert and oriented.  height is 6' (1.829 m) and weight is 155 lb 9.6 oz (70.6 kg). His temperature is 98.2 F (36.8 C). His blood pressure is 124/75 and his pulse is 86. His respiration is 18 and oxygen saturation is 100%.    Lungs are clear to auscultation bilaterally. Heart has regular rate and rhythm. No palpable cervical, supraclavicular, or axillary adenopathy. Abdomen soft, non-tender, normal bowel sounds.    Lab Findings: Lab Results  Component Value Date   WBC 8.1 06/04/2018   HGB 13.6 06/04/2018   HCT 39.4 06/04/2018   MCV 95.2 06/04/2018   PLT 190 06/04/2018     Radiographic Findings: CT Chest Wo Contrast  Result Date: 12/17/2019 CLINICAL DATA:  Non-small cell lung cancer, assess treatment response EXAM: CT CHEST WITHOUT CONTRAST TECHNIQUE: Multidetector CT imaging of the chest was performed following the standard protocol without IV contrast. COMPARISON:  06/15/2019, 12/10/2018 FINDINGS: Cardiovascular: Scattered aortic atherosclerosis. Normal heart size. Left coronary artery calcifications. No pericardial effusion. Mediastinum/Nodes: No enlarged mediastinal, hilar, or axillary lymph nodes. Thyroid gland, trachea, and esophagus demonstrate no significant findings. Lungs/Pleura: Moderate centrilobular emphysema. Redemonstrated subtle, bandlike post radiation scarring of the left midlung. There is a new spiculated nodule of the posterior left upper lobe abutting the fissure measuring approximately 1.6 x 1.4 cm (series 7, image 90). Previously treated primary nodule of the superior segment left lower lobe is not significantly changed, measuring no greater than 5 mm (series 7, image 94). There are additional stable, scattered small nodules in the left lower lobe measuring up to 8 mm (series 7, image 134). No pleural effusion or pneumothorax. Upper Abdomen: No acute abnormality. Musculoskeletal: No chest wall mass or suspicious bone lesions identified. IMPRESSION: 1. There is a new spiculated nodule of the posterior left upper lobe abutting the fissure measuring approximately 1.6 x 1.4 cm. This is significantly more nodular the expected appearance of developing radiation fibrosis and concerning for recurrent/metachronous malignancy. 2. Previously treated primary nodule of the superior segment left lower lobe is not significantly changed, measuring no greater than 5 mm. 3. There are additional stable, scattered small nodules in the left lower lobe measuring up to 8 mm, presumably benign incidental sequelae of prior infection or inflammation. Attention on follow-up. 4.  Emphysema (ICD10-J43.9). 5. Coronary artery disease. Aortic Atherosclerosis (ICD10-I70.0). Electronically Signed   By: Eddie Candle M.D.   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    Recent chest CT scan showed a new spiculated nodule of the posterior left upper lobe abutting the fissure that measured approximately 1.6 x 1.4 cm. It was significantly more nodular than the expected appearance of developing radiation fibrosis and was concerning for recurrent/metachronous malignancy. The previously treated primary nodule of the superior segment of the left lower lobe was not significantly changed and measured no greater than 5 mm but significantly smaller than prior to treatment.  I reviewed the patient's chest CT scan with the patient in detail.  I also reviewed the patient's previous radiation (SBRT).  This area of concern is in a separate location from his previous treatment.  This does seem somewhat concerning on chest CT scan and have recommended we proceed with a PET scan for further evaluation.  Plan: A PET scan has been ordered.  The patient will be seen soon  after this study for further evaluation.  Total time spent in this encounter was 25 minutes which included reviewing the patient's most recent chest CT scan, physical examination, and documentation. ___________________________________   Blair Promise, PhD, MD  This document serves as a record of services personally performed by Gery Pray, MD. It was created on his behalf by Clerance Lav, a trained medical scribe. The creation of this record is based on the scribe's personal observations and the provider's statements to them. This document has been checked and approved by the attending provider.

## 2019-12-17 ENCOUNTER — Encounter: Payer: Self-pay | Admitting: Radiation Oncology

## 2019-12-17 ENCOUNTER — Other Ambulatory Visit: Payer: Self-pay

## 2019-12-17 ENCOUNTER — Ambulatory Visit
Admission: RE | Admit: 2019-12-17 | Discharge: 2019-12-17 | Disposition: A | Discharge status: Home / Self Care | Payer: Medicare Other | Source: Ambulatory Visit | Attending: Radiation Oncology | Admitting: Radiation Oncology

## 2019-12-17 ENCOUNTER — Telehealth: Payer: Self-pay | Admitting: *Deleted

## 2019-12-17 VITALS — BP 124/75 | HR 86 | Temp 98.2°F | Resp 18 | Ht 72.0 in | Wt 155.6 lb

## 2019-12-17 DIAGNOSIS — C3432 Malignant neoplasm of lower lobe, left bronchus or lung: Secondary | ICD-10-CM | POA: Insufficient documentation

## 2019-12-17 DIAGNOSIS — Z85118 Personal history of other malignant neoplasm of bronchus and lung: Secondary | ICD-10-CM | POA: Diagnosis not present

## 2019-12-17 DIAGNOSIS — J439 Emphysema, unspecified: Secondary | ICD-10-CM | POA: Diagnosis not present

## 2019-12-17 DIAGNOSIS — Z923 Personal history of irradiation: Secondary | ICD-10-CM | POA: Insufficient documentation

## 2019-12-17 DIAGNOSIS — M1990 Unspecified osteoarthritis, unspecified site: Secondary | ICD-10-CM | POA: Insufficient documentation

## 2019-12-17 DIAGNOSIS — R918 Other nonspecific abnormal finding of lung field: Secondary | ICD-10-CM | POA: Diagnosis not present

## 2019-12-17 DIAGNOSIS — I2510 Atherosclerotic heart disease of native coronary artery without angina pectoris: Secondary | ICD-10-CM | POA: Diagnosis not present

## 2019-12-17 DIAGNOSIS — I700 Atherosclerosis of aorta: Secondary | ICD-10-CM | POA: Insufficient documentation

## 2019-12-17 DIAGNOSIS — M25569 Pain in unspecified knee: Secondary | ICD-10-CM | POA: Insufficient documentation

## 2019-12-17 DIAGNOSIS — Z08 Encounter for follow-up examination after completed treatment for malignant neoplasm: Secondary | ICD-10-CM | POA: Diagnosis not present

## 2019-12-17 NOTE — Progress Notes (Signed)
Patient in for follow up after his CT scan he denies any pain except for arthritis in his knee. Distress tool is 0. VS are stable. Has had his Covid vaccine.

## 2019-12-17 NOTE — Telephone Encounter (Signed)
CALLED PATIENT TO INFORM OF PET SCAN FOR 12-28-19 - ARRIVAL TIME- 11:30 AM @ [facility], PT. TO BE NPO- 6 HRS. PRIOR TO TEST, PATIENT TO AVOID CARBS LAST MEAL BEFORE TEST, PATIENT TO ABSTAIN FROM CANDY AND GUM, PATIENT TO RECEIVE RESULTS FROM DR. KINARD ON 12-31-19 @ 10:45 AM, LVM FOR A RETURN CALL

## 2019-12-17 NOTE — Patient Instructions (Signed)
Coronavirus (COVID-19) Are you at risk?  Are you at risk for the Coronavirus (COVID-19)?  To be considered HIGH RISK for Coronavirus (COVID-19), you have to meet the following criteria:  . Traveled to [place], [place], [place], [place] or [place]; or in the [place] to [place], [place], [place], or [place]; and have fever, cough, and shortness of breath within the last 2 weeks of travel OR . Been in close contact with a person diagnosed with COVID-19 within the last 2 weeks and have fever, cough, and shortness of breath . IF YOU DO NOT MEET THESE CRITERIA, YOU ARE CONSIDERED LOW RISK FOR COVID-19.  What to do if you are HIGH RISK for COVID-19?  . If you are having a medical emergency, call 911. . Seek medical care right away. Before you go to a doctor's office, urgent care or emergency department, call ahead and tell them about your recent travel, contact with someone diagnosed with COVID-19, and your symptoms. You should receive instructions from your physician's office regarding next steps of care.  . When you arrive at healthcare provider, tell the healthcare staff immediately you have returned from visiting [place], [place], [place], [place] or [place]; or traveled in the [place] to [place], [place], [place], or [place]; in the last two weeks or you have been in close contact with a person diagnosed with COVID-19 in the last 2 weeks.   . Tell the health care staff about your symptoms: fever, cough and shortness of breath. . After you have been seen by a medical provider, you will be either: o Tested for (COVID-19) and discharged home on quarantine except to seek medical care if symptoms worsen, and asked to  - Stay home and avoid contact with others until you get your results (4-5 days)  - Avoid travel on public transportation if possible (such as bus, train, or airplane) or o Sent to the Emergency Department by EMS for evaluation, COVID-19 testing, and possible  admission depending on your condition and test results.  What to do if you are LOW RISK for COVID-19?  Reduce your risk of any infection by using the same precautions used for avoiding the common cold or flu:  . Wash your hands often with soap and warm water for at least 20 seconds.  If soap and water are not readily available, use an alcohol-based hand sanitizer with at least 60% alcohol.  . If coughing or sneezing, cover your mouth and nose by coughing or sneezing into the elbow areas of your shirt or coat, into a tissue or into your sleeve (not your hands). . Avoid shaking hands with others and consider head nods or verbal greetings only. . Avoid touching your eyes, nose, or mouth with unwashed hands.  . Avoid close contact with people who are sick. . Avoid places or events with large numbers of people in one location, like concerts or sporting events. . Carefully consider travel plans you have or are making. . If you are planning any travel outside or inside the [place], visit the CDC's Travelers' Health webpage for the latest health notices. . If you have some symptoms but not all symptoms, continue to monitor at home and seek medical attention if your symptoms worsen. . If you are having a medical emergency, call 911.   ADDITIONAL HEALTHCARE OPTIONS FOR PATIENTS  [facility] / e-Visit: [email or web address]         [facility]: [phone]  [facility]: [phone]                   [facility]: [phone]   

## 2019-12-24 ENCOUNTER — Telehealth: Payer: Self-pay | Admitting: *Deleted

## 2019-12-24 NOTE — Telephone Encounter (Signed)
RETURNED PATIENT'S PHONE CALL, SPOKE WITH PATIENT. ?

## 2019-12-28 ENCOUNTER — Other Ambulatory Visit: Payer: Self-pay

## 2019-12-28 ENCOUNTER — Ambulatory Visit (HOSPITAL_COMMUNITY)
Admission: RE | Admit: 2019-12-28 | Discharge: 2019-12-28 | Disposition: A | Discharge status: Home / Self Care | Payer: Medicare Other | Source: Ambulatory Visit | Attending: Radiation Oncology | Admitting: Radiation Oncology

## 2019-12-28 DIAGNOSIS — C3432 Malignant neoplasm of lower lobe, left bronchus or lung: Secondary | ICD-10-CM

## 2019-12-28 DIAGNOSIS — I2510 Atherosclerotic heart disease of native coronary artery without angina pectoris: Secondary | ICD-10-CM | POA: Insufficient documentation

## 2019-12-28 DIAGNOSIS — I700 Atherosclerosis of aorta: Secondary | ICD-10-CM | POA: Insufficient documentation

## 2019-12-28 DIAGNOSIS — H938X2 Other specified disorders of left ear: Secondary | ICD-10-CM | POA: Diagnosis not present

## 2019-12-28 DIAGNOSIS — H60391 Other infective otitis externa, right ear: Secondary | ICD-10-CM | POA: Diagnosis not present

## 2019-12-28 DIAGNOSIS — N132 Hydronephrosis with renal and ureteral calculous obstruction: Secondary | ICD-10-CM | POA: Diagnosis not present

## 2019-12-28 DIAGNOSIS — J439 Emphysema, unspecified: Secondary | ICD-10-CM | POA: Insufficient documentation

## 2019-12-28 DIAGNOSIS — C3490 Malignant neoplasm of unspecified part of unspecified bronchus or lung: Secondary | ICD-10-CM | POA: Diagnosis not present

## 2019-12-28 LAB — GLUCOSE, CAPILLARY: Glucose-Capillary: 114 mg/dL — ABNORMAL HIGH (ref 70–99)

## 2019-12-28 MED ORDER — FLUDEOXYGLUCOSE F - 18 (FDG) INJECTION
7.7400 | Freq: Once | INTRAVENOUS | Status: AC | PRN
  Administered 2019-12-28: 7.74 via INTRAVENOUS

## 2019-12-31 ENCOUNTER — Encounter: Payer: Self-pay | Admitting: Radiation Oncology

## 2019-12-31 ENCOUNTER — Other Ambulatory Visit: Payer: Self-pay

## 2019-12-31 ENCOUNTER — Ambulatory Visit
Admission: RE | Admit: 2019-12-31 | Discharge: 2019-12-31 | Disposition: A | Discharge status: Home / Self Care | Payer: Medicare Other | Source: Ambulatory Visit | Attending: Radiation Oncology | Admitting: Radiation Oncology

## 2019-12-31 DIAGNOSIS — I700 Atherosclerosis of aorta: Secondary | ICD-10-CM | POA: Diagnosis not present

## 2019-12-31 DIAGNOSIS — Z0000 Encounter for general adult medical examination without abnormal findings: Secondary | ICD-10-CM | POA: Diagnosis not present

## 2019-12-31 DIAGNOSIS — Z79899 Other long term (current) drug therapy: Secondary | ICD-10-CM | POA: Diagnosis not present

## 2019-12-31 DIAGNOSIS — N132 Hydronephrosis with renal and ureteral calculous obstruction: Secondary | ICD-10-CM | POA: Insufficient documentation

## 2019-12-31 DIAGNOSIS — J439 Emphysema, unspecified: Secondary | ICD-10-CM | POA: Diagnosis not present

## 2019-12-31 DIAGNOSIS — C3432 Malignant neoplasm of lower lobe, left bronchus or lung: Secondary | ICD-10-CM | POA: Insufficient documentation

## 2019-12-31 DIAGNOSIS — I2510 Atherosclerotic heart disease of native coronary artery without angina pectoris: Secondary | ICD-10-CM | POA: Diagnosis not present

## 2019-12-31 DIAGNOSIS — R911 Solitary pulmonary nodule: Secondary | ICD-10-CM | POA: Diagnosis not present

## 2019-12-31 DIAGNOSIS — D491 Neoplasm of unspecified behavior of respiratory system: Secondary | ICD-10-CM | POA: Diagnosis not present

## 2019-12-31 DIAGNOSIS — Z85118 Personal history of other malignant neoplasm of bronchus and lung: Secondary | ICD-10-CM | POA: Diagnosis not present

## 2019-12-31 DIAGNOSIS — R590 Localized enlarged lymph nodes: Secondary | ICD-10-CM | POA: Diagnosis not present

## 2019-12-31 DIAGNOSIS — Z125 Encounter for screening for malignant neoplasm of prostate: Secondary | ICD-10-CM | POA: Diagnosis not present

## 2019-12-31 DIAGNOSIS — Z923 Personal history of irradiation: Secondary | ICD-10-CM | POA: Diagnosis not present

## 2019-12-31 DIAGNOSIS — E785 Hyperlipidemia, unspecified: Secondary | ICD-10-CM | POA: Diagnosis not present

## 2019-12-31 NOTE — Progress Notes (Signed)
Radiation Oncology         [phone] ________________________________  Name: Dylan Harvey MRN: [id number]  Date: 12/31/2019  DOB: 19-May-1950  Follow-Up Visit Note  CC: Crist Infante, MD  Crist Infante, MD    ICD-10-CM   1. 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 PET scan was performed on 12/28/2019 and showed a hypermetabolic left upper lobe nodule that was consistent with disease recurrence. There was also suspicion for an associated hypermetabolic left hilar lymph node. Additionally, there were noted to be renal stones and high attenuation in the right renal pelvis that may have been due to clot/hemorrhage with mild associated pelvocaliectasis. Finally, there was anterolateral bladder wall thickening that may have been due to an element of outlet obstruction given the slightly enlarged prostate.  On review of systems, he reports no complaints today.  ALLERGIES:  is allergic to neomycin-bacitracin zn-polymyx.  Meds: Current Outpatient Medications  Medication Sig Dispense Refill   Alfalfa 500 MG TABS Take 2,000-2,500 tablets by mouth See admin instructions. Take 2000 mg by mouth at lunch, 2000 mg at dinner and 2500 mgbefore bed     b complex vitamins tablet Take 1 tablet by mouth daily.     cholecalciferol (VITAMIN D) 1000 units tablet Take 1,000 Units by mouth daily.     fexofenadine (ALLEGRA) 60 MG tablet Take 60 mg by mouth 2 (two) times daily.     Flaxseed, Linseed, (RA FLAX SEED OIL 1000 PO) Take 1,000 mg by mouth daily.      Green Tea, Camellia sinensis, (GREEN TEA PO) Take 500 mg by mouth daily.     Lecithin 1200 MG CAPS Take  1,200 mg by mouth daily.     lisinopril (ZESTRIL) 10 MG tablet Take 10 mg by mouth daily.     Multiple Vitamin (MULTIVITAMIN WITH MINERALS) TABS Take 1 tablet by mouth daily.     Omega-3 Fatty Acids (FISH OIL) 1200 MG CAPS Take 1,200 mg by mouth daily.     Probiotic Product (PROBIOTIC PO) Take 1 capsule by mouth daily. 20 billion with 4 strains     PSYLLIUM HUSK PO Take 4 tablets by mouth 3 (three) times daily. 4 tabs lunch, 4 dinner, 4 one hour before bed      sodium chloride (OCEAN) 0.65 % SOLN nasal spray Place 1 spray into the nose daily.     Turmeric 500 MG TABS Take 500 mg by mouth every other day.     vitamin C (ASCORBIC ACID) 500 MG tablet Take 500 mg by mouth daily.     zinc gluconate 50 MG tablet Take 50 mg by mouth daily.     No current facility-administered medications for this encounter.    Physical Findings: The patient is in no acute distress. Patient is alert and oriented.  Accompanied by his wife on evaluation today  height is 6' (1.829 m) and weight is 156 lb 4 oz (70.9 kg). His temporal temperature is 97.9 F (36.6 C). His blood pressure is 136/76 and his pulse is 81. His respiration is 18 and oxygen saturation is 100%.  Lungs are clear to auscultation bilaterally. Heart  has regular rate and rhythm. No palpable cervical, supraclavicular, or axillary adenopathy. Abdomen soft, non-tender, normal bowel sounds.    Lab Findings: Lab Results  Component Value Date   WBC 8.1 06/04/2018   HGB 13.6 06/04/2018   HCT 39.4 06/04/2018   MCV 95.2 06/04/2018   PLT 190 06/04/2018    Radiographic Findings: CT Chest Wo Contrast  Result Date: 12/17/2019 CLINICAL DATA:  Non-small cell lung cancer, assess treatment response EXAM: CT CHEST WITHOUT CONTRAST TECHNIQUE: Multidetector CT imaging of the chest was performed following the standard protocol without IV contrast. COMPARISON:  06/15/2019, 12/10/2018 FINDINGS: Cardiovascular: Scattered aortic atherosclerosis. Normal heart  size. Left coronary artery calcifications. No pericardial effusion. Mediastinum/Nodes: No enlarged mediastinal, hilar, or axillary lymph nodes. Thyroid gland, trachea, and esophagus demonstrate no significant findings. Lungs/Pleura: Moderate centrilobular emphysema. Redemonstrated subtle, bandlike post radiation scarring of the left midlung. There is a new spiculated nodule of the posterior left upper lobe abutting the fissure measuring approximately 1.6 x 1.4 cm (series 7, image 90). Previously treated primary nodule of the superior segment left lower lobe is not significantly changed, measuring no greater than 5 mm (series 7, image 94). There are additional stable, scattered small nodules in the left lower lobe measuring up to 8 mm (series 7, image 134). No pleural effusion or pneumothorax. Upper Abdomen: No acute abnormality. Musculoskeletal: No chest wall mass or suspicious bone lesions identified. IMPRESSION: 1. There is a new spiculated nodule of the posterior left upper lobe abutting the fissure measuring approximately 1.6 x 1.4 cm. This is significantly more nodular the expected appearance of developing radiation fibrosis and concerning for recurrent/metachronous malignancy. 2. Previously treated primary nodule of the superior segment left lower lobe is not significantly changed, measuring no greater than 5 mm. 3. There are additional stable, scattered small nodules in the left lower lobe measuring up to 8 mm, presumably benign incidental sequelae of prior infection or inflammation. Attention on follow-up. 4. Emphysema (ICD10-J43.9). 5. Coronary artery disease. Aortic Atherosclerosis (ICD10-I70.0). Electronically Signed   By: Eddie Candle M.D.   On: 12/17/2019 08:51   NM PET Image Restag (PS) Skull Base To Thigh  Result Date: 12/28/2019 CLINICAL DATA:  Subsequent treatment strategy for lung cancer. EXAM: NUCLEAR MEDICINE PET SKULL BASE TO THIGH TECHNIQUE: 7.7 mCi F-18 FDG was injected intravenously.  Full-ring PET imaging was performed from the skull base to thigh after the radiotracer. CT data was obtained and used for attenuation correction and anatomic localization. Fasting blood glucose: 114 mg/dl COMPARISON:  CT chest 12/16/2019 and PET 05/26/2018. FINDINGS: Mediastinal blood pool activity: SUV max trauma 2.7 Liver activity: SUV max NA NECK: No abnormal hypermetabolism. Incidental CT findings: None. CHEST: A 0.8 x 1.0 cm nodule in the posterior left upper lobe (8/41) has an SUV max of 8.0. There is mild focal hypermetabolism in the left hilum, SUV max 3.2. Assessment of the left hilum on CT is limited without IV contrast. No hypermetabolic mediastinal or axillary lymph nodes. Incidental CT findings: Atherosclerotic calcification of the aorta, aortic valve and coronary arteries. Heart size within normal limits. No pericardial or pleural effusion. Centrilobular emphysema. Post radiation changes and fiducial markers in the perihilar left hemithorax. ABDOMEN/PELVIS: No abnormal hypermetabolism in the liver, adrenal glands, spleen or pancreas. No hypermetabolic lymph nodes. Incidental CT findings: Liver, gallbladder and adrenal glands are unremarkable. Stones and high attenuation are seen in the right renal pelvis, with associated pelvocaliectasis. Low-attenuation lesions in the right kidney measure up to 3.2 cm and are likely  cysts. Left kidney, spleen, pancreas, stomach and bowel are grossly unremarkable. Atherosclerotic calcification of the aorta without aneurysm. Anterolateral bladder wall thickening. Prostate is minimally prominent. SKELETON: No abnormal hypermetabolism. Incidental CT findings: Degenerative changes in the spine. IMPRESSION: 1. Hypermetabolic left upper lobe nodule, consistent with disease recurrence. Suspect an associated hypermetabolic left hilar lymph node. 2. Left renal stones. High attenuation in the right renal pelvis may be due to clot/hemorrhage. Mild associated pelvocaliectasis.  Please correlate clinically. 3. Anterolateral bladder wall thickening may be due to an element of outlet obstruction given slightly enlarged prostate. 4. Aortic atherosclerosis (ICD10-I70.0). Coronary artery calcification. 5.  Emphysema (ICD10-J43.9). Electronically Signed   By: Lorin Picket M.D.   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    Recent PET scan showed a hypermetabolic left upper lobe nodule that was consistent with disease recurrence. There was also suspicion for an associated hypermetabolic left hilar lymph node.  I have carefully reviewed the patients  PET scan regarding the left hilar region and I not concerned at this point if this is metastatic disease, SUV activity is minimall in this region.  I have carefully reviewed the patient's previous radiation treatments as it relates to his current PET positive nodule.  This is in a different location from his previous treatment and he would therefore be a candidate for SBRT directed to this new lesion.  Pending on the final plan there may be some potential overlap but does not appear to be over critical structures.  We discussed additional management including potential biopsy of this area and thoracic surgery consultation.  The patient does not wish to proceed with either of these issues.  Patient feels comfortable given the PET scan findings to proceed with stereotactic body radiation therapy.  We discussed the general course of treatment side effects and potential toxicities of this therapy in detail.  The patient appears to understand and wishes to proceed with planned course of treatment.    Plan: Patient will undergo SBRT simulation on October 21 with treatments to begin approximately a week later.  Anticipate between 3 and 5 SBRT treatments.  Total time spent in this encounter was 35 minutes which included reviewing the patient's most  recent PET scan, physical examination, and documentation. ___________________________________   Blair Promise, PhD, MD  This document serves as a record of services personally performed by Gery Pray, MD. It was created on his behalf by Clerance Lav, a trained medical scribe. The creation of this record is based on the scribe's personal observations and the provider's statements to them. This document has been checked and approved by the attending provider.

## 2019-12-31 NOTE — Progress Notes (Signed)
Patient here for a f/u visit with Dr. Sondra Come and to review his PET scan.  Interval Since Last Radiation: One year and five months  07/24/2018, 07/28/2018, 07/31/2018: LLL / 54 Gy in 3 fractions (SBRT)  Patient denies any problems today.   BP 136/76 (BP Location: Left Arm, Patient Position: Sitting)   Pulse 81   Temp 97.9 F (36.6 C) (Temporal)   Resp 18   Ht 6' (1.829 m)   Wt 156 lb 4 oz (70.9 kg)   SpO2 100%   BMI 21.19 kg/m   Wt Readings from Last 3 Encounters:  12/31/19 156 lb 4 oz (70.9 kg)  12/17/19 155 lb 9.6 oz (70.6 kg)  06/15/19 159 lb 4 oz (72.2 kg)

## 2020-01-07 ENCOUNTER — Ambulatory Visit
Admission: RE | Admit: 2020-01-07 | Discharge: 2020-01-07 | Disposition: A | Discharge status: Home / Self Care | Payer: Medicare Other | Source: Ambulatory Visit | Attending: Radiation Oncology | Admitting: Radiation Oncology

## 2020-01-07 DIAGNOSIS — R82998 Other abnormal findings in urine: Secondary | ICD-10-CM | POA: Diagnosis not present

## 2020-01-07 DIAGNOSIS — E871 Hypo-osmolality and hyponatremia: Secondary | ICD-10-CM | POA: Diagnosis not present

## 2020-01-07 DIAGNOSIS — Z1331 Encounter for screening for depression: Secondary | ICD-10-CM | POA: Diagnosis not present

## 2020-01-07 DIAGNOSIS — F17200 Nicotine dependence, unspecified, uncomplicated: Secondary | ICD-10-CM | POA: Diagnosis not present

## 2020-01-07 DIAGNOSIS — K5730 Diverticulosis of large intestine without perforation or abscess without bleeding: Secondary | ICD-10-CM | POA: Diagnosis not present

## 2020-01-07 DIAGNOSIS — J449 Chronic obstructive pulmonary disease, unspecified: Secondary | ICD-10-CM | POA: Diagnosis not present

## 2020-01-07 DIAGNOSIS — Z0000 Encounter for general adult medical examination without abnormal findings: Secondary | ICD-10-CM | POA: Diagnosis not present

## 2020-01-07 DIAGNOSIS — R319 Hematuria, unspecified: Secondary | ICD-10-CM | POA: Diagnosis not present

## 2020-01-07 DIAGNOSIS — E785 Hyperlipidemia, unspecified: Secondary | ICD-10-CM | POA: Diagnosis not present

## 2020-01-07 DIAGNOSIS — R911 Solitary pulmonary nodule: Secondary | ICD-10-CM | POA: Diagnosis not present

## 2020-01-07 DIAGNOSIS — R809 Proteinuria, unspecified: Secondary | ICD-10-CM | POA: Diagnosis not present

## 2020-01-07 DIAGNOSIS — I10 Essential (primary) hypertension: Secondary | ICD-10-CM | POA: Diagnosis not present

## 2020-01-07 DIAGNOSIS — C3490 Malignant neoplasm of unspecified part of unspecified bronchus or lung: Secondary | ICD-10-CM | POA: Diagnosis not present

## 2020-01-07 DIAGNOSIS — C3432 Malignant neoplasm of lower lobe, left bronchus or lung: Secondary | ICD-10-CM | POA: Diagnosis not present

## 2020-01-07 DIAGNOSIS — D126 Benign neoplasm of colon, unspecified: Secondary | ICD-10-CM | POA: Diagnosis not present

## 2020-01-07 DIAGNOSIS — Z23 Encounter for immunization: Secondary | ICD-10-CM | POA: Diagnosis not present

## 2020-01-07 DIAGNOSIS — M1990 Unspecified osteoarthritis, unspecified site: Secondary | ICD-10-CM | POA: Diagnosis not present

## 2020-01-14 DIAGNOSIS — R911 Solitary pulmonary nodule: Secondary | ICD-10-CM | POA: Diagnosis not present

## 2020-01-14 DIAGNOSIS — C3432 Malignant neoplasm of lower lobe, left bronchus or lung: Secondary | ICD-10-CM | POA: Diagnosis not present

## 2020-01-18 ENCOUNTER — Other Ambulatory Visit: Payer: Self-pay

## 2020-01-18 ENCOUNTER — Ambulatory Visit
Admission: RE | Admit: 2020-01-18 | Discharge: 2020-01-18 | Disposition: A | Discharge status: Home / Self Care | Payer: Medicare Other | Source: Ambulatory Visit | Attending: Radiation Oncology | Admitting: Radiation Oncology

## 2020-01-18 DIAGNOSIS — C3432 Malignant neoplasm of lower lobe, left bronchus or lung: Secondary | ICD-10-CM | POA: Diagnosis not present

## 2020-01-19 ENCOUNTER — Ambulatory Visit: Payer: Medicare Other | Admitting: Radiation Oncology

## 2020-01-20 ENCOUNTER — Ambulatory Visit
Admission: RE | Admit: 2020-01-20 | Discharge: 2020-01-20 | Disposition: A | Discharge status: Home / Self Care | Payer: Medicare Other | Source: Ambulatory Visit | Attending: Radiation Oncology | Admitting: Radiation Oncology

## 2020-01-20 ENCOUNTER — Ambulatory Visit: Payer: Medicare Other | Admitting: Radiation Oncology

## 2020-01-20 DIAGNOSIS — C3432 Malignant neoplasm of lower lobe, left bronchus or lung: Secondary | ICD-10-CM

## 2020-01-20 DIAGNOSIS — M1711 Unilateral primary osteoarthritis, right knee: Secondary | ICD-10-CM | POA: Diagnosis not present

## 2020-01-21 ENCOUNTER — Ambulatory Visit: Payer: Medicare Other | Admitting: Radiation Oncology

## 2020-01-21 DIAGNOSIS — R319 Hematuria, unspecified: Secondary | ICD-10-CM | POA: Diagnosis not present

## 2020-01-25 ENCOUNTER — Ambulatory Visit
Admission: RE | Admit: 2020-01-25 | Discharge: 2020-01-25 | Disposition: A | Discharge status: Home / Self Care | Payer: Medicare Other | Source: Ambulatory Visit | Attending: Radiation Oncology | Admitting: Radiation Oncology

## 2020-01-25 ENCOUNTER — Encounter: Payer: Self-pay | Admitting: Radiation Oncology

## 2020-01-25 DIAGNOSIS — C3432 Malignant neoplasm of lower lobe, left bronchus or lung: Secondary | ICD-10-CM | POA: Diagnosis not present

## 2020-01-25 DIAGNOSIS — R911 Solitary pulmonary nodule: Secondary | ICD-10-CM | POA: Diagnosis not present

## 2020-02-20 DIAGNOSIS — Z23 Encounter for immunization: Secondary | ICD-10-CM | POA: Diagnosis not present

## 2020-02-25 ENCOUNTER — Encounter: Payer: Self-pay | Admitting: Radiation Oncology

## 2020-02-25 ENCOUNTER — Ambulatory Visit
Admission: RE | Admit: 2020-02-25 | Discharge: 2020-02-25 | Disposition: A | Discharge status: Home / Self Care | Payer: Medicare Other | Source: Ambulatory Visit | Attending: Radiation Oncology | Admitting: Radiation Oncology

## 2020-02-25 ENCOUNTER — Other Ambulatory Visit: Payer: Self-pay

## 2020-02-25 VITALS — BP 115/74 | HR 79 | Temp 97.8°F | Resp 18 | Ht 72.0 in | Wt 156.0 lb

## 2020-02-25 DIAGNOSIS — Z923 Personal history of irradiation: Secondary | ICD-10-CM | POA: Insufficient documentation

## 2020-02-25 DIAGNOSIS — Z79899 Other long term (current) drug therapy: Secondary | ICD-10-CM | POA: Insufficient documentation

## 2020-02-25 DIAGNOSIS — C3432 Malignant neoplasm of lower lobe, left bronchus or lung: Secondary | ICD-10-CM | POA: Diagnosis not present

## 2020-02-25 NOTE — Progress Notes (Signed)
Mr. Dylan Harvey presents today for follow-up after completing SBRT to his left lung on 01/25/2020 (also received SBRT to left lower lobe, last treatment 07/31/2018)  Fatigue:None Pain: None SOB: none Cough:None Appetite: good  Other issues of note:  Vitals:   02/25/20 1150  BP: 115/74  Pulse: 79  Resp: 18  Temp: 97.8 F (36.6 C)  SpO2: 100%  Weight: 70.8 kg  Height: 6' (1.829 m)

## 2020-02-25 NOTE — Progress Notes (Signed)
Radiation Oncology         [phone]) [phone] ________________________________  Name: Dylan Harvey MRN: [id number]  Date: 02/25/2020  DOB: [birth year]  Follow-Up Visit Note  CC: Crist Infante, MD  Crist Infante, MD    ICD-10-CM   1. 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 No significant interval history since the end of treatment.  On review of systems, he reports reports feeling well.  He did not experience any side effects after his radiation therapy. He denies cough or shortness of breath or hemoptysis.                         ALLERGIES:  is allergic to neomycin-bacitracin zn-polymyx.  Meds: Current Outpatient Medications  Medication Sig Dispense Refill  . Alfalfa 500 MG TABS Take 2,000-2,500 tablets by mouth See admin instructions. Take 2000 mg by mouth at lunch, 2000 mg at dinner and 2500 mgbefore bed    . b complex vitamins tablet Take 1 tablet by mouth daily.    . cholecalciferol (VITAMIN D) 1000 units tablet Take 1,000 Units by mouth daily.    . fexofenadine (ALLEGRA) 60 MG tablet Take 60 mg by mouth 2 (two) times daily.    . Flaxseed, Linseed, (RA FLAX SEED OIL 1000 PO) Take 1,000 mg by mouth daily.     [place]yoka Cowden Tea, Camellia sinensis, (GREEN TEA PO) Take 500 mg by mouth daily.    . Lecithin 1200 MG CAPS Take 1,200 mg by mouth daily.    Marland Kitchen lisinopril (ZESTRIL) 10 MG tablet Take 10 mg by mouth daily.    . Multiple Vitamin (MULTIVITAMIN WITH MINERALS) TABS Take 1 tablet by mouth daily.    . Omega-3 Fatty Acids (FISH OIL) 1200 MG CAPS  Take 1,200 mg by mouth daily.    . Probiotic Product (PROBIOTIC PO) Take 1 capsule by mouth daily. 20 billion with 4 strains    . PSYLLIUM HUSK PO Take 4 tablets by mouth 3 (three) times daily. 4 tabs lunch, 4 dinner, 4 one hour before bed    . sodium chloride (OCEAN) 0.65 % SOLN nasal spray Place 1 spray into the nose daily.    . Turmeric 500 MG TABS Take 500 mg by mouth every other day.    . vitamin C (ASCORBIC ACID) 500 MG tablet Take 500 mg by mouth daily.    Marland Kitchen zinc gluconate 50 MG tablet Take 50 mg by mouth daily.     No current facility-administered medications for this encounter.    Physical Findings: The patient is in no acute distress. Patient is alert and oriented.  height is 6' (1.829 m) and weight is 156 lb (70.8 kg). His temperature is 97.8 F (36.6 C). His blood pressure is 115/74 and his pulse is 79. His respiration is 18 and oxygen saturation is 100%.  No significant changes. Lungs are clear to auscultation bilaterally. Heart has regular rate and rhythm. No palpable cervical, supraclavicular, or axillary adenopathy. Abdomen soft, non-tender, normal bowel sounds.   Lab  Findings: Lab Results  Component Value Date   WBC 8.1 06/04/2018   HGB 13.6 06/04/2018   HCT 39.4 06/04/2018   MCV 95.2 06/04/2018   PLT 190 06/04/2018    Radiographic Findings: No results found.  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  Plan: The patient will follow up with radiation oncology in 3 months.  Prior to this visit the patient will undergo a chest CT scan to assess his response to his most recent course of SBRT.    ____________________________________   Blair Promise, PhD, MD  This document serves as a record of services personally performed by Gery Pray, MD. It was created on his behalf by Clerance Lav, a trained medical scribe. The creation of  this record is based on the scribe's personal observations and the provider's statements to them. This document has been checked and approved by the attending provider.

## 2020-02-25 NOTE — Progress Notes (Incomplete)
  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 He did not report any radiation-related side effects.  Plan: The patient will follow-up with radiation oncology in one month.  ________________________________________________   Blair Promise, PhD, MD  This document serves as a record of services personally performed by Gery Pray, MD. It was created on his behalf by Clerance Lav, a trained medical scribe. The creation of this record is based on the scribe's personal observations and the provider's statements to them. This document has been checked and approved by the attending provider.

## 2020-02-29 DIAGNOSIS — H6122 Impacted cerumen, left ear: Secondary | ICD-10-CM | POA: Diagnosis not present

## 2020-03-08 DIAGNOSIS — Z1212 Encounter for screening for malignant neoplasm of rectum: Secondary | ICD-10-CM | POA: Diagnosis not present

## 2020-04-25 DIAGNOSIS — M1711 Unilateral primary osteoarthritis, right knee: Secondary | ICD-10-CM | POA: Diagnosis not present

## 2020-04-25 DIAGNOSIS — M7542 Impingement syndrome of left shoulder: Secondary | ICD-10-CM | POA: Diagnosis not present

## 2020-05-02 DIAGNOSIS — B369 Superficial mycosis, unspecified: Secondary | ICD-10-CM | POA: Diagnosis not present

## 2020-05-02 DIAGNOSIS — H6241 Otitis externa in other diseases classified elsewhere, right ear: Secondary | ICD-10-CM | POA: Diagnosis not present

## 2020-05-11 DIAGNOSIS — I10 Essential (primary) hypertension: Secondary | ICD-10-CM | POA: Diagnosis not present

## 2020-05-18 ENCOUNTER — Telehealth: Payer: Self-pay | Admitting: *Deleted

## 2020-05-18 NOTE — Telephone Encounter (Signed)
XXXX 

## 2020-05-18 NOTE — Telephone Encounter (Signed)
CALLED PATIENT TO INFORM OF CT FOR 05-30-20- ARRIVAL TIME- 11:15 AM @ [facility], NO RESTRICTIONS TO TEST, AND FU WITH DR. KINARD ON 06-02-20 @ 11:15 AM FOR RESULTS, SPOKE WITH PATIENT AND HE IS AWARE OF THESE APPTS.

## 2020-05-19 DIAGNOSIS — R310 Gross hematuria: Secondary | ICD-10-CM | POA: Diagnosis not present

## 2020-05-19 DIAGNOSIS — N281 Cyst of kidney, acquired: Secondary | ICD-10-CM | POA: Diagnosis not present

## 2020-05-25 ENCOUNTER — Ambulatory Visit (HOSPITAL_COMMUNITY): Payer: Medicare Other

## 2020-05-25 DIAGNOSIS — H43813 Vitreous degeneration, bilateral: Secondary | ICD-10-CM | POA: Diagnosis not present

## 2020-05-25 DIAGNOSIS — H2513 Age-related nuclear cataract, bilateral: Secondary | ICD-10-CM | POA: Diagnosis not present

## 2020-05-25 DIAGNOSIS — H25043 Posterior subcapsular polar age-related cataract, bilateral: Secondary | ICD-10-CM | POA: Diagnosis not present

## 2020-05-25 DIAGNOSIS — H524 Presbyopia: Secondary | ICD-10-CM | POA: Diagnosis not present

## 2020-05-26 ENCOUNTER — Ambulatory Visit: Payer: Self-pay | Admitting: Radiation Oncology

## 2020-05-26 ENCOUNTER — Encounter: Payer: Self-pay | Admitting: *Deleted

## 2020-05-30 ENCOUNTER — Other Ambulatory Visit: Payer: Self-pay

## 2020-05-30 ENCOUNTER — Ambulatory Visit (HOSPITAL_COMMUNITY)
Admission: RE | Admit: 2020-05-30 | Discharge: 2020-05-30 | Disposition: A | Discharge status: Home / Self Care | Payer: Medicare Other | Source: Ambulatory Visit | Attending: Radiation Oncology | Admitting: Radiation Oncology

## 2020-05-30 DIAGNOSIS — J432 Centrilobular emphysema: Secondary | ICD-10-CM | POA: Diagnosis not present

## 2020-05-30 DIAGNOSIS — C3432 Malignant neoplasm of lower lobe, left bronchus or lung: Secondary | ICD-10-CM | POA: Diagnosis not present

## 2020-05-30 DIAGNOSIS — I358 Other nonrheumatic aortic valve disorders: Secondary | ICD-10-CM | POA: Diagnosis not present

## 2020-05-30 DIAGNOSIS — I2510 Atherosclerotic heart disease of native coronary artery without angina pectoris: Secondary | ICD-10-CM | POA: Diagnosis not present

## 2020-06-02 ENCOUNTER — Ambulatory Visit
Admission: RE | Admit: 2020-06-02 | Discharge: 2020-06-02 | Disposition: A | Discharge status: Home / Self Care | Payer: Medicare Other | Source: Ambulatory Visit | Attending: Radiation Oncology | Admitting: Radiation Oncology

## 2020-06-02 ENCOUNTER — Encounter: Payer: Self-pay | Admitting: Radiation Oncology

## 2020-06-02 ENCOUNTER — Other Ambulatory Visit: Payer: Self-pay

## 2020-06-02 DIAGNOSIS — Z923 Personal history of irradiation: Secondary | ICD-10-CM | POA: Diagnosis not present

## 2020-06-02 DIAGNOSIS — C3432 Malignant neoplasm of lower lobe, left bronchus or lung: Secondary | ICD-10-CM | POA: Diagnosis not present

## 2020-06-02 DIAGNOSIS — Z08 Encounter for follow-up examination after completed treatment for malignant neoplasm: Secondary | ICD-10-CM | POA: Diagnosis not present

## 2020-06-02 DIAGNOSIS — Z79899 Other long term (current) drug therapy: Secondary | ICD-10-CM | POA: Insufficient documentation

## 2020-06-02 DIAGNOSIS — J432 Centrilobular emphysema: Secondary | ICD-10-CM | POA: Diagnosis not present

## 2020-06-02 DIAGNOSIS — I700 Atherosclerosis of aorta: Secondary | ICD-10-CM | POA: Diagnosis not present

## 2020-06-02 DIAGNOSIS — I2510 Atherosclerotic heart disease of native coronary artery without angina pectoris: Secondary | ICD-10-CM | POA: Insufficient documentation

## 2020-06-02 DIAGNOSIS — R911 Solitary pulmonary nodule: Secondary | ICD-10-CM | POA: Diagnosis not present

## 2020-06-02 NOTE — Progress Notes (Signed)
Radiation Oncology         [phone] ________________________________  Name: Dylan Harvey MRN: [id number]  Date: 06/02/2020  DOB: 02-27-1951  Follow-Up Visit Note  CC: Crist Infante, MD  Crist Infante, MD    ICD-10-CM   1. 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 Since his last visit, he underwent a chest CT scan on 05/30/2020 that showed progressive radiation change about the posterior left upper lobe and superior segment of the left lower lobe. The posterior left upper lobe pulmonary nodule was decreased in size. The superior segment of the left lower lobe treated nodule was no longer readily apparent. There was no thoracic adenopathy, given the limitations of a non-contrast exam. The other tiny pulmonary nodules were similar and likely benign.  On review of systems, he reports feeling well. He denies cough or breathing problems.  He denies any hemoptysis or pain in the chest area.                  ALLERGIES:  is allergic to neomycin-bacitracin zn-polymyx.  Meds: Current Outpatient Medications  Medication Sig Dispense Refill  . Alfalfa 500 MG TABS Take 2,000-2,500 tablets by mouth See admin instructions. Take 2000 mg by mouth at lunch, 2000 mg at dinner and 2500 mgbefore bed    . b complex vitamins tablet Take 1 tablet by mouth daily.    . cholecalciferol (VITAMIN D) 1000 units tablet Take 1,000 Units by mouth daily.    . fexofenadine (ALLEGRA) 60 MG tablet Take 60 mg by mouth 2 (two) times daily.    . Flaxseed, Linseed,  (RA FLAX SEED OIL 1000 PO) Take 1,000 mg by mouth daily.     Nyoka Cowden Tea, Camellia sinensis, (GREEN TEA PO) Take 500 mg by mouth daily.    . Lecithin 1200 MG CAPS Take 1,200 mg by mouth daily.    Marland Kitchen lisinopril (ZESTRIL) 10 MG tablet Take 10 mg by mouth daily.    . Multiple Vitamin (MULTIVITAMIN WITH MINERALS) TABS Take 1 tablet by mouth daily.    . NON FORMULARY Take 2 capsules by mouth 2 (two) times daily. Relief Factor OTC supplement for generalized muscle aches    . Omega-3 Fatty Acids (FISH OIL) 1200 MG CAPS Take 1,200 mg by mouth daily.    . Probiotic Product (PROBIOTIC PO) Take 1 capsule by mouth daily. 20 billion with 4 strains    . PSYLLIUM HUSK PO Take 4 tablets by mouth 3 (three) times daily. 4 tabs lunch, 4 dinner, 4 one hour before bed    . sodium chloride (OCEAN) 0.65 % SOLN nasal spray Place 1 spray into the nose daily.    . vitamin C (ASCORBIC ACID) 500 MG tablet Take 500 mg by mouth daily.    Marland Kitchen zinc gluconate 50 MG tablet Take 50 mg by mouth daily.     No current facility-administered medications for this encounter.    Physical Findings: The patient is in no acute distress. Patient is alert and oriented.  height is 6' (  1.829 m) and weight is 159 lb 4 oz (72.2 kg). His temporal temperature is 97 F (36.1 C) (abnormal). His blood pressure is 139/75 and his pulse is 93. His respiration is 18 and oxygen saturation is 100%. . Lungs are clear to auscultation bilaterally. Heart has regular rate and rhythm. No palpable cervical, supraclavicular, or axillary adenopathy. Abdomen soft, non-tender, normal bowel sounds.   Lab Findings: Lab Results  Component Value Date   WBC 8.1 06/04/2018   HGB 13.6 06/04/2018   HCT 39.4 06/04/2018   MCV 95.2 06/04/2018   PLT 190 06/04/2018    Radiographic Findings: CT Chest Wo Contrast  Result Date: 05/30/2020 CLINICAL DATA:  Non-small-cell lung cancer of left lower lobe. Completed therapy in November. No surgery or symptoms. EXAM: CT CHEST  WITHOUT CONTRAST TECHNIQUE: Multidetector CT imaging of the chest was performed following the standard protocol without IV contrast. COMPARISON:  12/16/2019 CT.  PET of 12/28/2019. FINDINGS: Cardiovascular: Aortic atherosclerosis. Tortuous thoracic aorta. Normal heart size with minimal anterior pericardial thickening. Multivessel coronary artery atherosclerosis. Aortic valve calcification. Mediastinum/Nodes: No mediastinal or definite hilar adenopathy, given limitations of unenhanced CT. Lungs/Pleura: No pleural fluid. Moderate centrilobular emphysema. Right greater than left base scarring. Progressive surrounding ground-glass interstitial thickening about the posterior left upper lobe and adjacent superior segment left lower lobe. The posterior left upper lobe irregular nodule is less well-defined today. Example 1.2 x 1.1 cm on 88/7 versus 1.6 x 1.4 cm on the prior diagnostic CT. 7 mm left lower lobe pulmonary nodule on 133/7 is similar to 8 mm on the prior. 4-5 mm more inferior posterior left lower lobe pulmonary nodule on 140/7 is unchanged. 2-3 mm left lower lobe pulmonary nodule on 124/7, similar. The previously treated superior segment left lower lobe pulmonary nodule is no longer readily apparent. Upper Abdomen: Normal imaged portions of the liver, spleen, stomach, pancreas, gallbladder, adrenal glands, kidneys. Musculoskeletal: No acute osseous abnormality. IMPRESSION: 1. Progressive radiation change about the posterior left upper lobe and superior segment left lower lobe. 2. Decreased size of posterior left upper lobe pulmonary nodule. The superior segment left lower lobe treated nodule is no longer readily apparent. 3. No thoracic adenopathy, given limitations of noncontrast exam. 4. Other tiny pulmonary nodules are similar and likely benign. 5. Aortic atherosclerosis (ICD10-I70.0), coronary artery atherosclerosis and emphysema (ICD10-J43.9). 6. Aortic valvular calcifications. Consider echocardiography to  evaluate for valvular dysfunction. Electronically Signed   By: Abigail Miyamoto M.D.   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 Recent chest CT scan showed progressive radiation change about the posterior left upper lobe and superior segment of the left lower lobe. The posterior left upper lobe pulmonary nodule was decreased in size. The superior segment of the left lower lobe treated nodule was no longer readily apparent. There was no thoracic adenopathy, given the limitations of a non-contrast exam. The other tiny pulmonary nodules were similar and likely benign.  Discussed CT findings in detail with patient.  He is pleased with the results.  We also printed a copy for him to review with his wife.  Plan: The patient will follow up with radiation oncology in 6 months. He will undergo a repeat chest CT scan prior to that visit.  Total time spent in this encounter was 15 minutes which included reviewing the patient's most recent chest CT scan, physical examination, ordering of  future chest CT scan, and documentation.  ____________________________________   Blair Promise, PhD, MD  This document serves as a record of services personally performed by Gery Pray, MD. It was created on his behalf by Clerance Lav, a trained medical scribe. The creation of this record is based on the scribe's personal observations and the provider's statements to them. This document has been checked and approved by the attending provider.

## 2020-06-02 NOTE — Progress Notes (Signed)
Patient is here today for follow up post radiation to left lung that completed November 2021.  Patient denies having any pain.  Denies shortness of breath or cough.  Reports appetite is good.  Denies fatigue.  Denies swallowing issues.  No skin issues during or after radiation.  Vitals:   06/02/20 1122  BP: 139/75  Pulse: 93  Resp: 18  Temp: (!) 97 F (36.1 C)  TempSrc: Temporal  SpO2: 100%  Weight: 159 lb 4 oz (72.2 kg)  Height: 6' (1.829 m)

## 2020-06-08 DIAGNOSIS — D494 Neoplasm of unspecified behavior of bladder: Secondary | ICD-10-CM | POA: Diagnosis not present

## 2020-06-08 DIAGNOSIS — M47816 Spondylosis without myelopathy or radiculopathy, lumbar region: Secondary | ICD-10-CM | POA: Diagnosis not present

## 2020-06-08 DIAGNOSIS — N3289 Other specified disorders of bladder: Secondary | ICD-10-CM | POA: Diagnosis not present

## 2020-06-08 DIAGNOSIS — R310 Gross hematuria: Secondary | ICD-10-CM | POA: Diagnosis not present

## 2020-06-13 DIAGNOSIS — D4101 Neoplasm of uncertain behavior of right kidney: Secondary | ICD-10-CM | POA: Diagnosis not present

## 2020-06-13 DIAGNOSIS — R310 Gross hematuria: Secondary | ICD-10-CM | POA: Diagnosis not present

## 2020-06-13 DIAGNOSIS — H60331 Swimmer's ear, right ear: Secondary | ICD-10-CM | POA: Diagnosis not present

## 2020-06-17 IMAGING — CT CT CHEST W/O CM
2 of 4 series · 15 of 36 positions shown, 18 images · non-contrast
Comparison: PET 05/26/2018 and CT chest 05/07/2018.

CLINICAL DATA: Lung cancer with locoregional recurrence, post
therapy, assess treatment response.

EXAM:
CT CHEST WITHOUT CONTRAST
TECHNIQUE: Multidetector CT imaging of the chest was performed following the
standard protocol without IV contrast.

[Series 3: thorax · axial · 0.77mm/px · z∈[+108,+404]mm · 12 of 176 slices shown, 15 images]
[im 14/176  mediastinal]
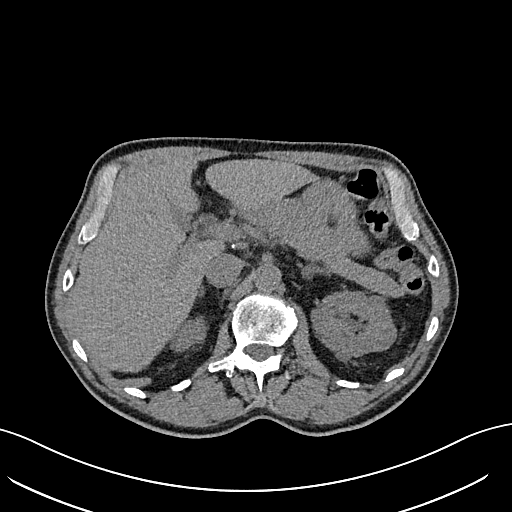
[im 14/176  lung]
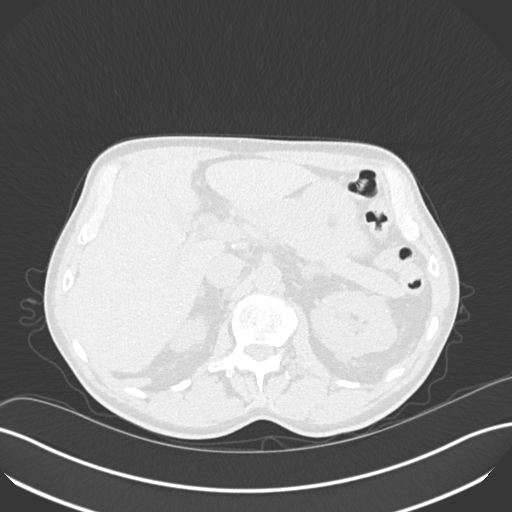
[im 27/176  lung]
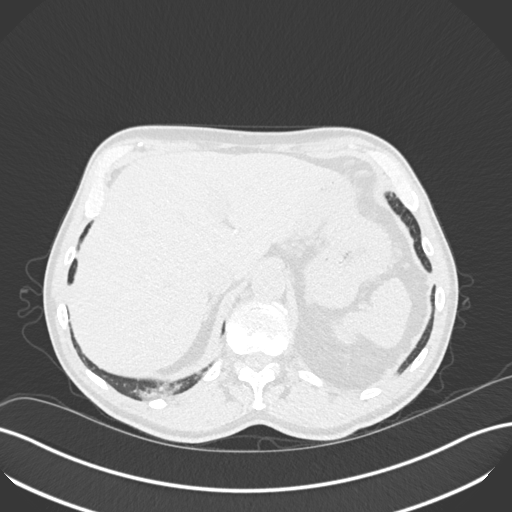
[im 41/176  lung]
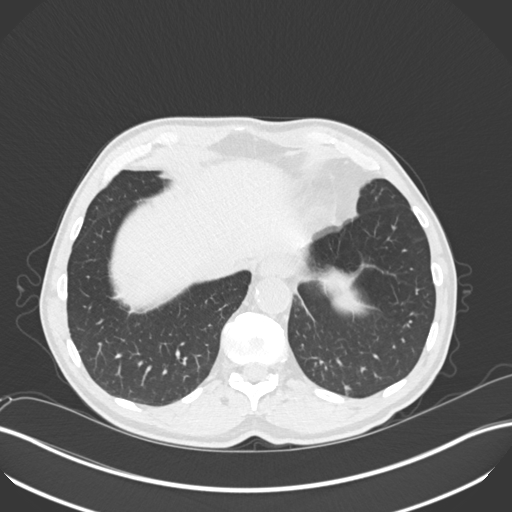
[im 54/176  lung]
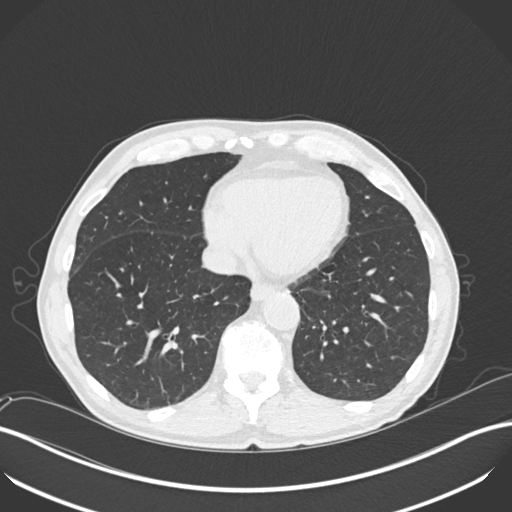
[im 68/176  mediastinal]
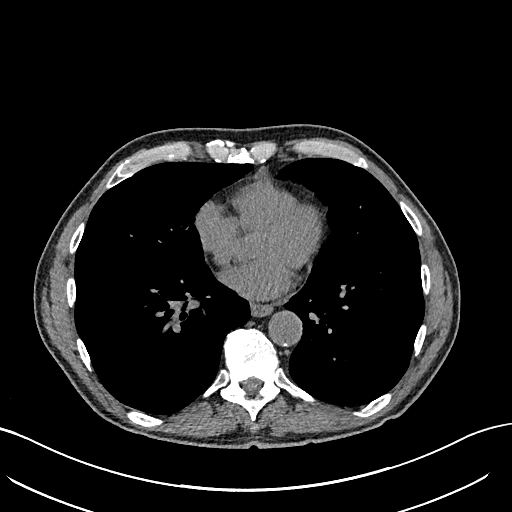
[im 68/176  lung]
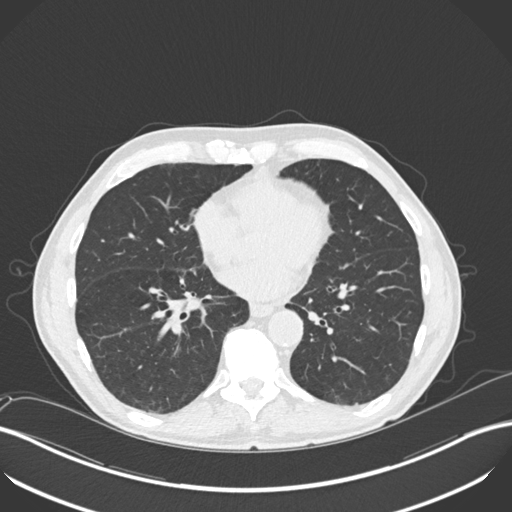
[im 81/176  lung]
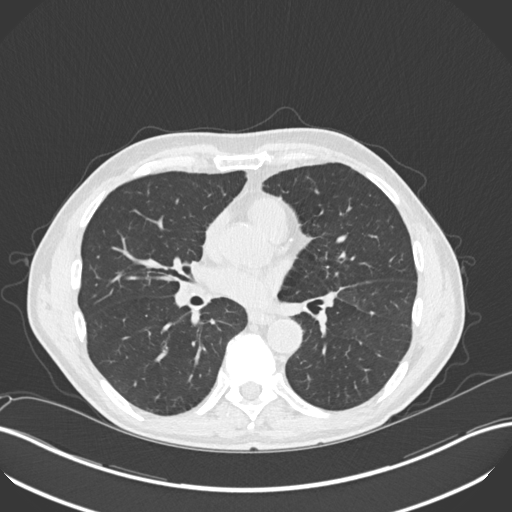
[im 95/176  lung]
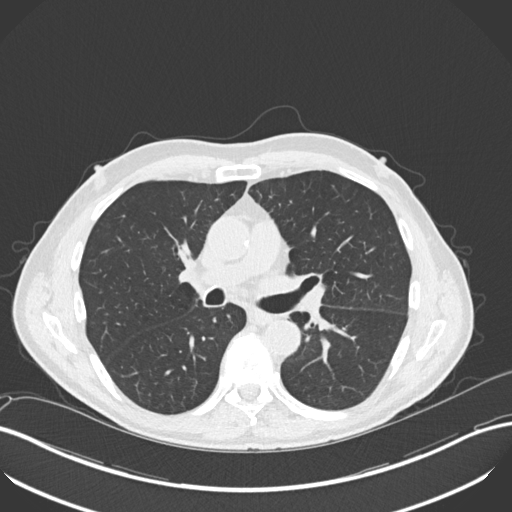
[im 108/176  lung]
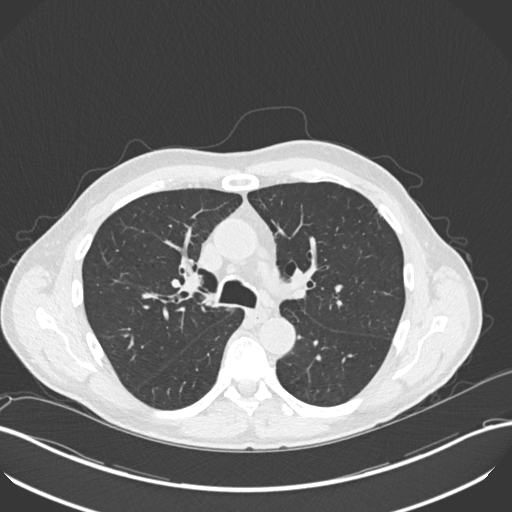
[im 122/176  mediastinal]
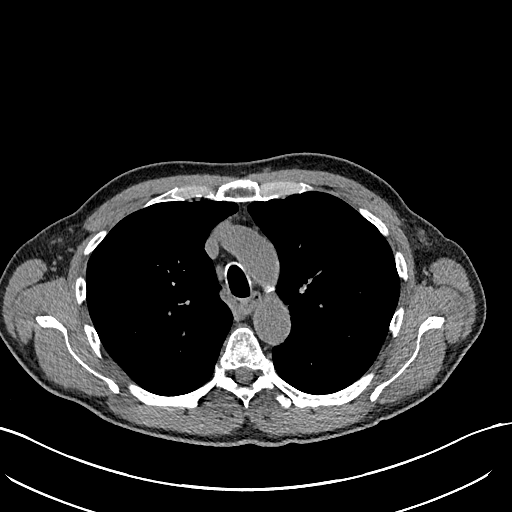
[im 122/176  lung]
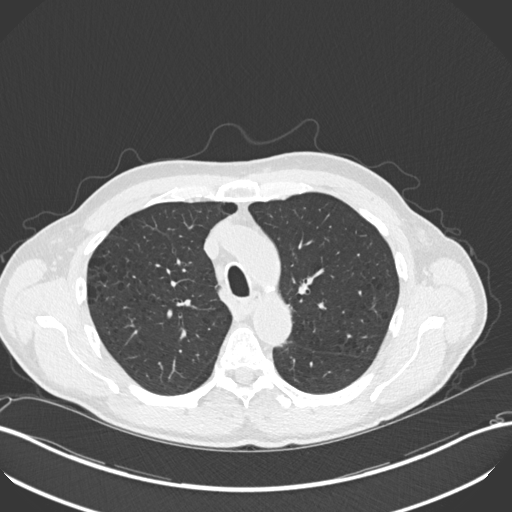
[im 135/176  lung]
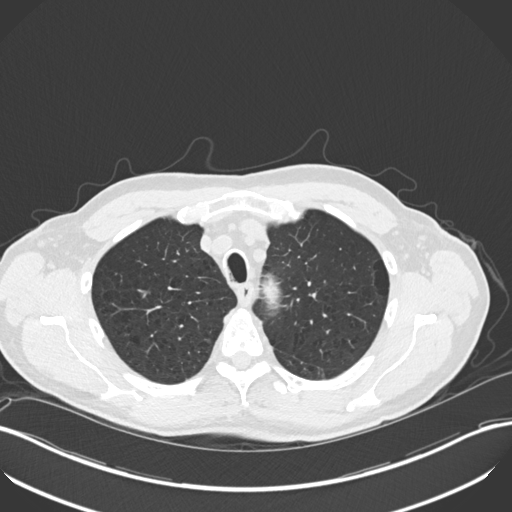
[im 149/176  lung]
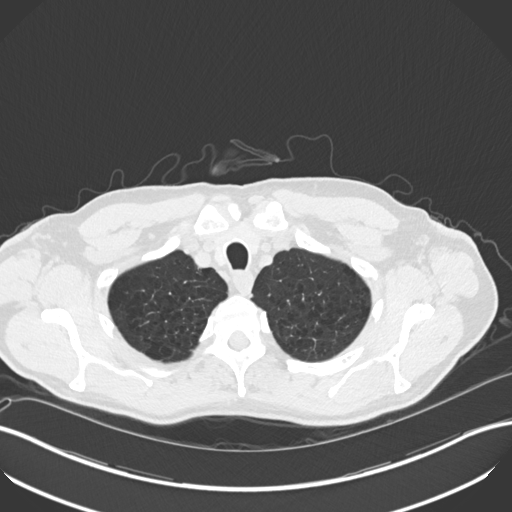
[im 162/176  lung]
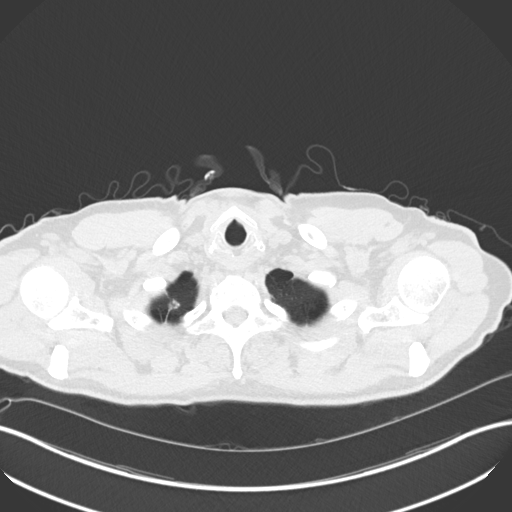

[Series 6: coronal · coronal · 0.78mm/px · 3 of 118 slices shown]
[im 24/118  lung]
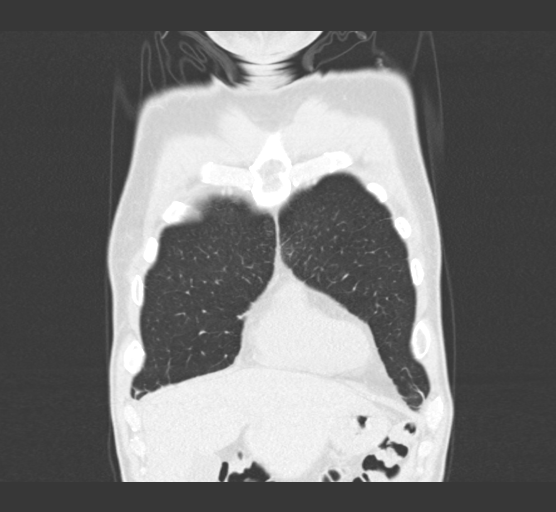
[im 47/118  lung]
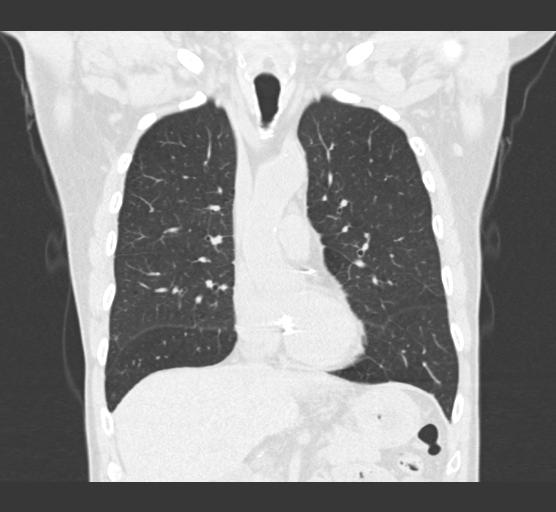
[im 71/118  lung]
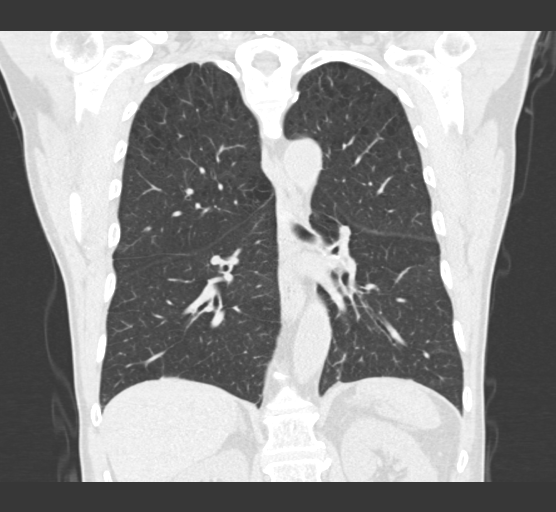

[15 of 36 positions shown; findings below may reference images not displayed]

FINDINGS: Cardiovascular: Atherosclerotic calcification of the aorta, aortic
valve and coronary arteries. Heart size normal. No pericardial
effusion.

Mediastinum/Nodes: No pathologically enlarged mediastinal or
axillary lymph nodes. Hilar regions are difficult to evaluate
without IV contrast. Esophagus is unremarkable.

Lungs/Pleura: Centrilobular and paraseptal emphysema. 8 x 9 mm left
lower lobe nodule (8/83) is stable from 05/07/2018. Additional small
pulmonary nodules in the left lower lobe measure up to 5 mm, stable.
No pleural fluid. Debris is seen dependently in the trachea. Airway
is otherwise unremarkable.

Upper Abdomen: Visualized portions of the liver, gallbladder and
right adrenal gland are unremarkable. 1.4 cm nodule in the medial
limb left adrenal gland measures 22 Hounsfield units and was not
hypermetabolic on comparison PET. Visualized portions of the
kidneys, spleen, pancreas, stomach and bowel are grossly
unremarkable. No upper abdominal adenopathy.

Musculoskeletal: Degenerative changes in the spine. No worrisome
lytic or sclerotic lesions. Old rib fracture.
IMPRESSION: 1. Left lower lobe nodule is stable. No evidence of metastatic
disease.
2. Left adrenal adenoma.
3. Aortic atherosclerosis (T3F6D-170.0). Coronary artery
calcification.
4.  Emphysema (T3F6D-TTJ.V).

## 2020-06-29 ENCOUNTER — Other Ambulatory Visit: Payer: Self-pay | Admitting: Ophthalmology

## 2020-06-29 DIAGNOSIS — C661 Malignant neoplasm of right ureter: Secondary | ICD-10-CM | POA: Diagnosis not present

## 2020-06-29 DIAGNOSIS — C641 Malignant neoplasm of right kidney, except renal pelvis: Secondary | ICD-10-CM | POA: Diagnosis not present

## 2020-07-08 DIAGNOSIS — R310 Gross hematuria: Secondary | ICD-10-CM | POA: Diagnosis not present

## 2020-07-08 DIAGNOSIS — C651 Malignant neoplasm of right renal pelvis: Secondary | ICD-10-CM | POA: Diagnosis not present

## 2020-07-11 DIAGNOSIS — H60331 Swimmer's ear, right ear: Secondary | ICD-10-CM | POA: Diagnosis not present

## 2020-07-11 DIAGNOSIS — Z9089 Acquired absence of other organs: Secondary | ICD-10-CM | POA: Insufficient documentation

## 2020-08-08 DIAGNOSIS — C651 Malignant neoplasm of right renal pelvis: Secondary | ICD-10-CM | POA: Diagnosis not present

## 2020-08-11 NOTE — Progress Notes (Deleted)
Cardiology Office Note:    Date:  08/11/2020   ID:  Dylan Harvey, DOB October 26, 1950, MRN [id number]  PCP:  Crist Infante, MD   [facility] Providers Cardiologist:  None {  Referring MD: Crist Infante, MD    History of Present Illness:    Dylan Harvey is a 70 y.o. male with a hx of coronary calcification on CT chest, HTN, HLD, history of lung cancer s/p XRT followed by Dr. Sondra Come, COPD and tobacco use who was referred by Dr. Joylene Draft for pre-operative evaluation prior to ***  Past Medical History:  Diagnosis Date  . Allergy   . Arthritis    thumbs  . Colonic adenoma 2009  . ED (erectile dysfunction)   . Emphysema lung ([facility]) MILD   . Gross hematuria   . Hematospermia   . History of colon polyps   . History of radiation therapy 01/18/20-01/25/2020   Left Lung 3 fx; Dr. Gery Pray  . History of radiation therapy 07/24/2018-07/31/2018   SBRT Left lung; Dr Gery Pray  . Hyperlipemia   . Hypertension   . Left testicular pain   . Lumbago   . Neoplasm of kidney   . Renal cancer ([place])   . Renal cyst   . Right testicular pain   . Tobacco abuse     Past Surgical History:  Procedure Laterality Date  . COLONOSCOPY    . FUDUCIAL PLACEMENT N/A 06/11/2018   Procedure: PLACEMENT OF FUDUCIAL;  Surgeon: Melrose Nakayama, MD;  Location: [facility] OR;  Service: Thoracic;  Laterality: N/A;  . HERNIA REPAIR     inguinal  . INNER EAR SURGERY    . POLYPECTOMY    . VIDEO BRONCHOSCOPY WITH ENDOBRONCHIAL NAVIGATION N/A 06/11/2018   Procedure: VIDEO BRONCHOSCOPY WITH ENDOBRONCHIAL NAVIGATION;  Surgeon: Melrose Nakayama, MD;  Location: [facility] OR;  Service: Thoracic;  Laterality: N/A;  . WISDOM TOOTH EXTRACTION      Current Medications: No outpatient medications have been marked as taking for the 08/12/20 encounter (Appointment) with Freada Bergeron, MD.     Allergies:   Neomycin-bacitracin zn-polymyx   Social History   Socioeconomic History  . Marital status: Married     Spouse name: Not on file  . Number of children: Not on file  . Years of education: Not on file  . Highest education level: Not on file  Occupational History  . Not on file  Tobacco Use  . Smoking status: Current Every Day Smoker    Packs/day: 1.00    Types: Cigarettes  . Smokeless tobacco: Never Used  Substance and Sexual Activity  . Alcohol use: Yes    Alcohol/week: 2.0 standard drinks    Types: 2 Shots of liquor per week    Comment: occasionally  . Drug use: No  . Sexual activity: Not on file  Other Topics Concern  . Not on file  Social History Narrative   Married 1980 no chilldren. Went to college   Occupation is Preservationist @ [employer]   Social Determinants of Radio broadcast assistant Strain: Not on file  Food Insecurity: Not on file  Transportation Needs: Not on file  Physical Activity: Not on file  Stress: Not on file  Social Connections: Not on file     Family History: The patient's ***family history includes Alzheimer's disease in his mother; Hypothyroidism in his mother; Lung cancer in his father; Prostate cancer in his father; Severe combined immunodeficiency in his mother. There is no  history of Colon cancer, Colon polyps, Esophageal cancer, Rectal cancer, or Stomach cancer.  ROS:   Please see the history of present illness.    *** All other systems reviewed and are negative.  EKGs/Labs/Other Studies Reviewed:    The following studies were reviewed today: CT Chest 05/30/20: FINDINGS: Cardiovascular: Aortic atherosclerosis. Tortuous thoracic aorta. Normal heart size with minimal anterior pericardial thickening. Multivessel coronary artery atherosclerosis. Aortic valve calcification.  Mediastinum/Nodes: No mediastinal or definite hilar adenopathy, given limitations of unenhanced CT.  Lungs/Pleura: No pleural fluid. Moderate centrilobular emphysema. Right greater than left base scarring.  Progressive surrounding  ground-glass interstitial thickening about the posterior left upper lobe and adjacent superior segment left lower lobe. The posterior left upper lobe irregular nodule is less well-defined today. Example 1.2 x 1.1 cm on 88/7 versus 1.6 x 1.4 cm on the prior diagnostic CT.  7 mm left lower lobe pulmonary nodule on 133/7 is similar to 8 mm on the prior.  4-5 mm more inferior posterior left lower lobe pulmonary nodule on 140/7 is unchanged.  2-3 mm left lower lobe pulmonary nodule on 124/7, similar.  The previously treated superior segment left lower lobe pulmonary nodule is no longer readily apparent.  Upper Abdomen: Normal imaged portions of the liver, spleen, stomach, pancreas, gallbladder, adrenal glands, kidneys.  Musculoskeletal: No acute osseous abnormality.  IMPRESSION: 1. Progressive radiation change about the posterior left upper lobe and superior segment left lower lobe. 2. Decreased size of posterior left upper lobe pulmonary nodule. The superior segment left lower lobe treated nodule is no longer readily apparent. 3. No thoracic adenopathy, given limitations of noncontrast exam. 4. Other tiny pulmonary nodules are similar and likely benign. 5. Aortic atherosclerosis (ICD10-I70.0), coronary artery atherosclerosis and emphysema (ICD10-J43.9). 6. Aortic valvular calcifications. Consider echocardiography to evaluate for valvular dysfunction.   EKG:  EKG is *** ordered today.  The ekg ordered today demonstrates ***  Recent Labs: No results found for requested labs within last 8760 hours.  Recent Lipid Panel No results found for: CHOL, TRIG, HDL, CHOLHDL, VLDL, LDLCALC, LDLDIRECT   Risk Assessment/Calculations:   {Does this patient have ATRIAL FIBRILLATION?:[phone]}   Physical Exam:    VS:  There were no vitals taken for this visit.    Wt Readings from Last 3 Encounters:  06/02/20 159 lb 4 oz (72.2 kg)  02/25/20 156 lb (70.8 kg)  12/31/19 156 lb 4  oz (70.9 kg)     GEN: *** Well nourished, well developed in no acute distress HEENT: Normal NECK: No JVD; No carotid bruits LYMPHATICS: No lymphadenopathy CARDIAC: ***RRR, no murmurs, rubs, gallops RESPIRATORY:  Clear to auscultation without rales, wheezing or rhonchi  ABDOMEN: Soft, non-tender, non-distended MUSCULOSKELETAL:  No edema; No deformity  SKIN: Warm and dry NEUROLOGIC:  Alert and oriented x 3 PSYCHIATRIC:  Normal affect   ASSESSMENT:    No diagnosis found. PLAN:    In order of problems listed above:  #Pre-operative Evaluation:  #Multivessel Coronary Calcification on CT chest: -Start ASA 81mg  daily -Start crestor 20mg  daily  #HTN: -Continue lisinopril 10mg  daily  #HLD: -Start crestor 20mg  daily  {Are you ordering a CV Procedure (e.g. stress test, cath, DCCV, TEE, etc)?   Press F2        :[id number]}    Medication Adjustments/Labs and Tests Ordered: Current medicines are reviewed at length with the patient today.  Concerns regarding medicines are outlined above.  No orders of the defined types were placed in this encounter.  No orders of  the defined types were placed in this encounter.   There are no Patient Instructions on file for this visit.   Signed, Freada Bergeron, MD  08/11/2020 6:12 PM    [facility]

## 2020-08-12 ENCOUNTER — Other Ambulatory Visit: Payer: Self-pay

## 2020-08-12 ENCOUNTER — Ambulatory Visit (INDEPENDENT_AMBULATORY_CARE_PROVIDER_SITE_OTHER): Payer: Medicare Other | Admitting: Cardiology

## 2020-08-12 ENCOUNTER — Encounter: Payer: Self-pay | Admitting: Cardiology

## 2020-08-12 VITALS — BP 132/70 | HR 106 | Ht 72.0 in | Wt 156.8 lb

## 2020-08-12 DIAGNOSIS — I4510 Unspecified right bundle-branch block: Secondary | ICD-10-CM

## 2020-08-12 DIAGNOSIS — C641 Malignant neoplasm of right kidney, except renal pelvis: Secondary | ICD-10-CM

## 2020-08-12 DIAGNOSIS — I2510 Atherosclerotic heart disease of native coronary artery without angina pectoris: Secondary | ICD-10-CM

## 2020-08-12 DIAGNOSIS — I10 Essential (primary) hypertension: Secondary | ICD-10-CM

## 2020-08-12 DIAGNOSIS — Z01810 Encounter for preprocedural cardiovascular examination: Secondary | ICD-10-CM | POA: Diagnosis not present

## 2020-08-12 NOTE — Progress Notes (Signed)
Cardiology Office Note:    Date:  08/12/2020   ID:  Dylan Harvey, DOB February 13, 1951, MRN [id number]  PCP:  Crist Infante, MD   [facility] Providers Cardiologist:  None {  Referring MD: Crist Infante, MD    History of Present Illness:    Dylan Harvey is a 70 y.o. male with a hx of coronary calcification on CT chest, HTN, HLD, history of lung cancer s/p XRT followed by Dr. Sondra Come, COPD and tobacco use who was referred by Dr. Joylene Draft for pre-operative evaluation prior to a right nephrectomy for high grade urothelial cell carcinoma.   Today, the patient states that he was diagnosed with urothelial cell carcinoma after presenting with hematuria. CT showed solid and enhancing mass within the right renal pelvis with no evidence of metastatic disease. He underwent ureteroscopy on 06/29/20 with Dr. Gloriann Loan who confirmed the diagnosis of a large urothelial call carcinoma. He presents for pre-operative clearance as detailed above.  From a CV standpoint, the patient is doing very well. He is active and able to achieve >4METs without any exertional symptoms. Specifically, he is able to walk >4mile, climb a flight of stairs and perform all ADLs without issues. No known personal history of CAD.    He denies any chest pain, shortness of breath, palpitations, or exertional symptoms. No headaches, lightheadedness, or syncope to report. Also has no lower extremity edema, orthopnea or PND.  He works at [employer]. He denies any history of heart disease in his family.  Past Medical History:  Diagnosis Date  . Allergy   . Arthritis    thumbs  . Colonic adenoma 2009  . ED (erectile dysfunction)   . Emphysema lung (HCC) MILD   . Gross hematuria   . Hematospermia   . History of colon polyps   . History of radiation therapy 01/18/20-01/25/2020   Left Lung 3 fx; Dr. Gery Pray  . History of radiation therapy 07/24/2018-07/31/2018   SBRT Left lung; Dr Gery Pray  . Hyperlipemia   .  Hypertension   . Left testicular pain   . Lumbago   . Neoplasm of kidney   . Renal cancer ([place])   . Renal cyst   . Right testicular pain   . Tobacco abuse     Past Surgical History:  Procedure Laterality Date  . COLONOSCOPY    . FUDUCIAL PLACEMENT N/A 06/11/2018   Procedure: PLACEMENT OF FUDUCIAL;  Surgeon: Melrose Nakayama, MD;  [facility] OR;  Service: Thoracic;  Laterality: N/A;  . HERNIA REPAIR     inguinal  . INNER EAR SURGERY    . POLYPECTOMY    . VIDEO BRONCHOSCOPY WITH ENDOBRONCHIAL NAVIGATION N/A 06/11/2018   Procedure: VIDEO BRONCHOSCOPY WITH ENDOBRONCHIAL NAVIGATION;  Surgeon: Melrose Nakayama, MD;  Location: [facility] OR;  Service: Thoracic;  Laterality: N/A;  . WISDOM TOOTH EXTRACTION      Current Medications: Current Meds  Medication Sig  . Alfalfa 500 MG TABS Take 2,000-2,500 tablets by mouth See admin instructions. Take 2000 mg by mouth at lunch, 2000 mg at dinner and 2500 mgbefore bed  . b complex vitamins tablet Take 1 tablet by mouth daily.  . cholecalciferol (VITAMIN D) 1000 units tablet Take 1,000 Units by mouth daily.  . fexofenadine (ALLEGRA) 60 MG tablet Take 60 mg by mouth 2 (two) times daily.  . Flaxseed, Linseed, (RA FLAX SEED OIL 1000 PO) Take 1,000 mg by mouth daily.   Nyoka Cowden Tea, Camellia sinensis, (GREEN TEA  PO) Take 500 mg by mouth daily.  . Lecithin 1200 MG CAPS Take 1,200 mg by mouth daily.  Marland Kitchen lisinopril (ZESTRIL) 10 MG tablet Take 10 mg by mouth daily.  . Multiple Vitamin (MULTIVITAMIN WITH MINERALS) TABS Take 1 tablet by mouth daily.  . NON FORMULARY Take 2 capsules by mouth 2 (two) times daily. Relief Factor OTC supplement for generalized muscle aches  . Omega-3 Fatty Acids (FISH OIL) 1200 MG CAPS Take 1,200 mg by mouth daily.  . Probiotic Product (PROBIOTIC PO) Take 1 capsule by mouth daily. 20 billion with 4 strains  . PSYLLIUM HUSK PO Take 4 tablets by mouth 3 (three) times daily. 4 tabs lunch, 4 dinner, 4 one hour before bed  .  sodium chloride (OCEAN) 0.65 % SOLN nasal spray Place 1 spray into the nose daily.  . vitamin C (ASCORBIC ACID) 500 MG tablet Take 500 mg by mouth daily.  Marland Kitchen zinc gluconate 50 MG tablet Take 50 mg by mouth daily.     Allergies:   Neomycin-bacitracin zn-polymyx   Social History   Socioeconomic History  . Marital status: Married    Spouse name: Not on file  . Number of children: Not on file  . Years of education: Not on file  . Highest education level: Not on file  Occupational History  . Not on file  Tobacco Use  . Smoking status: Current Every Day Smoker    Packs/day: 1.00    Types: Cigarettes  . Smokeless tobacco: Never Used  Substance and Sexual Activity  . Alcohol use: Yes    Alcohol/week: 2.0 standard drinks    Types: 2 Shots of liquor per week    Comment: occasionally  . Drug use: No  . Sexual activity: Not on file  Other Topics Concern  . Not on file  Social History Narrative   Married 1980 no chilldren. Went to college   Occupation is Preservationist @ [employer]   Social Determinants of Radio broadcast assistant Strain: Not on file  Food Insecurity: Not on file  Transportation Needs: Not on file  Physical Activity: Not on file  Stress: Not on file  Social Connections: Not on file     Family History: The patient's family history includes Alzheimer's disease in his mother; Hypothyroidism in his mother; Lung cancer in his father; Prostate cancer in his father; Severe combined immunodeficiency in his mother. There is no history of Colon cancer, Colon polyps, Esophageal cancer, Rectal cancer, or Stomach cancer.  ROS:   Please see the history of present illness.    Review of Systems  Constitutional: Negative for diaphoresis, malaise/fatigue and weight loss.  HENT: Negative for ear pain, nosebleeds and sore throat.   Eyes: Negative for double vision and redness.  Respiratory: Negative for shortness of breath and wheezing.    Cardiovascular: Negative for chest pain, palpitations, orthopnea, claudication, leg swelling and PND.  Gastrointestinal: Negative for blood in stool, diarrhea and heartburn.  Genitourinary: Negative for dysuria and hematuria.  Musculoskeletal: Negative for falls, joint pain and myalgias.  Neurological: Negative for tremors, seizures and loss of consciousness.  Endo/Heme/Allergies: Negative for environmental allergies.  Psychiatric/Behavioral: Negative for depression and suicidal ideas. The patient is not nervous/anxious.      EKGs/Labs/Other Studies Reviewed:    The following studies were reviewed today:  CT Chest 05/30/20: FINDINGS: Cardiovascular: Aortic atherosclerosis. Tortuous thoracic aorta. Normal heart size with minimal anterior pericardial thickening. Multivessel coronary artery atherosclerosis. Aortic valve calcification.  Mediastinum/Nodes: No  mediastinal or definite hilar adenopathy, given limitations of unenhanced CT.  Lungs/Pleura: No pleural fluid. Moderate centrilobular emphysema. Right greater than left base scarring.  Progressive surrounding ground-glass interstitial thickening about the posterior left upper lobe and adjacent superior segment left lower lobe. The posterior left upper lobe irregular nodule is less well-defined today. Example 1.2 x 1.1 cm on 88/7 versus 1.6 x 1.4 cm on the prior diagnostic CT.  7 mm left lower lobe pulmonary nodule on 133/7 is similar to 8 mm on the prior.  4-5 mm more inferior posterior left lower lobe pulmonary nodule on 140/7 is unchanged.  2-3 mm left lower lobe pulmonary nodule on 124/7, similar.  The previously treated superior segment left lower lobe pulmonary nodule is no longer readily apparent.  Upper Abdomen: Normal imaged portions of the liver, spleen, stomach, pancreas, gallbladder, adrenal glands, kidneys.  Musculoskeletal: No acute osseous abnormality.  IMPRESSION: 1. Progressive radiation  change about the posterior left upper lobe and superior segment left lower lobe. 2. Decreased size of posterior left upper lobe pulmonary nodule. The superior segment left lower lobe treated nodule is no longer readily apparent. 3. No thoracic adenopathy, given limitations of noncontrast exam. 4. Other tiny pulmonary nodules are similar and likely benign. 5. Aortic atherosclerosis (ICD10-I70.0), coronary artery atherosclerosis and emphysema (ICD10-J43.9). 6. Aortic valvular calcifications. Consider echocardiography to evaluate for valvular dysfunction.   EKG:   08/12/2020: Sinus tachycardia, RBBB. Rate 106 bpm.  Recent Labs: No results found for requested labs within last 8760 hours.  Recent Lipid Panel No results found for: CHOL, TRIG, HDL, CHOLHDL, VLDL, LDLCALC, LDLDIRECT   Risk Assessment/Calculations:       Physical Exam:    VS:  BP 132/70   Pulse (!) 106   Ht 6' (1.829 m)   Wt 156 lb 12.8 oz (71.1 kg)   SpO2 95%   BMI 21.27 kg/m     Wt Readings from Last 3 Encounters:  08/12/20 156 lb 12.8 oz (71.1 kg)  06/02/20 159 lb 4 oz (72.2 kg)  02/25/20 156 lb (70.8 kg)     GEN: Well nourished, well developed in no acute distress HEENT: Normal NECK: No JVD; No carotid bruits LYMPHATICS: No lymphadenopathy CARDIAC: RRR, no murmurs, rubs, gallops RESPIRATORY:  Clear to auscultation without rales, wheezing or rhonchi  ABDOMEN: Soft, non-tender, non-distended MUSCULOSKELETAL:  No edema; No deformity  SKIN: Warm and dry NEUROLOGIC:  Alert and oriented x 3 PSYCHIATRIC:  Normal affect   ASSESSMENT:    1. Preoperative cardiovascular examination   2. Primary hypertension   3. Coronary artery disease involving native coronary artery of native heart without angina pectoris   4. Urothelial carcinoma of kidney, right ([employer])   5. Right bundle branch block    PLAN:    In order of problems listed above:  #Pre-operative Evaluation prior to Right Nephrectomy: Patient is  planned for right nephrectomy following diagnosis of right renal pelvis urothelial carcinoma. No evidence of metastati spread. He is very active and able to perform >4METs without exertional symptoms. Given excellent exercise capacity, no stress testing needed prior to right nephrectomy. -No further testing needed prior to Right Nephrectomy   #Multivessel Coronary Calcification on CT chest: Incidentally noted on CT chest. LDL very low at 54. Patient is asymptomatic. Discussed statin therapy and he wants to discuss with his wife and family further before starting. Will call back next week with his decision. -Patient wants to think about statin therapy -If amenable, would start crestor 5-10mg  or  lipitor 10-20mg  daily -Continue healthy diet and exercise  #HTN: Well controlled and at goal <120s/80s. -Continue lisinopril 10mg  daily -Follow-up with PCP as scheduled  #RBBB: Present in 2020 on ECG as well.     Follow-up as needed.  Medication Adjustments/Labs and Tests Ordered: Current medicines are reviewed at length with the patient today.  Concerns regarding medicines are outlined above.  Orders Placed This Encounter  Procedures  . EKG 12-Lead   No orders of the defined types were placed in this encounter.   Patient Instructions  Medication Instructions:  Your physician recommends that you continue on your current medications as directed. Please refer to the Current Medication list given to you today.  *If you need a refill on your cardiac medications before your next appointment, please call your pharmacy*   Lab Work: None If you have labs (blood work) drawn today and your tests are completely normal, you will receive your results only by: Marland Kitchen MyChart Message (if you have MyChart) OR . A paper copy in the mail If you have any lab test that is abnormal or we need to change your treatment, we will call you to review the results.   Testing/Procedures: None   Follow-Up: At [facility], you and your health needs are our priority.  As part of our continuing mission to provide you with exceptional heart care, we have created designated Provider Care Teams.  These Care Teams include your primary Cardiologist (physician) and Advanced Practice Providers (APPs -  Physician Assistants and Nurse Practitioners) who all work together to provide you with the care you need, when you need it.  We recommend signing up for the patient portal called "MyChart".  Sign up information is provided on this After Visit Summary.  MyChart is used to connect with patients for Virtual Visits (Telemedicine).  Patients are able to view lab/test results, encounter notes, upcoming appointments, etc.  Non-urgent messages can be sent to your provider as well.   To learn more about what you can do with MyChart, go to [email or web address].    Your next appointment:   As needed  The format for your next appointment:   In Person  Provider:   You may see Dr. Johney Frame or one of the following Advanced Practice Providers on your designated Care Team:    Richardson Dopp, PA-C  Robbie Lis, [place]    Other Instructions      I,Mathew Stumpf,acting as a scribe for Freada Bergeron, MD.,have documented all relevant documentation on the behalf of Freada Bergeron, MD,as directed by  Freada Bergeron, MD while in the presence of Freada Bergeron, MD.  I, Freada Bergeron, MD, have reviewed all documentation for this visit. The documentation on 08/12/20 for the exam, diagnosis, procedures, and orders are all accurate and complete.  Signed, Freada Bergeron, MD  08/12/2020 5:27 PM    [facility]

## 2020-08-12 NOTE — Patient Instructions (Signed)
Medication Instructions:  Your physician recommends that you continue on your current medications as directed. Please refer to the Current Medication list given to you today.  *If you need a refill on your cardiac medications before your next appointment, please call your pharmacy*   Lab Work: None If you have labs (blood work) drawn today and your tests are completely normal, you will receive your results only by: Marland Kitchen MyChart Message (if you have MyChart) OR . A paper copy in the mail If you have any lab test that is abnormal or we need to change your treatment, we will call you to review the results.   Testing/Procedures: None   Follow-Up: At [facility], you and your health needs are our priority.  As part of our continuing mission to provide you with exceptional heart care, we have created designated Provider Care Teams.  These Care Teams include your primary Cardiologist (physician) and Advanced Practice Providers (APPs -  Physician Assistants and Nurse Practitioners) who all work together to provide you with the care you need, when you need it.  We recommend signing up for the patient portal called "MyChart".  Sign up information is provided on this After Visit Summary.  MyChart is used to connect with patients for Virtual Visits (Telemedicine).  Patients are able to view lab/test results, encounter notes, upcoming appointments, etc.  Non-urgent messages can be sent to your provider as well.   To learn more about what you can do with MyChart, go to [email or web address].    Your next appointment:   As needed  The format for your next appointment:   In Person  Provider:   You may see Dr. Johney Frame or one of the following Advanced Practice Providers on your designated Care Team:    Richardson Dopp, PA-C  Robbie Lis, [place]    Other Instructions

## 2020-08-22 ENCOUNTER — Other Ambulatory Visit: Payer: Self-pay | Admitting: Urology

## 2020-08-24 DIAGNOSIS — H6241 Otitis externa in other diseases classified elsewhere, right ear: Secondary | ICD-10-CM | POA: Diagnosis not present

## 2020-08-24 DIAGNOSIS — B369 Superficial mycosis, unspecified: Secondary | ICD-10-CM | POA: Diagnosis not present

## 2020-09-02 NOTE — Patient Instructions (Signed)
DUE TO COVID-19 ONLY ONE VISITOR IS ALLOWED TO COME WITH YOU AND STAY IN THE WAITING ROOM ONLY DURING PRE OP AND PROCEDURE DAY OF SURGERY. THE 1 VISITOR  MAY VISIT WITH YOU AFTER SURGERY IN YOUR PRIVATE ROOM DURING VISITING HOURS ONLY!  YOU NEED TO HAVE A COVID 19 TEST ON: 09/05/20 @ 2:40 PM THIS TEST MUST BE DONE BEFORE SURGERY,  COVID TESTING SITE [address] [place] [address], IT IS ON THE RIGHT GOING OUT [address] APPROXIMATELY  2 MINUTES PAST [employer] ON THE RIGHT. ONCE YOUR COVID TEST IS COMPLETED,  PLEASE BEGIN THE QUARANTINE INSTRUCTIONS AS OUTLINED IN YOUR HANDOUT.               Dylan Harvey   Your procedure is scheduled on: 09/07/20   Report to [facility] Main  Entrance   Report to short stay at: 5:15 AM     Call this number if you have problems the morning of surgery [phone]    Remember: Do not eat food or drink liquids :After Midnight.   BRUSH YOUR TEETH MORNING OF SURGERY AND RINSE YOUR MOUTH OUT, NO CHEWING GUM CANDY OR MINTS.    Take these medicines the morning of surgery with A SIP OF WATER: allegra.                               You may not have any metal on your body including hair pins and              piercings  Do not wear jewelry,lotions, powders or perfumes, deodorant             Men may shave face and neck.   Do not bring valuables to the hospital. [place].  Contacts, dentures or bridgework may not be worn into surgery.  Leave suitcase in the car. After surgery it may be brought to your room.     Patients discharged the day of surgery will not be allowed to drive home. IF YOU ARE HAVING SURGERY AND GOING HOME THE SAME DAY, YOU MUST HAVE AN ADULT TO DRIVE YOU HOME AND BE WITH YOU FOR 24 HOURS. YOU MAY GO HOME BY TAXI OR UBER OR ORTHERWISE, BUT AN ADULT MUST ACCOMPANY YOU HOME AND STAY WITH YOU FOR 24 HOURS.  Name and phone number of your driver:  Special  Instructions: N/A              Please read over the following fact sheets you were given: _____________________________________________________________________           [facility] - Preparing for Surgery Before surgery, you can play an important role.  Because skin is not sterile, your skin needs to be as free of germs as possible.  You can reduce the number of germs on your skin by washing with CHG (chlorahexidine gluconate) soap before surgery.  CHG is an antiseptic cleaner which kills germs and bonds with the skin to continue killing germs even after washing. Please DO NOT use if you have an allergy to CHG or antibacterial soaps.  If your skin becomes reddened/irritated stop using the CHG and inform your nurse when you arrive at Short Stay. Do not shave (including legs and underarms) for at least 48 hours prior to the first CHG  shower.  You may shave your face/neck. Please follow these instructions carefully:  1.  Shower with CHG Soap the night before surgery and the  morning of Surgery.  2.  If you choose to wash your hair, wash your hair first as usual with your  normal  shampoo.  3.  After you shampoo, rinse your hair and body thoroughly to remove the  shampoo.                           4.  Use CHG as you would any other liquid soap.  You can apply chg directly  to the skin and wash                       Gently with a scrungie or clean washcloth.  5.  Apply the CHG Soap to your body ONLY FROM THE NECK DOWN.   Do not use on face/ open                           Wound or open sores. Avoid contact with eyes, ears mouth and genitals (private parts).                       Wash face,  Genitals (private parts) with your normal soap.             6.  Wash thoroughly, paying special attention to the area where your surgery  will be performed.  7.  Thoroughly rinse your body with warm water from the neck down.  8.  DO NOT shower/wash with your normal soap after using and rinsing off  the CHG Soap.                 9.  Pat yourself dry with a clean towel.            10.  Wear clean pajamas.            11.  Place clean sheets on your bed the night of your first shower and do not  sleep with pets. Day of Surgery : Do not apply any lotions/deodorants the morning of surgery.  Please wear clean clothes to the hospital/surgery center.  FAILURE TO FOLLOW THESE INSTRUCTIONS MAY RESULT IN THE CANCELLATION OF YOUR SURGERY PATIENT SIGNATURE_________________________________  NURSE SIGNATURE__________________________________  ________________________________________________________________________

## 2020-09-05 ENCOUNTER — Other Ambulatory Visit: Payer: Self-pay

## 2020-09-05 ENCOUNTER — Other Ambulatory Visit (HOSPITAL_COMMUNITY)
Admission: RE | Admit: 2020-09-05 | Discharge: 2020-09-05 | Disposition: A | Discharge status: Home / Self Care | Payer: Medicare Other | Source: Ambulatory Visit | Attending: Urology | Admitting: Urology

## 2020-09-05 ENCOUNTER — Encounter (HOSPITAL_COMMUNITY)
Admission: RE | Admit: 2020-09-05 | Discharge: 2020-09-05 | Disposition: A | Discharge status: Home / Self Care | Payer: Medicare Other | Source: Ambulatory Visit | Attending: Urology | Admitting: Urology

## 2020-09-05 ENCOUNTER — Encounter (HOSPITAL_COMMUNITY): Payer: Self-pay

## 2020-09-05 DIAGNOSIS — Z20822 Contact with and (suspected) exposure to covid-19: Secondary | ICD-10-CM | POA: Insufficient documentation

## 2020-09-05 DIAGNOSIS — Z01812 Encounter for preprocedural laboratory examination: Secondary | ICD-10-CM | POA: Insufficient documentation

## 2020-09-05 LAB — COMPREHENSIVE METABOLIC PANEL
ALT: 15 U/L (ref 0–44)
AST: 21 U/L (ref 15–41)
Albumin: 4.3 g/dL (ref 3.5–5.0)
Alkaline Phosphatase: 66 U/L (ref 38–126)
Anion gap: 8 (ref 5–15)
BUN: 14 mg/dL (ref 8–23)
CO2: 28 mmol/L (ref 22–32)
Calcium: 9.4 mg/dL (ref 8.9–10.3)
Chloride: 96 mmol/L — ABNORMAL LOW (ref 98–111)
Creatinine, Ser: 0.93 mg/dL (ref 0.61–1.24)
GFR, Estimated: >60 mL/min (ref >60)
Glucose, Bld: 132 mg/dL — ABNORMAL HIGH (ref 70–99)
Potassium: 4.3 mmol/L (ref 3.5–5.1)
Sodium: 132 mmol/L — ABNORMAL LOW (ref 135–145)
Total Bilirubin: 0.6 mg/dL (ref 0.3–1.2)
Total Protein: 7.3 g/dL (ref 6.5–8.1)

## 2020-09-05 LAB — CBC
HCT: 36.4 % — ABNORMAL LOW (ref 39.0–52.0)
Hemoglobin: 13.1 g/dL (ref 13.0–17.0)
MCH: 34.7 pg — ABNORMAL HIGH (ref 26.0–34.0)
MCHC: 36 g/dL (ref 30.0–36.0)
MCV: 96.3 fL (ref 80.0–100.0)
Platelets: 192 10*3/uL (ref 150–400)
RBC: 3.78 MIL/uL — ABNORMAL LOW (ref 4.22–5.81)
RDW: 11.8 % (ref 11.5–15.5)
WBC: 7.2 10*3/uL (ref 4.0–10.5)
nRBC: 0 % (ref 0.0–0.2)

## 2020-09-05 LAB — SARS CORONAVIRUS 2 (TAT 6-24 HRS): SARS Coronavirus 2: NEGATIVE

## 2020-09-05 LAB — PROTIME-INR
INR: 1 (ref 0.8–1.2)
Prothrombin Time: 13.2 seconds (ref 11.4–15.2)

## 2020-09-05 NOTE — Progress Notes (Signed)
COVID Vaccine Completed: Yes Date COVID Vaccine completed: 06/10/19 COVID vaccine manufacturer: Pfizer      PCP - Dr. Crist Infante Cardiologist - Dr. Gwyndolyn Kaufman. LOV: 08/12/20: Clearance: 08/12/20: EPIC  Chest x-ray - 05/30/20 EKG - 08/12/20 EPIC Stress Test -  ECHO -  Cardiac Cath -  Pacemaker/ICD device last checked:  Sleep Study -  CPAP -   Fasting Blood Sugar -  Checks Blood Sugar _____ times a day  Blood Thinner Instructions: Aspirin Instructions: Last Dose:  Anesthesia review: Hx: COPD,Rt. BBB,HTN,Smoker.  Patient denies shortness of breath, fever, cough and chest pain at PAT appointment   Patient verbalized understanding of instructions that were given to them at the PAT appointment. Patient was also instructed that they will need to review over the PAT instructions again at home before surgery.

## 2020-09-05 NOTE — Anesthesia Preprocedure Evaluation (Addendum)
Anesthesia Evaluation  Patient identified by MRN, date of birth, ID band Patient awake    Reviewed: Allergy & Precautions, NPO status , Patient's Chart, lab work & pertinent test results  Airway Mallampati: II  TM Distance: >3 FB Neck ROM: Full    Dental no notable dental hx.    Pulmonary COPD, Current Smoker and Patient abstained from smoking.,    Pulmonary exam normal breath sounds clear to auscultation       Cardiovascular hypertension, Pt. on medications negative cardio ROS Normal cardiovascular exam Rhythm:Regular Rate:Normal     Neuro/Psych negative neurological ROS  negative psych ROS   GI/Hepatic negative GI ROS, Neg liver ROS,   Endo/Other  negative endocrine ROS  Renal/GU Renal disease  negative genitourinary   Musculoskeletal  (+) Arthritis , Osteoarthritis,    Abdominal   Peds negative pediatric ROS (+)  Hematology negative hematology ROS (+)   Anesthesia Other Findings   Reproductive/Obstetrics negative OB ROS                            Anesthesia Physical Anesthesia Plan  ASA: 3  Anesthesia Plan: General   Post-op Pain Management:    Induction: Intravenous  PONV Risk Score and Plan: 1 and Ondansetron and Treatment may vary due to age or medical condition  Airway Management Planned: Oral ETT  Additional Equipment:   Intra-op Plan:   Post-operative Plan: Extubation in OR  Informed Consent: I have reviewed the patients History and Physical, chart, labs and discussed the procedure including the risks, benefits and alternatives for the proposed anesthesia with the patient or authorized representative who has indicated his/her understanding and acceptance.     Dental advisory given  Plan Discussed with: CRNA  Anesthesia Plan Comments: (Per cardiology note, "Patient is planned for right nephrectomy following diagnosis of right renal pelvis urothelial carcinoma.  No evidence of metastati spread. He is very active and able to perform >4METs without exertional symptoms. Given excellent exercise capacity, no stress testing needed prior to right nephrectomy. -No further testing needed prior to Right Nephrectomy")       Anesthesia Quick Evaluation

## 2020-09-07 ENCOUNTER — Inpatient Hospital Stay (HOSPITAL_COMMUNITY)
Admission: RE | Admit: 2020-09-07 | Discharge: 2020-09-11 | DRG: 657 | Disposition: A | Discharge status: Home / Self Care | Payer: Medicare Other | Attending: Urology | Admitting: Urology

## 2020-09-07 ENCOUNTER — Encounter (HOSPITAL_COMMUNITY)
Admission: RE | Disposition: A | Discharge status: Home / Self Care | Payer: Self-pay | Source: Ambulatory Visit | Attending: Urology

## 2020-09-07 ENCOUNTER — Inpatient Hospital Stay (HOSPITAL_COMMUNITY): Payer: Medicare Other | Admitting: Anesthesiology

## 2020-09-07 ENCOUNTER — Encounter (HOSPITAL_COMMUNITY): Payer: Self-pay | Admitting: Urology

## 2020-09-07 ENCOUNTER — Inpatient Hospital Stay (HOSPITAL_COMMUNITY): Payer: Medicare Other | Admitting: Physician Assistant

## 2020-09-07 ENCOUNTER — Other Ambulatory Visit: Payer: Self-pay

## 2020-09-07 DIAGNOSIS — K567 Ileus, unspecified: Secondary | ICD-10-CM | POA: Diagnosis not present

## 2020-09-07 DIAGNOSIS — M47816 Spondylosis without myelopathy or radiculopathy, lumbar region: Secondary | ICD-10-CM | POA: Diagnosis not present

## 2020-09-07 DIAGNOSIS — Z20822 Contact with and (suspected) exposure to covid-19: Secondary | ICD-10-CM | POA: Diagnosis present

## 2020-09-07 DIAGNOSIS — F17210 Nicotine dependence, cigarettes, uncomplicated: Secondary | ICD-10-CM | POA: Diagnosis present

## 2020-09-07 DIAGNOSIS — Z86010 Personal history of colonic polyps: Secondary | ICD-10-CM | POA: Diagnosis not present

## 2020-09-07 DIAGNOSIS — Z8042 Family history of malignant neoplasm of prostate: Secondary | ICD-10-CM

## 2020-09-07 DIAGNOSIS — J439 Emphysema, unspecified: Secondary | ICD-10-CM | POA: Diagnosis not present

## 2020-09-07 DIAGNOSIS — K9189 Other postprocedural complications and disorders of digestive system: Secondary | ICD-10-CM | POA: Diagnosis not present

## 2020-09-07 DIAGNOSIS — I10 Essential (primary) hypertension: Secondary | ICD-10-CM | POA: Diagnosis present

## 2020-09-07 DIAGNOSIS — C651 Malignant neoplasm of right renal pelvis: Secondary | ICD-10-CM | POA: Diagnosis present

## 2020-09-07 DIAGNOSIS — Z8719 Personal history of other diseases of the digestive system: Secondary | ICD-10-CM

## 2020-09-07 DIAGNOSIS — N529 Male erectile dysfunction, unspecified: Secondary | ICD-10-CM | POA: Diagnosis present

## 2020-09-07 DIAGNOSIS — K219 Gastro-esophageal reflux disease without esophagitis: Secondary | ICD-10-CM | POA: Diagnosis present

## 2020-09-07 DIAGNOSIS — N281 Cyst of kidney, acquired: Secondary | ICD-10-CM | POA: Diagnosis present

## 2020-09-07 DIAGNOSIS — J449 Chronic obstructive pulmonary disease, unspecified: Secondary | ICD-10-CM | POA: Diagnosis not present

## 2020-09-07 DIAGNOSIS — Z888 Allergy status to other drugs, medicaments and biological substances status: Secondary | ICD-10-CM

## 2020-09-07 DIAGNOSIS — E785 Hyperlipidemia, unspecified: Secondary | ICD-10-CM | POA: Diagnosis not present

## 2020-09-07 DIAGNOSIS — R059 Cough, unspecified: Secondary | ICD-10-CM | POA: Diagnosis not present

## 2020-09-07 DIAGNOSIS — I700 Atherosclerosis of aorta: Secondary | ICD-10-CM | POA: Diagnosis present

## 2020-09-07 DIAGNOSIS — Z923 Personal history of irradiation: Secondary | ICD-10-CM | POA: Diagnosis not present

## 2020-09-07 DIAGNOSIS — N2889 Other specified disorders of kidney and ureter: Secondary | ICD-10-CM | POA: Diagnosis present

## 2020-09-07 DIAGNOSIS — C61 Malignant neoplasm of prostate: Secondary | ICD-10-CM | POA: Diagnosis not present

## 2020-09-07 HISTORY — PX: ROBOT ASSITED LAPAROSCOPIC NEPHROURETERECTOMY: SHX6077

## 2020-09-07 LAB — HEMOGLOBIN AND HEMATOCRIT, BLOOD
HCT: 38.4 % — ABNORMAL LOW (ref 39.0–52.0)
Hemoglobin: 13.4 g/dL (ref 13.0–17.0)

## 2020-09-07 LAB — TYPE AND SCREEN
ABO/RH(D): O POS
Antibody Screen: NEGATIVE

## 2020-09-07 SURGERY — NEPHROURETERECTOMY, ROBOT-ASSISTED, LAPAROSCOPIC
Anesthesia: General | Laterality: Right

## 2020-09-07 MED ORDER — CEFAZOLIN SODIUM-DEXTROSE 2-4 GM/100ML-% IV SOLN
2.0000 g | Freq: Once | INTRAVENOUS | Status: AC
  Administered 2020-09-07: 2 g via INTRAVENOUS
  Filled 2020-09-07: qty 100

## 2020-09-07 MED ORDER — ACETAMINOPHEN 10 MG/ML IV SOLN
1000.0000 mg | Freq: Four times a day (QID) | INTRAVENOUS | Status: AC
  Administered 2020-09-07 – 2020-09-08 (×4): 1000 mg via INTRAVENOUS
  Filled 2020-09-07 (×4): qty 100

## 2020-09-07 MED ORDER — KETAMINE HCL 10 MG/ML IJ SOLN
INTRAMUSCULAR | Status: AC
  Filled 2020-09-07: qty 1

## 2020-09-07 MED ORDER — SUGAMMADEX SODIUM 200 MG/2ML IV SOLN
INTRAVENOUS | Status: DC | PRN
  Administered 2020-09-07: 200 mg via INTRAVENOUS

## 2020-09-07 MED ORDER — HYDRALAZINE HCL 20 MG/ML IJ SOLN
10.0000 mg | INTRAMUSCULAR | Status: DC | PRN
  Administered 2020-09-09 – 2020-09-10 (×2): 10 mg via INTRAVENOUS
  Filled 2020-09-07 (×3): qty 1

## 2020-09-07 MED ORDER — ONDANSETRON HCL 4 MG/2ML IJ SOLN
INTRAMUSCULAR | Status: AC
  Filled 2020-09-07: qty 2

## 2020-09-07 MED ORDER — LIDOCAINE HCL 2 % IJ SOLN
INTRAMUSCULAR | Status: AC
  Filled 2020-09-07: qty 20

## 2020-09-07 MED ORDER — ORAL CARE MOUTH RINSE
15.0000 mL | Freq: Two times a day (BID) | OROMUCOSAL | Status: DC
  Administered 2020-09-07 – 2020-09-11 (×6): 15 mL via OROMUCOSAL

## 2020-09-07 MED ORDER — HYDRALAZINE HCL 20 MG/ML IJ SOLN
INTRAMUSCULAR | Status: DC | PRN
  Administered 2020-09-07: 5 mg via INTRAVENOUS

## 2020-09-07 MED ORDER — SODIUM CHLORIDE 0.9 % IR SOLN
Status: DC | PRN
  Administered 2020-09-07: 3000 mL via INTRAVESICAL

## 2020-09-07 MED ORDER — ROCURONIUM BROMIDE 10 MG/ML (PF) SYRINGE
PREFILLED_SYRINGE | INTRAVENOUS | Status: AC
  Filled 2020-09-07: qty 10

## 2020-09-07 MED ORDER — CHLORHEXIDINE GLUCONATE CLOTH 2 % EX PADS
6.0000 | MEDICATED_PAD | Freq: Every day | CUTANEOUS | Status: DC
  Administered 2020-09-08 – 2020-09-11 (×4): 6 via TOPICAL

## 2020-09-07 MED ORDER — PROMETHAZINE HCL 12.5 MG PO TABS: 12.5000 mg | ORAL_TABLET | ORAL | 0 refills | Status: DC | PRN

## 2020-09-07 MED ORDER — AMISULPRIDE (ANTIEMETIC) 5 MG/2ML IV SOLN: 10.0000 mg | Freq: Once | INTRAVENOUS | Status: DC | PRN

## 2020-09-07 MED ORDER — KETAMINE HCL 10 MG/ML IJ SOLN
INTRAMUSCULAR | Status: DC | PRN
  Administered 2020-09-07: 40 mg via INTRAVENOUS
  Administered 2020-09-07: 30 mg via INTRAVENOUS

## 2020-09-07 MED ORDER — ONDANSETRON HCL 4 MG/2ML IJ SOLN
INTRAMUSCULAR | Status: DC | PRN
  Administered 2020-09-07: 4 mg via INTRAVENOUS

## 2020-09-07 MED ORDER — DIPHENHYDRAMINE HCL 50 MG/ML IJ SOLN: 12.5000 mg | Freq: Four times a day (QID) | INTRAMUSCULAR | Status: DC | PRN

## 2020-09-07 MED ORDER — ONDANSETRON HCL 4 MG/2ML IJ SOLN
4.0000 mg | INTRAMUSCULAR | Status: DC | PRN
  Administered 2020-09-07 – 2020-09-09 (×5): 4 mg via INTRAVENOUS
  Filled 2020-09-07 (×5): qty 2

## 2020-09-07 MED ORDER — PHENYLEPHRINE 40 MCG/ML (10ML) SYRINGE FOR IV PUSH (FOR BLOOD PRESSURE SUPPORT)
PREFILLED_SYRINGE | INTRAVENOUS | Status: DC | PRN
  Administered 2020-09-07: 80 ug via INTRAVENOUS

## 2020-09-07 MED ORDER — PROPOFOL 10 MG/ML IV BOLUS
INTRAVENOUS | Status: DC | PRN
  Administered 2020-09-07: 150 mg via INTRAVENOUS

## 2020-09-07 MED ORDER — BELLADONNA ALKALOIDS-OPIUM 16.2-60 MG RE SUPP
1.0000 | Freq: Four times a day (QID) | RECTAL | Status: DC | PRN
  Administered 2020-09-08: 1 via RECTAL
  Filled 2020-09-07: qty 1

## 2020-09-07 MED ORDER — LIDOCAINE 2% (20 MG/ML) 5 ML SYRINGE
INTRAMUSCULAR | Status: DC | PRN
  Administered 2020-09-07: 100 mg via INTRAVENOUS

## 2020-09-07 MED ORDER — DOCUSATE SODIUM 100 MG PO CAPS: 100.0000 mg | ORAL_CAPSULE | Freq: Two times a day (BID) | ORAL | Status: DC

## 2020-09-07 MED ORDER — CHLORHEXIDINE GLUCONATE 0.12 % MT SOLN
15.0000 mL | Freq: Once | OROMUCOSAL | Status: AC
  Administered 2020-09-07: 15 mL via OROMUCOSAL

## 2020-09-07 MED ORDER — SODIUM CHLORIDE (PF) 0.9 % IJ SOLN
INTRAMUSCULAR | Status: AC
  Filled 2020-09-07: qty 20

## 2020-09-07 MED ORDER — ROCURONIUM BROMIDE 10 MG/ML (PF) SYRINGE
PREFILLED_SYRINGE | INTRAVENOUS | Status: DC | PRN
  Administered 2020-09-07 (×3): 10 mg via INTRAVENOUS
  Administered 2020-09-07: 70 mg via INTRAVENOUS

## 2020-09-07 MED ORDER — PROPOFOL 10 MG/ML IV BOLUS
INTRAVENOUS | Status: AC
  Filled 2020-09-07: qty 20

## 2020-09-07 MED ORDER — OXYCODONE HCL 5 MG/5ML PO SOLN: 5.0000 mg | Freq: Once | ORAL | Status: DC | PRN

## 2020-09-07 MED ORDER — MIDAZOLAM HCL 5 MG/5ML IJ SOLN
INTRAMUSCULAR | Status: DC | PRN
  Administered 2020-09-07: 2 mg via INTRAVENOUS

## 2020-09-07 MED ORDER — HYDROCODONE-ACETAMINOPHEN 5-325 MG PO TABS: 1.0000 | ORAL_TABLET | Freq: Four times a day (QID) | ORAL | 0 refills | Status: DC | PRN

## 2020-09-07 MED ORDER — FENTANYL CITRATE (PF) 100 MCG/2ML IJ SOLN
INTRAMUSCULAR | Status: AC
  Filled 2020-09-07: qty 2

## 2020-09-07 MED ORDER — STERILE WATER FOR IRRIGATION IR SOLN
Status: DC | PRN
  Administered 2020-09-07: 1000 mL

## 2020-09-07 MED ORDER — HYDROMORPHONE HCL 1 MG/ML IJ SOLN
INTRAMUSCULAR | Status: AC
  Filled 2020-09-07: qty 1

## 2020-09-07 MED ORDER — SENNOSIDES-DOCUSATE SODIUM 8.6-50 MG PO TABS
2.0000 | ORAL_TABLET | Freq: Every day | ORAL | Status: DC
  Administered 2020-09-07 – 2020-09-08 (×2): 2 via ORAL
  Filled 2020-09-07 (×3): qty 2

## 2020-09-07 MED ORDER — LACTATED RINGERS IR SOLN
Status: DC | PRN
  Administered 2020-09-07: 1000 mL

## 2020-09-07 MED ORDER — FENTANYL CITRATE (PF) 100 MCG/2ML IJ SOLN
INTRAMUSCULAR | Status: DC | PRN
  Administered 2020-09-07: 50 ug via INTRAVENOUS
  Administered 2020-09-07: 100 ug via INTRAVENOUS
  Administered 2020-09-07: 50 ug via INTRAVENOUS

## 2020-09-07 MED ORDER — OXYCODONE HCL 5 MG PO TABS
5.0000 mg | ORAL_TABLET | ORAL | Status: DC | PRN
  Administered 2020-09-08 – 2020-09-11 (×4): 5 mg via ORAL
  Filled 2020-09-07 (×5): qty 1

## 2020-09-07 MED ORDER — DEXTROSE-NACL 5-0.45 % IV SOLN: INTRAVENOUS | Status: DC

## 2020-09-07 MED ORDER — DEXAMETHASONE SODIUM PHOSPHATE 10 MG/ML IJ SOLN
INTRAMUSCULAR | Status: DC | PRN
  Administered 2020-09-07: 10 mg via INTRAVENOUS

## 2020-09-07 MED ORDER — SODIUM CHLORIDE (PF) 0.9 % IJ SOLN
INTRAMUSCULAR | Status: DC | PRN
  Administered 2020-09-07: 20 mL

## 2020-09-07 MED ORDER — HYDROMORPHONE HCL 1 MG/ML IJ SOLN
0.2500 mg | INTRAMUSCULAR | Status: DC | PRN
  Administered 2020-09-07 (×3): 0.5 mg via INTRAVENOUS

## 2020-09-07 MED ORDER — LISINOPRIL 10 MG PO TABS
10.0000 mg | ORAL_TABLET | Freq: Every day | ORAL | Status: DC
  Administered 2020-09-08 – 2020-09-11 (×4): 10 mg via ORAL
  Filled 2020-09-07 (×5): qty 1

## 2020-09-07 MED ORDER — HYDRALAZINE HCL 20 MG/ML IJ SOLN
INTRAMUSCULAR | Status: AC
  Filled 2020-09-07: qty 1

## 2020-09-07 MED ORDER — CEFAZOLIN SODIUM-DEXTROSE 1-4 GM/50ML-% IV SOLN
1.0000 g | Freq: Three times a day (TID) | INTRAVENOUS | Status: AC
  Administered 2020-09-07 (×2): 1 g via INTRAVENOUS
  Filled 2020-09-07 (×2): qty 50

## 2020-09-07 MED ORDER — DEXAMETHASONE SODIUM PHOSPHATE 10 MG/ML IJ SOLN
INTRAMUSCULAR | Status: AC
  Filled 2020-09-07: qty 1

## 2020-09-07 MED ORDER — ORAL CARE MOUTH RINSE: 15.0000 mL | Freq: Once | OROMUCOSAL | Status: AC

## 2020-09-07 MED ORDER — LACTATED RINGERS IV SOLN: INTRAVENOUS | Status: DC

## 2020-09-07 MED ORDER — OXYCODONE HCL 5 MG PO TABS
5.0000 mg | ORAL_TABLET | Freq: Once | ORAL | Status: DC | PRN
Start: 2020-09-07 — End: 2020-09-07

## 2020-09-07 MED ORDER — BUPIVACAINE LIPOSOME 1.3 % IJ SUSP
20.0000 mL | Freq: Once | INTRAMUSCULAR | Status: AC
  Administered 2020-09-07: 20 mL
  Filled 2020-09-07: qty 20

## 2020-09-07 MED ORDER — DIPHENHYDRAMINE HCL 12.5 MG/5ML PO ELIX: 12.5000 mg | ORAL_SOLUTION | Freq: Four times a day (QID) | ORAL | Status: DC | PRN

## 2020-09-07 MED ORDER — HYDROMORPHONE HCL 1 MG/ML IJ SOLN
0.5000 mg | INTRAMUSCULAR | Status: DC | PRN
  Administered 2020-09-07 – 2020-09-09 (×5): 1 mg via INTRAVENOUS
  Filled 2020-09-07 (×6): qty 1

## 2020-09-07 MED ORDER — LIDOCAINE 2% (20 MG/ML) 5 ML SYRINGE
INTRAMUSCULAR | Status: AC
  Filled 2020-09-07: qty 5

## 2020-09-07 MED ORDER — MIDAZOLAM HCL 2 MG/2ML IJ SOLN
INTRAMUSCULAR | Status: AC
  Filled 2020-09-07: qty 2

## 2020-09-07 MED ORDER — PROMETHAZINE HCL 25 MG/ML IJ SOLN: 6.2500 mg | INTRAMUSCULAR | Status: DC | PRN

## 2020-09-07 SURGICAL SUPPLY — 64 items
BAG LAPAROSCOPIC 12 15 PORT 16 (BASKET) ×1 IMPLANT
BAG RETRIEVAL 12/15 (BASKET) ×2
CATH 18FR 3 WAY 30 (CATHETERS) ×2
CATH 18FR 3WAY 30CC (CATHETERS) ×1 IMPLANT
CHLORAPREP W/TINT 26 (MISCELLANEOUS) ×2 IMPLANT
CLIP VESOLOCK LG 6/CT PURPLE (CLIP) ×2 IMPLANT
CLIP VESOLOCK MED LG 6/CT (CLIP) ×2 IMPLANT
CLIP VESOLOCK XL 6/CT (CLIP) ×2 IMPLANT
COVER SURGICAL LIGHT HANDLE (MISCELLANEOUS) ×2 IMPLANT
COVER TIP SHEARS 8 DVNC (MISCELLANEOUS) ×1 IMPLANT
COVER TIP SHEARS 8MM DA VINCI (MISCELLANEOUS) ×2
COVER WAND RF STERILE (DRAPES) IMPLANT
CUTTER ECHEON FLEX ENDO 45 340 (ENDOMECHANICALS) ×2 IMPLANT
DECANTER SPIKE VIAL GLASS SM (MISCELLANEOUS) ×2 IMPLANT
DERMABOND ADVANCED (GAUZE/BANDAGES/DRESSINGS) ×1
DERMABOND ADVANCED .7 DNX12 (GAUZE/BANDAGES/DRESSINGS) ×1 IMPLANT
DRAIN CHANNEL 15F RND FF 3/16 (WOUND CARE) ×2 IMPLANT
DRAPE ARM DVNC X/XI (DISPOSABLE) ×4 IMPLANT
DRAPE COLUMN DVNC XI (DISPOSABLE) ×1 IMPLANT
DRAPE DA VINCI XI ARM (DISPOSABLE) ×8
DRAPE DA VINCI XI COLUMN (DISPOSABLE) ×2
DRAPE INCISE IOBAN 66X45 STRL (DRAPES) ×2 IMPLANT
DRAPE LAPAROSCOPIC ABDOMINAL (DRAPES) ×2 IMPLANT
DRAPE SHEET LG 3/4 BI-LAMINATE (DRAPES) ×2 IMPLANT
DRSG TEGADERM 4X4.75 (GAUZE/BANDAGES/DRESSINGS) ×2 IMPLANT
ELECT REM PT RETURN 15FT ADLT (MISCELLANEOUS) ×2 IMPLANT
EVACUATOR SILICONE 100CC (DRAIN) ×2 IMPLANT
GAUZE SPONGE 4X4 12PLY STRL (GAUZE/BANDAGES/DRESSINGS) ×2 IMPLANT
GLOVE SURG ENC MOIS LTX SZ6.5 (GLOVE) ×2 IMPLANT
GLOVE SURG ENC TEXT LTX SZ7.5 (GLOVE) ×4 IMPLANT
GOWN STRL REUS W/TWL LRG LVL3 (GOWN DISPOSABLE) ×6 IMPLANT
IRRIG SUCT STRYKERFLOW 2 WTIP (MISCELLANEOUS)
IRRIGATION SUCT STRKRFLW 2 WTP (MISCELLANEOUS) IMPLANT
KIT BASIN OR (CUSTOM PROCEDURE TRAY) ×2 IMPLANT
KIT TURNOVER KIT A (KITS) ×2 IMPLANT
NEEDLE INSUFFLATION 14GA 120MM (NEEDLE) ×2 IMPLANT
NS IRRIG 1000ML POUR BTL (IV SOLUTION) ×2 IMPLANT
PROTECTOR NERVE ULNAR (MISCELLANEOUS) ×4 IMPLANT
SCISSORS LAP 5X45 EPIX DISP (ENDOMECHANICALS) ×2 IMPLANT
SEAL CANN UNIV 5-8 DVNC XI (MISCELLANEOUS) ×4 IMPLANT
SEAL XI 5MM-8MM UNIVERSAL (MISCELLANEOUS) ×8
SET TUBE SMOKE EVAC HIGH FLOW (TUBING) ×2 IMPLANT
SOLUTION ELECTROLUBE (MISCELLANEOUS) ×2 IMPLANT
STAPLE RELOAD 45 WHT (STAPLE) ×2 IMPLANT
STAPLE RELOAD 45MM WHITE (STAPLE) ×4
STENT URET 6FRX24 CONTOUR (STENTS) ×2 IMPLANT
SUT ETHILON 3 0 PS 1 (SUTURE) ×2 IMPLANT
SUT MNCRL AB 4-0 PS2 18 (SUTURE) ×4 IMPLANT
SUT PDS AB 1 CTX 36 (SUTURE) ×4 IMPLANT
SUT V-LOC BARB 180 2/0GR6 GS22 (SUTURE)
SUT VIC AB 2-0 SH 27 (SUTURE) ×2
SUT VIC AB 2-0 SH 27X BRD (SUTURE) ×1 IMPLANT
SUT VICRYL 0 UR6 27IN ABS (SUTURE) ×6 IMPLANT
SUT VLOC BARB 180 ABS3/0GR12 (SUTURE) ×4
SUTURE V-LC BRB 180 2/0GR6GS22 (SUTURE) IMPLANT
SUTURE VLOC BRB 180 ABS3/0GR12 (SUTURE) ×2 IMPLANT
SYR 30ML LL (SYRINGE) ×2 IMPLANT
TOWEL OR NON WOVEN STRL DISP B (DISPOSABLE) ×2 IMPLANT
TRAY FOLEY MTR SLVR 16FR STAT (SET/KITS/TRAYS/PACK) ×2 IMPLANT
TRAY LAPAROSCOPIC (CUSTOM PROCEDURE TRAY) ×2 IMPLANT
TROCAR BLADELESS OPT 5 100 (ENDOMECHANICALS) ×2 IMPLANT
TROCAR UNIVERSAL OPT 12M 100M (ENDOMECHANICALS) IMPLANT
TROCAR XCEL 12X100 BLDLESS (ENDOMECHANICALS) ×2 IMPLANT
WATER STERILE IRR 1000ML POUR (IV SOLUTION) ×4 IMPLANT

## 2020-09-07 NOTE — Anesthesia Postprocedure Evaluation (Signed)
Anesthesia Post Note  Patient: Dylan Harvey  Procedure(s) Performed: XI ROBOT ASSITED LAPAROSCOPIC NEPHROURETERECTOMY/ CYSTOSCOPY/ STENT PLACEMENT/ TRANSURETHRAL RESECTION OF URETERAL ORIFICE (Right)     Patient location during evaluation: PACU Anesthesia Type: General Level of consciousness: awake and alert Pain management: pain level controlled Vital Signs Assessment: post-procedure vital signs reviewed and stable Respiratory status: spontaneous breathing, nonlabored ventilation and respiratory function stable Cardiovascular status: blood pressure returned to baseline and stable Postop Assessment: no apparent nausea or vomiting Anesthetic complications: no   No notable events documented.  Last Vitals:  Vitals:   09/07/20 1200 09/07/20 1215  BP: (!) 154/76 (!) 155/74  Pulse: 87 88  Resp: 17 14  Temp: (!) 36.4 C (!) 36.4 C  SpO2: 98% 98%    Last Pain:  Vitals:   09/07/20 1215  TempSrc:   PainSc: Asleep                 Lynda Rainwater

## 2020-09-07 NOTE — Op Note (Signed)
Operative Note  Preoperative diagnosis:  1.  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  Same  Procedure(s): 1.  Cystoscopy with right ureteral stent placement and TUR of the right ureteral orifice 2.  Robot-assisted laparoscopic right nephro ureterectomy  Surgeon: Ellison Hughs, MD  Assistants: Debbrah Alar, PA-C  An assistant was required for this surgical procedure.  The duties of the assistant included but were not limited to suctioning, passing suture, camera manipulation, retraction.  This procedure would not be able to be performed without an Environmental consultant.   Anesthesia:  General  Complications:  None  EBL: 50 mL  Specimens: 1.  Right kidney and ureter  Drains/Catheters: 1.  78 French three-way Foley catheter with 10 mL of sterile water in the balloon 2.  19 French JP drain  Intraoperative findings:   Watertight closure of the right ureteral hiatus  Indication:  Dylan Harvey is a 70 y.o. male with high-grade urothelial cell carcinoma involving the right renal pelvis and right ureter.  He has been consented for the above procedures, voices understanding and wishes to proceed.  Description of procedure:  After informed consent was obtained, the patient was brought to the operating room and general endotracheal anesthesia was administered.  The patient was placed in the dorsal lithotomy was position and prepped and draped in usual sterile fashion.  A 23 French rigid cystoscope was then advanced into the urethra and up into the bladder.  A complete bladder survey revealed no intravesical lesions.  A sensor wire was then advanced up the right ureter and a 6 [place], 24 cm JJ stent was then advanced over the wire and into good position within the right ureter.  The rigid cystoscope was then exchanged for a 26 [place] resectoscope with a hot [employer] working Actuary.  The right ureteral orifice was then  resected down to the perivesical fat.  An 26 French Foley catheter was then placed with 10 mL of sterile water in the balloon and placed to gravity drainage.  The patient was then placed in the left lateral decubitus position with all pressure points padded. The abdomen was then prepped and draped in the usual sterile fashion.  A time-out was then performed.     An 8 mm incision was then made lateral to the right rectus muscle at the level of the right 12th rib.  A Veress needle was then used to access the abdominal cavity.  A saline drop test showed no signs of obstruction and aspiration of the Veress needle revealed no blood or sucus.  The abdominal cavity was then insufflated to 15 mmHg.  An 8 mm robotic trocar was then atraumatically inserted into the abdominal cavity.  The robotic camera was then inserted through the port and inspection of the abdominal cavity revealed no evidence of adjacent organ or vessel injury. We then placed three additional 8 mm robotic ports ans a 12 mm assistant port in such as fashion as to triangulate the right renal hilum.  The robot was then docked into postion.   Using a combination of blunt and cold scissors dissection, the hepatic attachments were released from the abdominal sidewall.  The white line of Toldt along the ascending colon was then incised, allowing [place] to reflect the colon medially and expose the anterior surface of the right kidney.  The duodenum was then Kocherized medially, which abruptly led [place] to the identification of the inferior vena cava.  Once the colon was adequately mobilized, we moved to the lower pole and identified the gonadal vein and ureter.  The gonadal vein was then left running parallel to the vena cava and the right ureter was reflected anteriorly.  Using cautious cautery, the overlying perihilar attachments were then released.  This yielded visualization of the renal hilum, which included a single right renal vein and a single right  renal artery.  The perilymphatic tissue surrounding the right renal artery were carefully released so that the right renal artery was fully encircled.    A 45 mm powered endovascular stapler was then used to ligate the right renal artery and vein, separately.  The right hilar stump was hemostatic following staple ligation.  The remaining perinephric attachments were then incised using electrocautery. Reinspection of the right retroperitoneal space revealed excellent hemostasis.   The right ureter was then traced down to the bladder hiatus and extracted, removing the distal aspects of the ureter and stent intact.  The bladder hiatus was then closed using a 3-0 V lock suture, achieving a watertight closure.  Once the right kidney was fully mobile, it was placed in an Endo Catch bag and left in the abdominal cavity.  A 19 [place] JP drain was then placed through the the midline robotic port and left in place in the dependent portion of the pelvis.   The 12 mm midline assistant port was then closed using the [employer] technique with a 0 Vicryl suture.  A right lower quadrant Gibson incision was then made in the right kidney was removed within the Endo Catch bag.  The fascia of the external and internal oblique were then closed with a running 0 PDS suture.  The skin incisions were then closed using 4-0 Monocryl.  Dermabond was applied to all skin incisions.   Plan: Monitor on the floor overnight

## 2020-09-07 NOTE — Progress Notes (Signed)
Dr. Lovena Neighbours notified of patient's blood pressure in the 160s-170s x2 readings. Given verbal orders 10mg  hydralazine IV q4 PRN for SBP > 160.

## 2020-09-07 NOTE — Transfer of Care (Signed)
Immediate Anesthesia Transfer of Care Note  Patient: Dylan Harvey  Procedure(s) Performed: Procedure(s): XI ROBOT ASSITED LAPAROSCOPIC NEPHROURETERECTOMY/ CYSTOSCOPY/ STENT PLACEMENT/ TRANSURETHRAL RESECTION OF URETERAL ORIFICE (Right)  Patient Location: PACU  Anesthesia Type:General  Level of Consciousness: Alert, Awake, Oriented  Airway & Oxygen Therapy: Patient Spontanous Breathing  Post-op Assessment: Report given to RN  Post vital signs: Reviewed and stable  Last Vitals:  Vitals:   09/07/20 0528  BP: (!) 158/91  Pulse: 88  Resp: 16  Temp: 36.7 C  SpO2: 87%    Complications: No apparent anesthesia complications

## 2020-09-07 NOTE — Anesthesia Procedure Notes (Signed)
Procedure Name: Intubation Date/Time: 09/07/2020 7:38 AM Performed by: Gerald Leitz, CRNA Pre-anesthesia Checklist: Patient identified, Patient being monitored, Timeout performed, Emergency Drugs available and Suction available Patient Re-evaluated:Patient Re-evaluated prior to induction Oxygen Delivery Method: Circle system utilized Preoxygenation: Pre-oxygenation with 100% oxygen Induction Type: IV induction Ventilation: Mask ventilation without difficulty Laryngoscope Size: Mac and 3 Grade View: Grade I Tube type: Oral Tube size: 7.5 mm Number of attempts: 1 Airway Equipment and Method: Stylet Placement Confirmation: ETT inserted through vocal cords under direct vision, positive ETCO2 and breath sounds checked- equal and bilateral Secured at: 23 cm Tube secured with: Tape Dental Injury: Teeth and Oropharynx as per pre-operative assessment

## 2020-09-07 NOTE — Discharge Instructions (Signed)
Activity:  You are encouraged to ambulate frequently (about every hour during waking hours) to help prevent blood clots from forming in your legs or lungs.  However, you should not engage in any heavy lifting (> 10-15 lbs), strenuous activity, or straining. Diet: You should advance your diet as instructed by your physician.  It will be normal to have some bloating, nausea, and abdominal discomfort intermittently. Prescriptions:  You will be provided a prescription for pain medication to take as needed.  If your pain is not severe enough to require the prescription pain medication, you may take extra strength Tylenol instead which will have less side effects.  You should also take a prescribed stool softener to avoid straining with bowel movements as the prescription pain medication may constipate you. Incisions: You may remove your dressing bandages 48 hours after surgery if not removed in the hospital.  You will either have some small staples or special tissue glue at each of the incision sites. Once the bandages are removed (if present), the incisions may stay open to air.  You may start showering (but not soaking or bathing in water) the 2nd day after surgery and the incisions simply need to be patted dry after the shower.  No additional care is needed. What to call us about: You should call the office [phone]) if you develop fever > 101 or develop persistent vomiting.   You may resume aspirin, advil, aleve, vitamins, and supplements 7 days after surgery.

## 2020-09-07 NOTE — H&P (Signed)
Urology Preoperative H&P   Chief Complaint: UCC of the right renal pelvis and distal ureter  History of Present Illness: Dylan Harvey is a 70 y.o. male, referred by Dr. Gloriann Loan, after he was found to have high-grade urothelial cell carcinoma the right renal pelvis and distal right ureter.  The lesion was initially discovered during an evaluation for gross hematuria in March.  CT from 06/09/2020 revealed a solid and enhancing mass within the right renal pelvis with no findings concerning for metastatic disease.  Eventually had a ureteroscopy on 06/29/2020 with Dr. Gloriann Loan, which confirmed the presence of a large urothelial cell carcinoma.  CT chest from 05/30/2020 showed no evidence of pulmonary metastasis.  The patient is urinating without difficulty denies interval episodes of gross hematuria or dysuria.    Past Medical History:  Diagnosis Date   Allergy    Arthritis    thumbs   Colonic adenoma 2009   ED (erectile dysfunction)    Emphysema lung ([place]) MILD    Gross hematuria    Hematospermia    History of colon polyps    History of radiation therapy 01/18/20-01/25/2020   Left Lung 3 fx; Dr. Gery Pray   History of radiation therapy 07/24/2018-07/31/2018   SBRT Left lung; Dr Gery Pray   Hyperlipemia    Hypertension    Left testicular pain    Lumbago    Neoplasm of kidney    Renal cancer [facility])    Renal cyst    Right testicular pain    Tobacco abuse     Past Surgical History:  Procedure Laterality Date   COLONOSCOPY     FUDUCIAL PLACEMENT N/A 06/11/2018   Procedure: PLACEMENT OF FUDUCIAL;  Surgeon: Melrose Nakayama, MD;  Location: [place];  Service: Thoracic;  Laterality: N/A;   HERNIA REPAIR     inguinal   INNER EAR SURGERY     POLYPECTOMY     VIDEO BRONCHOSCOPY WITH ENDOBRONCHIAL NAVIGATION N/A 06/11/2018   Procedure: VIDEO BRONCHOSCOPY WITH ENDOBRONCHIAL NAVIGATION;  Surgeon: Melrose Nakayama, MD;  Location: [place];  Service: Thoracic;  Laterality: N/A;   WISDOM  TOOTH EXTRACTION      Allergies:  Allergies  Allergen Reactions   Neomycin-Bacitracin Zn-Polymyx Swelling and Rash    Family History  Problem Relation Age of Onset   Alzheimer's disease Mother    Hypothyroidism Mother    Severe combined immunodeficiency Mother    Lung cancer Father    Prostate cancer Father    Colon cancer Neg Hx    Colon polyps Neg Hx    Esophageal cancer Neg Hx    Rectal cancer Neg Hx    Stomach cancer Neg Hx     Social History:  reports that he has been smoking cigarettes. He has been smoking an average of 1.00 packs per day. He has never used smokeless tobacco. He reports current alcohol use of about 2.0 standard drinks of alcohol per week. He reports that he does not use drugs.  ROS: A complete review of systems was performed.  All systems are negative except for pertinent findings as noted.  Physical Exam:  Vital signs in last 24 hours: Temp:  [98 F (36.7 C)] 98 F (36.7 C) (06/22 0528) Pulse Rate:  [88] 88 (06/22 0528) Resp:  [16] 16 (06/22 0528) BP: (158)/(91) 158/91 (06/22 0528) SpO2:  [97 %] 97 % (06/22 0528) Weight:  [70.8 kg] 70.8 kg (06/22 0600) Constitutional:  Alert and oriented, No acute distress Cardiovascular: Regular rate  and rhythm, No JVD Respiratory: Normal respiratory effort, Lungs clear bilaterally GI: Abdomen is soft, nontender, nondistended, no abdominal masses GU: No CVA tenderness Lymphatic: No lymphadenopathy Neurologic: Grossly intact, no focal deficits Psychiatric: Normal mood and affect  Laboratory Data:  Recent Labs    09/05/20 1346  WBC 7.2  HGB 13.1  HCT 36.4*  PLT 192    Recent Labs    09/05/20 1346  NA 132*  K 4.3  CL 96*  GLUCOSE 132*  BUN 14  CALCIUM 9.4  CREATININE 0.93     No results found for this or any previous visit (from the past 24 hour(s)). Recent Results (from the past 240 hour(s))  SARS CORONAVIRUS 2 (TAT 6-24 HRS) Nasopharyngeal Nasopharyngeal Swab     Status: None    Collection Time: 09/05/20  3:30 PM   Specimen: Nasopharyngeal Swab  Result Value Ref Range Status   SARS Coronavirus 2 NEGATIVE NEGATIVE Final    Comment: (NOTE) SARS-CoV-2 target nucleic acids are NOT DETECTED.  The SARS-CoV-2 RNA is generally detectable in upper and lower respiratory specimens during the acute phase of infection. Negative results do not preclude SARS-CoV-2 infection, do not rule out co-infections with other pathogens, and should not be used as the sole basis for treatment or other patient management decisions. Negative results must be combined with clinical observations, patient history, and epidemiological information. The expected result is Negative.  Fact Sheet for Patients: SugarRoll.be  Fact Sheet for Healthcare Providers: https://www.woods-mathews.com/  This test is not yet approved or cleared by the [place] FDA and  has been authorized for detection and/or diagnosis of SARS-CoV-2 by FDA under an Emergency Use Authorization (EUA). This EUA will remain  in effect (meaning this test can be used) for the duration of the COVID-19 declaration under Se ction 564(b)(1) of the Act, 21 U.S.C. section 360bbb-3(b)(1), unless the authorization is terminated or revoked sooner.  Performed at [facility], [place] [address], [place], [place] [address]     Renal Function: Recent Labs    09/05/20 1346  CREATININE 0.93   Estimated Creatinine Clearance: 73 mL/min (by C-G formula based on SCr of 0.93 mg/dL).  Radiologic Imaging: CLINICAL DATA: Gross hematuria.  EXAM: CT ABDOMEN AND PELVIS WITHOUT AND WITH CONTRAST  TECHNIQUE: Multidetector CT imaging of the abdomen and pelvis was performed following the standard protocol before and following the bolus administration of intravenous contrast.  CONTRAST: 125 cc Omnipaque 300  COMPARISON: 12/19/2016  FINDINGS: Lower chest: The lung bases are clear of an acute  process. No worrisome pulmonary lesions or pleural effusion. The heart is normal in size. No pericardial effusion.  Hepatobiliary: No hepatic lesions or intrahepatic biliary dilatation. Gallbladder is unremarkable. No common bile duct dilatation.  Pancreas: No mass, inflammation or ductal dilatation.  Spleen: Normal size. No focal lesions.  Adrenals/Urinary Tract: The adrenal glands are normal.  High attenuation noted in the right mid and lower pole collecting system measuring 36 Hounsfield units. This subsequently demonstrates contrast enhancement and appears as a significant filling defect on the delayed images. Findings consistent with uroepithelial neoplasm. I do not see any definite involvement of the renal parenchyma. I think there is another small area of tumor involving the right upper ureter with enhancing tumor and filling defect on delayed images. No mid distal ureteral involvement.  Mild uniform bladder wall thickening but no bladder mass is identified. The left renal collecting system and left ureter are unremarkable.  Simple appearing renal cyst. No worrisome renal  parenchymal lesions.  Stomach/Bowel: The stomach, duodenum, small bowel and colon are unremarkable. No acute inflammatory changes, mass lesions or obstructive findings.  Vascular/Lymphatic: The aorta is normal in caliber. Moderate atherosclerotic calcifications. Branch vessel calcifications are also noted. The major venous structures are patent.  Small scattered mesenteric and retroperitoneal lymph nodes but no mass or overt adenopathy.  Reproductive: The prostate gland and seminal vesicles are unremarkable.  Other: No pelvic mass or adenopathy. No free pelvic fluid collections. No inguinal mass or adenopathy. No abdominal wall hernia or subcutaneous lesions.  Musculoskeletal: No worrisome lytic or sclerotic bone lesions. Degenerative lumbar spondylosis with multilevel disc disease and facet  disease.  IMPRESSION: 1. 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 2. No findings for metastatic disease involving the abdomen/pelvis. 3. Mild uniform bladder wall thickening but no bladder mass. 4. Aortic atherosclerosis.  Aortic Atherosclerosis (ICD10-I70.0).   Electronically Signed By: Marijo Sanes M.D. On: 06/09/2020 11:46  I independently reviewed the above imaging studies.  Assessment and Plan Dylan Harvey is a 70 y.o. male with high-grade urothelial cell carcinoma of the right renal pelvis and distal ureter  The risk, benefits and alternatives of cystoscopy with TUR of the right ureteral orifice, robot-assisted laparoscopic right nephro ureterectomy were discussed in detail, including, but not limited to: Negative pathology, open conversion, infection of the skin/abdominal cavity, VTE, MI/CVA, lymphatic leak, injury to adjacent solid/hollow viscus organs, bleeding requiring a blood transfusion, gastric bleeding, bladder perforation, hernia formation and other imponderables.  The patient voices understanding wishes to proceed.  Ellison Hughs, MD 09/07/2020, 7:08 AM  [facility] Pager: [phone]

## 2020-09-08 ENCOUNTER — Encounter (HOSPITAL_COMMUNITY): Payer: Self-pay | Admitting: Urology

## 2020-09-08 LAB — BASIC METABOLIC PANEL
Anion gap: 11 (ref 5–15)
BUN: 19 mg/dL (ref 8–23)
CO2: 25 mmol/L (ref 22–32)
Calcium: 8.5 mg/dL — ABNORMAL LOW (ref 8.9–10.3)
Chloride: 90 mmol/L — ABNORMAL LOW (ref 98–111)
Creatinine, Ser: 1.91 mg/dL — ABNORMAL HIGH (ref 0.61–1.24)
GFR, Estimated: 37 mL/min — ABNORMAL LOW (ref >60)
Glucose, Bld: 161 mg/dL — ABNORMAL HIGH (ref 70–99)
Potassium: 3.9 mmol/L (ref 3.5–5.1)
Sodium: 126 mmol/L — ABNORMAL LOW (ref 135–145)

## 2020-09-08 LAB — CREATININE, FLUID (PLEURAL, PERITONEAL, JP DRAINAGE): Creat, Fluid: 1.7 mg/dL

## 2020-09-08 LAB — HEMOGLOBIN AND HEMATOCRIT, BLOOD
HCT: 35.3 % — ABNORMAL LOW (ref 39.0–52.0)
Hemoglobin: 12.4 g/dL — ABNORMAL LOW (ref 13.0–17.0)

## 2020-09-08 MED ORDER — BISACODYL 10 MG RE SUPP
10.0000 mg | Freq: Once | RECTAL | Status: AC
  Administered 2020-09-08: 10 mg via RECTAL
  Filled 2020-09-08: qty 1

## 2020-09-08 MED ORDER — BISACODYL 10 MG RE SUPP
10.0000 mg | Freq: Once | RECTAL | Status: AC
  Administered 2020-09-09: 10 mg via RECTAL
  Filled 2020-09-08: qty 1

## 2020-09-08 NOTE — Progress Notes (Signed)
1 Day Post-Op Subjective: Patient reports good pain control and nausea.  He has a productive cough this am.  No flatus. Amb x 1 last night.  Rarely using IS.  JP output appropriate.  Good UO.  Objective: Vital signs in last 24 hours: Temp:  [96.4 F (35.8 C)-98.6 F (37 C)] 98.5 F (36.9 C) (06/23 0409) Pulse Rate:  [51-93] 53 (06/23 0409) Resp:  [13-29] 17 (06/23 0409) BP: (116-170)/(51-105) 144/58 (06/23 0409) SpO2:  [97 %-100 %] 98 % (06/23 0409) Weight:  [71.5 kg] 71.5 kg (06/22 1616)  Intake/Output from previous day: 06/22 0701 - 06/23 0700 In: 3301.8 [I.V.:2812.1; IV Piggyback:489.8] Out: 970 [Urine:870; Drains:80; Blood:20] Intake/Output this shift: No intake/output data recorded.  Physical Exam:  General:alert, cooperative, and mild distress Cardiovascular: RRR Lungs: decreased BS bases GI: soft, non tender, decreased bowel sounds, no palpable masses Incisions: C/D/I JP with scant serosang fluid Urine: bloody with some clots  Lab Results: Recent Labs    09/05/20 1346 09/07/20 1159  HGB 13.1 13.4  HCT 36.4* 38.4*   BMET Recent Labs    09/05/20 1346  NA 132*  K 4.3  CL 96*  CO2 28  GLUCOSE 132*  BUN 14  CREATININE 0.93  CALCIUM 9.4   Recent Labs    09/05/20 1346  INR 1.0   No results for input(s): LABURIN in the last 72 hours. Results for orders placed or performed during the hospital encounter of 09/05/20  SARS CORONAVIRUS 2 (TAT 6-24 HRS) Nasopharyngeal Nasopharyngeal Swab     Status: None   Collection Time: 09/05/20  3:30 PM   Specimen: Nasopharyngeal Swab  Result Value Ref Range Status   SARS Coronavirus 2 NEGATIVE NEGATIVE Final    Comment: (NOTE) SARS-CoV-2 target nucleic acids are NOT DETECTED.  The SARS-CoV-2 RNA is generally detectable in upper and lower respiratory specimens during the acute phase of infection. Negative results do not preclude SARS-CoV-2 infection, do not rule out co-infections with other pathogens, and should not  be used as the sole basis for treatment or other patient management decisions. Negative results must be combined with clinical observations, patient history, and epidemiological information. The expected result is Negative.  Fact Sheet for Patients: SugarRoll.be  Fact Sheet for Healthcare Providers: https://www.woods-mathews.com/  This test is not yet approved or cleared by the [place] FDA and  has been authorized for detection and/or diagnosis of SARS-CoV-2 by FDA under an Emergency Use Authorization (EUA). This EUA will remain  in effect (meaning this test can be used) for the duration of the COVID-19 declaration under Se ction 564(b)(1) of the Act, 21 U.S.C. section 360bbb-3(b)(1), unless the authorization is terminated or revoked sooner.  Performed at [facility], [place] [address] [place], [place] [address]     Studies/Results: No results found.  Assessment/Plan: 1 Day Post-Op, Procedure(s) (LRB): XI ROBOT ASSITED LAPAROSCOPIC NEPHROURETERECTOMY/ CYSTOSCOPY/ STENT PLACEMENT/ TRANSURETHRAL RESECTION OF URETERAL ORIFICE (Right)  Ambulate Aggressive IS Leave diet at clears due to nausea and no return of bowel function Dulcolax supp Check JP Cr Leave foley until JP Cr assessed  Hand irrigate foley prn  Labs not back yet.  F/U when avail. Poss d/c home tomorrow   LOS: 1 day   Debbrah Alar 09/08/2020, 7:40 AM

## 2020-09-09 LAB — BASIC METABOLIC PANEL
Anion gap: 9 (ref 5–15)
BUN: 14 mg/dL (ref 8–23)
CO2: 26 mmol/L (ref 22–32)
Calcium: 8.4 mg/dL — ABNORMAL LOW (ref 8.9–10.3)
Chloride: 89 mmol/L — ABNORMAL LOW (ref 98–111)
Creatinine, Ser: 1.29 mg/dL — ABNORMAL HIGH (ref 0.61–1.24)
GFR, Estimated: 59 mL/min — ABNORMAL LOW (ref >60)
Glucose, Bld: 160 mg/dL — ABNORMAL HIGH (ref 70–99)
Potassium: 3.8 mmol/L (ref 3.5–5.1)
Sodium: 124 mmol/L — ABNORMAL LOW (ref 135–145)

## 2020-09-09 LAB — SURGICAL PATHOLOGY

## 2020-09-09 LAB — HEMOGLOBIN AND HEMATOCRIT, BLOOD
HCT: 34.9 % — ABNORMAL LOW (ref 39.0–52.0)
Hemoglobin: 12.4 g/dL — ABNORMAL LOW (ref 13.0–17.0)

## 2020-09-09 MED ORDER — FAMOTIDINE 20 MG PO TABS
20.0000 mg | ORAL_TABLET | Freq: Every day | ORAL | Status: DC | PRN
  Administered 2020-09-10: 20 mg via ORAL
  Filled 2020-09-09: qty 1

## 2020-09-09 MED ORDER — HYDROMORPHONE HCL 1 MG/ML IJ SOLN
0.5000 mg | INTRAMUSCULAR | Status: DC | PRN
  Administered 2020-09-09: 0.5 mg via INTRAVENOUS
  Filled 2020-09-09: qty 0.5

## 2020-09-09 MED ORDER — ACETAMINOPHEN 325 MG PO TABS: 650.0000 mg | ORAL_TABLET | Freq: Four times a day (QID) | ORAL | Status: DC | PRN

## 2020-09-09 NOTE — Progress Notes (Signed)
2 Days Post-Op Subjective: Patient reports feeling better this afternoon than this morning.  Nausea resolved.  Has ambulated several times.  Still no flatus despite dulcolax supp and senna.  Still on clears. Labs stable. JP with minimal output. Has some heartburn last night.   Objective: Vital signs in last 24 hours: Temp:  [98.1 F (36.7 C)-99.7 F (37.6 C)] 98.9 F (37.2 C) (06/24 1327) Pulse Rate:  [75-97] 97 (06/24 1327) Resp:  [18-22] 22 (06/24 1327) BP: (128-177)/(61-109) 141/83 (06/24 1327) SpO2:  [93 %-99 %] 98 % (06/24 1327)  Intake/Output from previous day: 06/23 0701 - 06/24 0700 In: 1496.5 [P.O.:700; I.V.:796.5] Out: 703 [Urine:700; Drains:3] Intake/Output this shift: Total I/O In: -  Out: 5 [Drains:5]  Physical Exam:  General:alert, cooperative, and no distress GI: soft, non tender, bowel sounds present, no palpable masses Incisions: C/D/I Foley in place with bloody/yellow urine  Lab Results: Recent Labs    09/07/20 1159 09/08/20 0826 09/09/20 0425  HGB 13.4 12.4* 12.4*  HCT 38.4* 35.3* 34.9*   BMET Recent Labs    09/08/20 0826 09/09/20 0425  NA 126* 124*  K 3.9 3.8  CL 90* 89*  CO2 25 26  GLUCOSE 161* 160*  BUN 19 14  CREATININE 1.91* 1.29*  CALCIUM 8.5* 8.4*   No results for input(s): LABPT, INR in the last 72 hours. No results for input(s): LABURIN in the last 72 hours. Results for orders placed or performed during the hospital encounter of 09/05/20  SARS CORONAVIRUS 2 (TAT 6-24 HRS) Nasopharyngeal Nasopharyngeal Swab     Status: None   Collection Time: 09/05/20  3:30 PM   Specimen: Nasopharyngeal Swab  Result Value Ref Range Status   SARS Coronavirus 2 NEGATIVE NEGATIVE Final    Comment: (NOTE) SARS-CoV-2 target nucleic acids are NOT DETECTED.  The SARS-CoV-2 RNA is generally detectable in upper and lower respiratory specimens during the acute phase of infection. Negative results do not preclude SARS-CoV-2 infection, do not rule  out co-infections with other pathogens, and should not be used as the sole basis for treatment or other patient management decisions. Negative results must be combined with clinical observations, patient history, and epidemiological information. The expected result is Negative.  Fact Sheet for Patients: SugarRoll.be  Fact Sheet for Healthcare Providers: https://www.woods-mathews.com/  This test is not yet approved or cleared by the [place] FDA and  has been authorized for detection and/or diagnosis of SARS-CoV-2 by FDA under an Emergency Use Authorization (EUA). This EUA will remain  in effect (meaning this test can be used) for the duration of the COVID-19 declaration under Se ction 564(b)(1) of the Act, 21 U.S.C. section 360bbb-3(b)(1), unless the authorization is terminated or revoked sooner.  Performed at [facility], [place] [address] [place], [place] [address]     Studies/Results: No results found.  Assessment/Plan: 2 Days Post-Op Procedure(s) (LRB): XI ROBOT ASSITED LAPAROSCOPIC NEPHROURETERECTOMY/ CYSTOSCOPY/ STENT PLACEMENT/ TRANSURETHRAL RESECTION OF URETERAL ORIFICE (Right) Improving D/C JP Ambulate IS Advance diet SLIV GERD meds prn Transition to PO pain meds Foley education Plan for d/c tomorrow morning if tolerating diet   LOS: 2 days   Debbrah Alar 09/09/2020, 2:07 PM

## 2020-09-10 MED ORDER — METOCLOPRAMIDE HCL 5 MG/ML IJ SOLN
5.0000 mg | Freq: Four times a day (QID) | INTRAMUSCULAR | Status: DC
  Administered 2020-09-10 – 2020-09-11 (×5): 5 mg via INTRAVENOUS
  Filled 2020-09-10 (×5): qty 2

## 2020-09-10 MED ORDER — BISACODYL 10 MG RE SUPP
10.0000 mg | Freq: Once | RECTAL | Status: AC
  Administered 2020-09-10: 10 mg via RECTAL
  Filled 2020-09-10: qty 1

## 2020-09-10 NOTE — Progress Notes (Signed)
Patient ID: Dylan Harvey, male   DOB: April 08, 1950, 70 y.o.   MRN: [id number]  3 Days Post-Op Subjective: Pt still with no flatus.  Belching still.  Pain controlled. Ambulating well.  Only able to tolerate clears thus far.  Objective: Vital signs in last 24 hours: Temp:  [98.9 F (37.2 C)-99.2 F (37.3 C)] 99.2 F (37.3 C) (06/25 0524) Pulse Rate:  [92-97] 92 (06/25 0524) Resp:  [20-22] 20 (06/25 0524) BP: (141-165)/(83-86) 165/86 (06/25 0524) SpO2:  [97 %-98 %] 97 % (06/25 0524)  Intake/Output from previous day: 06/24 0701 - 06/25 0700 In: 120 [P.O.:120] Out: 39 [Urine:800; Drains:5] Intake/Output this shift: Total I/O In: 120 [P.O.:120] Out: 800 [Urine:800]  Physical Exam:  General: Alert and oriented CV: RRR Lungs: Clear Abdomen: Soft, ND, positive BS Incisions: C/D/I Ext: NT, No erythema  Lab Results: Recent Labs    09/07/20 1159 09/08/20 0826 09/09/20 0425  HGB 13.4 12.4* 12.4*  HCT 38.4* 35.3* 34.9*   BMET Recent Labs    09/08/20 0826 09/09/20 0425  NA 126* 124*  K 3.9 3.8  CL 90* 89*  CO2 25 26  GLUCOSE 161* 160*  BUN 19 14  CREATININE 1.91* 1.29*  CALCIUM 8.5* 8.4*     Studies/Results: Drain Cr 1.7 yesterday  Assessment/Plan: POD # 3 s/p RAL nephroureterectomy - D/C drain - Continue to await return of bowel function and then advance diet as tolerated - Ambulate, DVT prophylaxix - D/C once return of bowel function - Continue Foley catheter   LOS: 3 days   [name] 09/10/2020, 6:40 AM

## 2020-09-11 NOTE — Discharge Summary (Signed)
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 Briefly, Dylan Harvey is a 70 y.o. 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  He presents this hospital admission to undergo a right robot-assisted laparoscopic nephro ureterectomy.  Hospital Course: He underwent a right robot-assisted laparoscopic nephro ureterectomy by Dr. Lovena Harvey on 09/07/2020.  He tolerated this procedure well and without complications.  Postoperatively, he was able to gradually begin ambulating and was transitioned to oral pain medication.  His pelvic drain output remained minimal and was able to be removed on postoperative day 2.  He did have a mild postoperative ileus requiring slow advancement of his diet.  However, he had return of bowel function on postoperative day 3 and was able to have his diet advanced.  By the morning of postoperative day 4, he was ambulating well and tolerating a regular diet.  He was stable for discharge home.  His renal function had also improved.  He will be discharged home with his Foley catheter with plans to proceed with follow-up and likely catheter removal later in the week.  Laboratory values:  Recent Labs    09/08/20 0826 09/09/20 0425  HGB 12.4* 12.4*  HCT 35.3* 34.9*   Recent Labs    09/08/20 0826 09/09/20 0425  CREATININE 1.91* 1.29*    Disposition: Home  Discharge instruction: The patient was instructed to be ambulatory but told to refrain from heavy lifting, strenuous activity, or driving.   Discharge medications:  Allergies as of 09/11/2020       Reactions   Neomycin-bacitracin Zn-polymyx  Swelling, Rash        Medication List     STOP taking these medications    Alfalfa 500 MG Tabs   Fish Oil 1200 MG Caps   GREEN TEA PO   Lecithin 1200 MG Caps   multivitamin with minerals Tabs tablet   NON FORMULARY   PROBIOTIC PO   PSYLLIUM HUSK PO   RA FLAX SEED OIL 1000 PO   vitamin C 500 MG tablet Commonly known as: ASCORBIC ACID   zinc gluconate 50 MG tablet       TAKE these medications    b complex vitamins tablet Take 1 tablet by mouth daily.   cholecalciferol 1000 units tablet Commonly known as: VITAMIN D Take 1,000 Units by mouth daily.   docusate sodium 100 MG capsule Commonly known as: COLACE Take 1 capsule (100 mg total) by mouth 2 (two) times daily.   fexofenadine 60 MG tablet Commonly known as: ALLEGRA Take 60 mg by mouth daily.   HYDROcodone-acetaminophen 5-325 MG tablet Commonly known as: Norco Take 1-2 tablets by mouth every 6 (six) hours as needed for moderate pain.   lisinopril 10 MG tablet Commonly known as: ZESTRIL Take 10 mg by mouth daily.   PRESCRIPTION MEDICATION Place 1 capsule into the right ear every other day. Sulfacetamide amphoteracin b - chloramphenincol - hydrocortisone   promethazine 12.5 MG tablet Commonly known as: PHENERGAN Take 1 tablet (12.5 mg total) by mouth every 4 (four) hours as needed for nausea or vomiting.   sodium chloride 0.65 % Soln nasal spray Commonly known as: OCEAN Place 1 spray into the nose daily.  Followup:   Follow-up Information     Dylan Mons, MD Follow up on 09/15/2020.   Specialty: Urology Why: at 11:15 Contact information: [address] [place] [id number] [phone]

## 2020-09-11 NOTE — Progress Notes (Signed)
Patient ID: Dylan Harvey, male   DOB: Aug 03, 1950, 70 y.o.   MRN: [id number]  4 Days Post-Op Subjective: Pt doing well.  Began passing flatus yesterday and tolerating regular diet.  Ambulating well.  Objective: Vital signs in last 24 hours: Temp:  [98.8 F (37.1 C)-99 F (37.2 C)] 98.9 F (37.2 C) (06/26 0443) Pulse Rate:  [86-104] 88 (06/26 0443) Resp:  [17-20] 18 (06/26 0443) BP: (136-176)/(67-90) 145/90 (06/26 0443) SpO2:  [95 %-99 %] 95 % (06/26 0443)  Intake/Output from previous day: 06/25 0701 - 06/26 0700 In: 240 [P.O.:240] Out: 1325 [Urine:1325] Intake/Output this shift: No intake/output data recorded.  Physical Exam:  General: Alert and oriented Abdomen: Soft, ND, positive BS Incisions: C/D/I GU: Urine clear in catheter. Ext: NT, No erythema  Lab Results: Recent Labs    09/08/20 0826 09/09/20 0425  HGB 12.4* 12.4*  HCT 35.3* 34.9*   BMET Recent Labs    09/08/20 0826 09/09/20 0425  NA 126* 124*  K 3.9 3.8  CL 90* 89*  CO2 25 26  GLUCOSE 161* 160*  BUN 19 14  CREATININE 1.91* 1.29*  CALCIUM 8.5* 8.4*     Studies/Results: No results found.  Assessment/Plan: POD # 4 s/p RAL nephroureterectomy - Will d/c home this morning.   LOS: 4 days   Dylan Harvey 09/11/2020, 7:34 AM

## 2020-09-11 NOTE — TOC Transition Note (Signed)
Transition of Care [facility]) - CM/SW Discharge Note   Patient Details  Name: Dylan Harvey MRN: [id number] Date of Birth: Jun 24, 1950  [facility]) CM/SW Contact:  Dessa Phi, RN Phone Number: 09/11/2020, 12:51 PM   Clinical Narrative:   d/c today. No CM needs or orders.          Patient Goals and CMS Choice        Discharge Placement                       Discharge Plan and Services                                     Social Determinants of Health (SDOH) Interventions     Readmission Risk Interventions No flowsheet data found.

## 2020-09-15 ENCOUNTER — Encounter: Payer: Self-pay | Admitting: Internal Medicine

## 2020-09-15 DIAGNOSIS — C651 Malignant neoplasm of right renal pelvis: Secondary | ICD-10-CM | POA: Diagnosis not present

## 2020-09-20 ENCOUNTER — Telehealth: Payer: Self-pay | Admitting: Oncology

## 2020-09-20 NOTE — Telephone Encounter (Signed)
Received a new pt referral from Dr.  Lovena Neighbours for history of pT2 urothelial cell carcinoma of the right pelvic. Dylan Harvey has been cld and scheduled to see Dr. Alen Blew on 7/13 at 2pm. Pt aware to arrive 15 minutes early.

## 2020-09-28 ENCOUNTER — Other Ambulatory Visit: Payer: Self-pay

## 2020-09-28 ENCOUNTER — Inpatient Hospital Stay: Payer: Medicare Other | Attending: Oncology | Admitting: Oncology

## 2020-09-28 VITALS — BP 97/58 | HR 102 | Temp 98.3°F | Resp 17 | Ht 72.0 in | Wt 142.4 lb

## 2020-09-28 DIAGNOSIS — F17210 Nicotine dependence, cigarettes, uncomplicated: Secondary | ICD-10-CM | POA: Diagnosis not present

## 2020-09-28 DIAGNOSIS — C3432 Malignant neoplasm of lower lobe, left bronchus or lung: Secondary | ICD-10-CM

## 2020-09-28 DIAGNOSIS — N2889 Other specified disorders of kidney and ureter: Secondary | ICD-10-CM

## 2020-09-28 DIAGNOSIS — C651 Malignant neoplasm of right renal pelvis: Secondary | ICD-10-CM

## 2020-09-28 NOTE — Progress Notes (Signed)
Reason for the request:    Renal pelvis cancer  HPI: I was asked by Dr. Lovena Neighbours to evaluate to evaluate Dylan Harvey for high-grade urothelial carcinoma of the renal pelvis.  He presented with hematuria back in March 2022 and found to have a solid mass in the right renal pelvis.  Ureteroscopy done by Dr. Gloriann Loan showed a large mass at the renal pelvis.  Based on these findings he underwent robotic assisted right nephro ureterectomy completed by Dr. Lovena Neighbours on September 07, 2020.  The final pathology revealed high-grade papillary urothelial carcinoma measuring 4.5 cm with muscle invasion indicating T2 disease.  Imaging studies of the chest abdomen and pelvis did not show any evidence of metastatic disease or lymphadenopathy.  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  He received definitive treatment under the care of Dr. Dorathy Daft for a total of 52 Pearline Cables completed in November 2021.  Clinically, he reports feeling better after his surgery but still recovering slowly.  He had acid reflux which was fairly severe but recovering slowly at this time.  He has lost some weight but is eating better currently.  He denies any hematuria, dysuria or excessive fatigue.  He does not report any headaches, blurry vision, syncope or seizures. Does not report any fevers, chills or sweats.  Does not report any cough, wheezing or hemoptysis.  Does not report any chest pain, palpitation, orthopnea or leg edema.  Does not report any nausea, vomiting or abdominal pain.  Does not report any constipation or diarrhea.  Does not report any skeletal complaints.    Does not report frequency, urgency or hematuria.  Does not report any skin rashes or lesions. Does not report any heat or cold intolerance.  Does not report any lymphadenopathy or petechiae.  Does not report any anxiety or depression.  Remaining review of systems is negative.     Past Medical History:  Diagnosis Date   Allergy     Arthritis    thumbs   Colonic adenoma 2009   ED (erectile dysfunction)    Emphysema lung ([place]) MILD    Gross hematuria    Hematospermia    History of colon polyps    History of radiation therapy 01/18/20-01/25/2020   Left Lung 3 fx; Dr. Gery Pray   History of radiation therapy 07/24/2018-07/31/2018   SBRT Left lung; Dr Gery Pray   Hyperlipemia    Hypertension    Left testicular pain    Lumbago    Neoplasm of kidney    Renal cancer [facility])    Renal cyst    Right testicular pain    Tobacco abuse   :   Past Surgical History:  Procedure Laterality Date   COLONOSCOPY     FUDUCIAL PLACEMENT N/A 06/11/2018   Procedure: PLACEMENT OF FUDUCIAL;  Surgeon: Melrose Nakayama, MD;  Location: [facility];  Service: Thoracic;  Laterality: N/A;   HERNIA REPAIR     inguinal   INNER EAR SURGERY     POLYPECTOMY     ROBOT ASSITED LAPAROSCOPIC NEPHROURETERECTOMY Right 09/07/2020   Procedure: XI ROBOT ASSITED LAPAROSCOPIC NEPHROURETERECTOMY/ CYSTOSCOPY/ STENT PLACEMENT/ TRANSURETHRAL RESECTION OF URETERAL ORIFICE;  Surgeon: Ceasar Mons, MD;  Location: [facility];  Service: Urology;  Laterality: Right;   VIDEO BRONCHOSCOPY WITH ENDOBRONCHIAL NAVIGATION N/A 06/11/2018   Procedure: VIDEO BRONCHOSCOPY WITH ENDOBRONCHIAL NAVIGATION;  Surgeon: Melrose Nakayama, MD;  Location: [facility];  Service: Thoracic;  Laterality: N/A;   WISDOM TOOTH  EXTRACTION    :   Current Outpatient Medications:    b complex vitamins tablet, Take 1 tablet by mouth daily., Disp: , Rfl:    cholecalciferol (VITAMIN D) 1000 units tablet, Take 1,000 Units by mouth daily., Disp: , Rfl:    docusate sodium (COLACE) 100 MG capsule, Take 1 capsule (100 mg total) by mouth 2 (two) times daily., Disp: , Rfl:    fexofenadine (ALLEGRA) 60 MG tablet, Take 60 mg by mouth daily., Disp: , Rfl:    HYDROcodone-acetaminophen (NORCO) 5-325 MG tablet, Take 1-2 tablets by mouth every 6 (six) hours as needed for moderate pain., Disp: 20  tablet, Rfl: 0   lisinopril (ZESTRIL) 10 MG tablet, Take 10 mg by mouth daily., Disp: , Rfl:    PRESCRIPTION MEDICATION, Place 1 capsule into the right ear every other day. Sulfacetamide amphoteracin b - chloramphenincol - hydrocortisone, Disp: , Rfl:    promethazine (PHENERGAN) 12.5 MG tablet, Take 1 tablet (12.5 mg total) by mouth every 4 (four) hours as needed for nausea or vomiting., Disp: 15 tablet, Rfl: 0   sodium chloride (OCEAN) 0.65 % SOLN nasal spray, Place 1 spray into the nose daily., Disp: , Rfl: :   Allergies  Allergen Reactions   Neomycin-Bacitracin Zn-Polymyx Swelling and Rash  :   Family History  Problem Relation Age of Onset   Alzheimer's disease Mother    Hypothyroidism Mother    Severe combined immunodeficiency Mother    Lung cancer Father    Prostate cancer Father    Colon cancer Neg Hx    Colon polyps Neg Hx    Esophageal cancer Neg Hx    Rectal cancer Neg Hx    Stomach cancer Neg Hx   :   Social History   Socioeconomic History   Marital status: Married    Spouse name: Not on file   Number of children: Not on file   Years of education: Not on file   Highest education level: Not on file  Occupational History   Not on file  Tobacco Use   Smoking status: Every Day    Packs/day: 1.00    Pack years: 0.00    Types: Cigarettes   Smokeless tobacco: Never  Substance and Sexual Activity   Alcohol use: Yes    Alcohol/week: 2.0 standard drinks    Types: 2 Shots of liquor per week    Comment: occasionally   Drug use: No   Sexual activity: Not on file  Other Topics Concern   Not on file  Social History Narrative   Married 1980 no chilldren. 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  Exam: Blood pressure (!) 97/58, pulse (!) 102, temperature 98.3 F (36.8 C), temperature source Oral, resp. rate 17, height 6' (1.829 m), weight 142 lb 6.4 oz (64.6 kg), SpO2 99 %. ECOG 1 General appearance: alert and cooperative appeared without distress. Head: atraumatic without any abnormalities. Eyes: conjunctivae/corneas clear. PERRL.  Sclera anicteric. Throat: lips, mucosa, and tongue normal; without oral thrush or ulcers. Resp: clear to auscultation bilaterally without rhonchi, wheezes or dullness to percussion. Cardio: regular rate and  rhythm, S1, S2 normal, no murmur, click, rub or gallop GI: soft, non-tender; bowel sounds normal; no masses,  no organomegaly Skin: Skin color, texture, turgor normal. No rashes or lesions Lymph nodes: Cervical, supraclavicular, and axillary nodes normal. Neurologic: Grossly normal without any motor, sensory or deep tendon reflexes. Musculoskeletal: No joint deformity or effusion.   Assessment and Plan:   70 year old with:  1.  Right renal pelvis high-grade urothelial carcinoma diagnosed in June 2022.  He is status post right nephro ureterectomy with the final pathology revealing a T2 tumor without any lymphadenopathy or metastatic disease.  Treatment options moving forward were discussed at this time.  Management choices including active surveillance versus adjuvant platinum based chemotherapy.  The rationale for using adjuvant chemotherapy in the setting was discussed given the fact that he has a upper tract tumor with muscle invasive disease he does not present a high risk of relapse at this time.   The logistics and rationale for using chemotherapy was reviewed today in detail.  Complication associated with cisplatin and gemcitabine chemotherapy was discussed.  These complications include nausea, vomiting, myelosuppression, fatigue, infusion related complications,  renal insufficiency, neutropenia, neutropenic sepsis and rarely serious thrombosis, hospitalization and death.  The plan is to treat with gemcitabine and cisplatin on day 1, gemcitabine day 8 out of a 21-day cycle.  Anticipate needing 4 cycles of therapy    After discussion today, he opted against chemotherapy and elected to proceed with active surveillance only.  He has will continue to follow with Dr. Lovena Neighbours for routine cystoscopy and imaging studies.  He understands that he is at a high risk of developing recurrent disease and at that point systemic therapy with her in the form of immunotherapy or chemotherapy would be considered.      2.  Renal function surveillance: Baseline kidney function is adequate but will need to be monitored on platinum based therapy and solitary kidney.  He is concerned about receiving platinum based therapy with solitary kidney.      3.  Follow-up: I am happy to see him in the future as needed.  60  minutes were dedicated to this visit. The time was spent on reviewing laboratory data, imaging studies, discussing treatment options,  and answering questions regarding future plan.     A copy of this consult has been forwarded to the requesting physician.

## 2020-10-05 DIAGNOSIS — H6241 Otitis externa in other diseases classified elsewhere, right ear: Secondary | ICD-10-CM | POA: Diagnosis not present

## 2020-10-05 DIAGNOSIS — B369 Superficial mycosis, unspecified: Secondary | ICD-10-CM | POA: Diagnosis not present

## 2020-10-05 DIAGNOSIS — C651 Malignant neoplasm of right renal pelvis: Secondary | ICD-10-CM | POA: Diagnosis not present

## 2020-10-10 DIAGNOSIS — C651 Malignant neoplasm of right renal pelvis: Secondary | ICD-10-CM | POA: Diagnosis not present

## 2020-10-10 DIAGNOSIS — R8279 Other abnormal findings on microbiological examination of urine: Secondary | ICD-10-CM | POA: Diagnosis not present

## 2020-11-16 ENCOUNTER — Telehealth: Payer: Self-pay | Admitting: *Deleted

## 2020-11-16 DIAGNOSIS — Z9089 Acquired absence of other organs: Secondary | ICD-10-CM | POA: Diagnosis not present

## 2020-11-16 DIAGNOSIS — H60391 Other infective otitis externa, right ear: Secondary | ICD-10-CM | POA: Diagnosis not present

## 2020-11-16 NOTE — Telephone Encounter (Signed)
CALLED PATIENT TO INFORM OF CT FOR 12-02-20 - ARRIVAL TIME- 11:45 AM @ [facility], NO RESTRICTIONS TO TEST, PATIENT TO RECEIVE RESULTS FROM DR. KINARD ON 12-05-20 @ 11 AM, LVM FOR A RETURN CALL

## 2020-11-30 ENCOUNTER — Ambulatory Visit (HOSPITAL_COMMUNITY)
Admission: RE | Admit: 2020-11-30 | Discharge: 2020-11-30 | Disposition: A | Discharge status: Home / Self Care | Payer: Medicare Other | Source: Ambulatory Visit | Attending: Radiation Oncology | Admitting: Radiation Oncology

## 2020-11-30 ENCOUNTER — Other Ambulatory Visit: Payer: Self-pay

## 2020-11-30 DIAGNOSIS — C3432 Malignant neoplasm of lower lobe, left bronchus or lung: Secondary | ICD-10-CM | POA: Diagnosis not present

## 2020-11-30 DIAGNOSIS — I700 Atherosclerosis of aorta: Secondary | ICD-10-CM | POA: Diagnosis not present

## 2020-11-30 DIAGNOSIS — J439 Emphysema, unspecified: Secondary | ICD-10-CM | POA: Diagnosis not present

## 2020-11-30 DIAGNOSIS — C3490 Malignant neoplasm of unspecified part of unspecified bronchus or lung: Secondary | ICD-10-CM | POA: Diagnosis not present

## 2020-11-30 DIAGNOSIS — R918 Other nonspecific abnormal finding of lung field: Secondary | ICD-10-CM | POA: Diagnosis not present

## 2020-11-30 DIAGNOSIS — R911 Solitary pulmonary nodule: Secondary | ICD-10-CM | POA: Diagnosis not present

## 2020-12-02 ENCOUNTER — Ambulatory Visit (HOSPITAL_COMMUNITY): Payer: Medicare Other

## 2020-12-03 NOTE — Progress Notes (Signed)
Radiation Oncology         [phone] ________________________________  Name: Dylan Harvey MRN: [id number]  Date: 12/05/2020  DOB: Aug 22, 1950  Follow-Up Visit Note  CC: Crist Infante, MD  Crist Infante, MD    ICD-10-CM   1. Primary cancer of left lower lobe of lung ([place])  C34.32 CT CHEST WO CONTRAST      Diagnosis:  Clinical Stage IA2 (T1b, N0, M0) Poorly Differentiated Carcinoma presenting in the left lower lung   Interval Since Last Radiation: 10 months and 11 days    Radiation Treatment Dates: 01/18/2020 through 01/25/2020 (new site) Site Technique Total Dose (Gy) Dose per Fx (Gy) Completed Fx Beam Energies  Lung, Left: Lung_Lt IMRT 54/54 18 3/3 6XFFF    I  Initial course of radiation therapy  Radiation treatment dates:   07/24/2018, 07/28/2018, 07/31/2018   Site/dose:   The tumor in the LLL lung was treated with a course of stereotactic body radiation treatment. The patient received 54 Gy in 3 fractions at 18 Gy per fraction.  Narrative:  The patient returns today for routine follow-up, he was last seen by me on 06/02/20 for follow-up.   The patient presented with hematuria this past March and was found to have a solid mass in the right renal pelvis. Ureteroscopy performed by Dr. Gloriann Loan revealed a large mass at the renal pelvis. Subsequently, the patient underwent robotic assisted right nephro ureterectomy under the care of Dr. Lovena Neighbours on 09/07/20. Pathology from the procedure revealed high-grade papillary urothelial carcinoma measuring 4.5 cm; with muscle invasion indicating T2 disease.   Accordingly, the patient was referred to Dr. Alen Blew on 09/28/20 for further evaluation. During this visit, the patient reported feeling better after his surgery but recovering slowly. He denied hematuria, dysuria, or any excessive fatigue. The patient opted against proceeding with chemotherapy and elected to proceed with active surveillance only. The patient will continue to follow-up with  Dr. Lovena Neighbours for routine cystoscopy and imaging studies.  Pertinent imaging since the patient was last seen includes a chest CT performed on 11/30/20 which demonstrated no new or progressive findings. Some postradiation changes in the parahilar left lung were seen, as well as the stable appearance of the left suprahilar nodular opacity and bilateral pulmonary nodules                              Allergies:  is allergic to neomycin-bacitracin zn-polymyx.  Meds: Current Outpatient Medications  Medication Sig Dispense Refill   ALFALFA PO Take by mouth daily.     Ascorbic Acid (VITAMIN C) 1000 MG tablet Take 1,000 mg by mouth daily.     b complex vitamins tablet Take 1 tablet by mouth daily.     calcium carbonate (TUMS - DOSED IN MG ELEMENTAL CALCIUM) 500 MG chewable tablet Chew 1 tablet by mouth as needed for indigestion or heartburn.     cholecalciferol (VITAMIN D) 1000 units tablet Take 1,000 Units by mouth daily.     fexofenadine (ALLEGRA) 60 MG tablet Take 60 mg by mouth daily.     Flaxseed, Linseed, (FLAX SEED OIL PO) Take by mouth daily.     Green Tea, Camellia sinensis, (GREEN TEA EXTRACT PO) Take by mouth daily.     lisinopril (ZESTRIL) 10 MG tablet Take 10 mg by mouth daily.     Omega-3 Fatty Acids (FISH OIL) 1000 MG CAPS Take by mouth daily.     PRESCRIPTION MEDICATION Place  1 capsule into the right ear every other day. Sulfacetamide amphoteracin b - chloramphenincol - hydrocortisone     PSYLLIUM FIBER PO Take by mouth daily.     sodium chloride (OCEAN) 0.65 % SOLN nasal spray Place 1 spray into the nose daily.     Zinc 100 MG TABS Take by mouth daily.     No current facility-administered medications for this encounter.    Physical Findings: The patient is in no acute distress. Patient is alert and oriented.  weight is 151 lb 9.6 oz (68.8 kg). His temperature is 97 F (36.1 C) (abnormal). His blood pressure is 123/75 and his pulse is 73. His respiration is 18 and oxygen saturation  is 100%. .  No significant changes. Lungs are clear to auscultation bilaterally. Heart has regular rate and rhythm. No palpable cervical, supraclavicular, or axillary adenopathy. Abdomen soft, non-tender, normal bowel sounds.    Lab Findings: Lab Results  Component Value Date   WBC 7.2 09/05/2020   HGB 12.4 (L) 09/09/2020   HCT 34.9 (L) 09/09/2020   MCV 96.3 09/05/2020   PLT 192 09/05/2020    Radiographic Findings: CT CHEST WO CONTRAST  Result Date: 12/01/2020 CLINICAL DATA:  Non-small-cell lung cancer.  Restaging. EXAM: CT CHEST WITHOUT CONTRAST TECHNIQUE: Multidetector CT imaging of the chest was performed following the standard protocol without IV contrast. COMPARISON:  05/30/2020 FINDINGS: Cardiovascular: The heart size is normal. No substantial pericardial effusion. Coronary artery calcification is evident. Mitral annular calcification noted. Mild atherosclerotic calcification is noted in the wall of the thoracic aorta. Mediastinum/Nodes: No mediastinal lymphadenopathy. No evidence for gross hilar lymphadenopathy although assessment is limited by the lack of intravenous contrast on the current study. The esophagus has normal imaging features. There is no axillary lymphadenopathy. Lungs/Pleura: Centrilobular and paraseptal emphysema evident. Architectural distortion and scarring in the parahilar left lung is stable consistent with radiation therapy. Parahilar nodular opacity measured previously at 1.2 x 1.1 cm is 1.3 x 1.0 cm today on 86/7. 7 mm left lower lobe pulmonary nodule is stable on 01/20 8/7. 4 mm posterior left lower lobe nodule on 01/30 6/7 is unchanged. Other scattered tiny pulmonary nodules are similar in the interval. No new suspicious pulmonary nodule or mass. No focal airspace consolidation. Upper Abdomen: Unremarkable. Musculoskeletal: No worrisome lytic or sclerotic osseous abnormality. IMPRESSION: 1. Stable exam. No new or progressive findings. 2. Postradiation changes in the  parahilar left lung with stable appearance of the left suprahilar nodular opacity. 3. Stable bilateral pulmonary nodules. 4. Aortic Atherosclerosis (ICD10-I70.0) and Emphysema (ICD10-J43.9). Electronically Signed   By: Misty Stanley M.D.   On: 12/01/2020 11:18    Impression:  Clinical Stage IA2 (T1b, N0, M0) Poorly Differentiated Carcinoma presenting in the left lower lung   The patient reports no lasting side effects from his 2 separate courses of SBRT directed at the solitary pulmonary nodules in the left lung.  Recent chest CT scan shows stability in this area without any new pulmonary nodules.  Plan: Routine follow-up in 6 months.  Prior to this follow-up appointment patient will undergo a CT scan of the chest.  The patient will continue to follow-up with Dr. Lovena Neighbours concerning his high-grade papillary urothelial carcinoma.   20 minutes of total time was spent for this patient encounter, including preparation, face-to-face counseling with the patient and coordination of care, physical exam, and documentation of the encounter. ____________________________________  Blair Promise, PhD, MD   This document serves as a record of services personally performed  by Gery Pray, MD. It was created on his behalf by Roney Mans, a trained medical scribe. The creation of this record is based on the scribe's personal observations and the provider's statements to them. This document has been checked and approved by the attending provider.

## 2020-12-05 ENCOUNTER — Other Ambulatory Visit: Payer: Self-pay

## 2020-12-05 ENCOUNTER — Encounter: Payer: Self-pay | Admitting: Radiation Oncology

## 2020-12-05 ENCOUNTER — Ambulatory Visit
Admission: RE | Admit: 2020-12-05 | Discharge: 2020-12-05 | Disposition: A | Discharge status: Home / Self Care | Payer: Medicare Other | Source: Ambulatory Visit | Attending: Radiation Oncology | Admitting: Radiation Oncology

## 2020-12-05 DIAGNOSIS — Z923 Personal history of irradiation: Secondary | ICD-10-CM | POA: Insufficient documentation

## 2020-12-05 DIAGNOSIS — R911 Solitary pulmonary nodule: Secondary | ICD-10-CM | POA: Diagnosis not present

## 2020-12-05 DIAGNOSIS — J432 Centrilobular emphysema: Secondary | ICD-10-CM | POA: Insufficient documentation

## 2020-12-05 DIAGNOSIS — Z08 Encounter for follow-up examination after completed treatment for malignant neoplasm: Secondary | ICD-10-CM | POA: Diagnosis not present

## 2020-12-05 DIAGNOSIS — Z79899 Other long term (current) drug therapy: Secondary | ICD-10-CM | POA: Insufficient documentation

## 2020-12-05 DIAGNOSIS — R319 Hematuria, unspecified: Secondary | ICD-10-CM | POA: Diagnosis not present

## 2020-12-05 DIAGNOSIS — I700 Atherosclerosis of aorta: Secondary | ICD-10-CM | POA: Diagnosis not present

## 2020-12-05 DIAGNOSIS — C3432 Malignant neoplasm of lower lobe, left bronchus or lung: Secondary | ICD-10-CM | POA: Insufficient documentation

## 2020-12-05 NOTE — Progress Notes (Signed)
Dylan Harvey is here today for follow up post radiation to the lung.  Lung Side: Left, patient completed treatment on 01/25/20  Does the patient complain of any of the following: Pain:Patient denies pain.  Shortness of breath w/wo exertion: no Cough: no Hemoptysis: no Pain with swallowing: no Swallowing/choking concerns: no Appetite: Good Energy Level: reports having a good energy level.  Post radiation skin Changes: intact    Additional comments if applicable:  Vitals:   65/99/35 1103  BP: 123/75  Pulse: 73  Resp: 18  Temp: (!) 97 F (36.1 C)  SpO2: 100%  Weight: 151 lb 9.6 oz (68.8 kg)

## 2020-12-13 DIAGNOSIS — Z1152 Encounter for screening for COVID-19: Secondary | ICD-10-CM | POA: Diagnosis not present

## 2020-12-13 DIAGNOSIS — R051 Acute cough: Secondary | ICD-10-CM | POA: Diagnosis not present

## 2020-12-13 DIAGNOSIS — J189 Pneumonia, unspecified organism: Secondary | ICD-10-CM | POA: Diagnosis not present

## 2020-12-13 DIAGNOSIS — R6883 Chills (without fever): Secondary | ICD-10-CM | POA: Diagnosis not present

## 2020-12-13 DIAGNOSIS — I10 Essential (primary) hypertension: Secondary | ICD-10-CM | POA: Diagnosis not present

## 2020-12-13 DIAGNOSIS — J029 Acute pharyngitis, unspecified: Secondary | ICD-10-CM | POA: Diagnosis not present

## 2020-12-13 DIAGNOSIS — R5383 Other fatigue: Secondary | ICD-10-CM | POA: Diagnosis not present

## 2020-12-13 DIAGNOSIS — R42 Dizziness and giddiness: Secondary | ICD-10-CM | POA: Diagnosis not present

## 2021-01-02 DIAGNOSIS — Z9089 Acquired absence of other organs: Secondary | ICD-10-CM | POA: Diagnosis not present

## 2021-01-02 DIAGNOSIS — H60331 Swimmer's ear, right ear: Secondary | ICD-10-CM | POA: Diagnosis not present

## 2021-01-02 DIAGNOSIS — J189 Pneumonia, unspecified organism: Secondary | ICD-10-CM | POA: Diagnosis not present

## 2021-01-06 DIAGNOSIS — M5451 Vertebrogenic low back pain: Secondary | ICD-10-CM | POA: Diagnosis not present

## 2021-01-09 DIAGNOSIS — K5730 Diverticulosis of large intestine without perforation or abscess without bleeding: Secondary | ICD-10-CM | POA: Diagnosis not present

## 2021-01-09 DIAGNOSIS — N400 Enlarged prostate without lower urinary tract symptoms: Secondary | ICD-10-CM | POA: Diagnosis not present

## 2021-01-09 DIAGNOSIS — C651 Malignant neoplasm of right renal pelvis: Secondary | ICD-10-CM | POA: Diagnosis not present

## 2021-01-09 DIAGNOSIS — I700 Atherosclerosis of aorta: Secondary | ICD-10-CM | POA: Diagnosis not present

## 2021-01-09 DIAGNOSIS — C659 Malignant neoplasm of unspecified renal pelvis: Secondary | ICD-10-CM | POA: Diagnosis not present

## 2021-01-16 DIAGNOSIS — C651 Malignant neoplasm of right renal pelvis: Secondary | ICD-10-CM | POA: Diagnosis not present

## 2021-01-16 DIAGNOSIS — E875 Hyperkalemia: Secondary | ICD-10-CM | POA: Diagnosis not present

## 2021-01-18 ENCOUNTER — Emergency Department (HOSPITAL_BASED_OUTPATIENT_CLINIC_OR_DEPARTMENT_OTHER)
Admission: EM | Admit: 2021-01-18 | Discharge: 2021-01-18 | Disposition: A | Discharge status: Home / Self Care | Payer: Medicare Other | Attending: Emergency Medicine | Admitting: Emergency Medicine

## 2021-01-18 ENCOUNTER — Encounter (HOSPITAL_BASED_OUTPATIENT_CLINIC_OR_DEPARTMENT_OTHER): Payer: Self-pay | Admitting: Obstetrics and Gynecology

## 2021-01-18 ENCOUNTER — Emergency Department (HOSPITAL_BASED_OUTPATIENT_CLINIC_OR_DEPARTMENT_OTHER): Payer: Medicare Other

## 2021-01-18 ENCOUNTER — Other Ambulatory Visit: Payer: Self-pay

## 2021-01-18 DIAGNOSIS — W19XXXA Unspecified fall, initial encounter: Secondary | ICD-10-CM | POA: Diagnosis not present

## 2021-01-18 DIAGNOSIS — N289 Disorder of kidney and ureter, unspecified: Secondary | ICD-10-CM

## 2021-01-18 DIAGNOSIS — Z79899 Other long term (current) drug therapy: Secondary | ICD-10-CM | POA: Diagnosis not present

## 2021-01-18 DIAGNOSIS — Z85528 Personal history of other malignant neoplasm of kidney: Secondary | ICD-10-CM | POA: Diagnosis not present

## 2021-01-18 DIAGNOSIS — R42 Dizziness and giddiness: Secondary | ICD-10-CM

## 2021-01-18 DIAGNOSIS — Z85118 Personal history of other malignant neoplasm of bronchus and lung: Secondary | ICD-10-CM | POA: Insufficient documentation

## 2021-01-18 DIAGNOSIS — H60331 Swimmer's ear, right ear: Secondary | ICD-10-CM | POA: Diagnosis not present

## 2021-01-18 DIAGNOSIS — Z9089 Acquired absence of other organs: Secondary | ICD-10-CM | POA: Diagnosis not present

## 2021-01-18 DIAGNOSIS — I10 Essential (primary) hypertension: Secondary | ICD-10-CM | POA: Diagnosis not present

## 2021-01-18 DIAGNOSIS — F17210 Nicotine dependence, cigarettes, uncomplicated: Secondary | ICD-10-CM | POA: Insufficient documentation

## 2021-01-18 LAB — BASIC METABOLIC PANEL WITH GFR
Anion gap: 7 (ref 5–15)
BUN: 36 mg/dL — ABNORMAL HIGH (ref 8–23)
CO2: 27 mmol/L (ref 22–32)
Calcium: 9.6 mg/dL (ref 8.9–10.3)
Chloride: 98 mmol/L (ref 98–111)
Creatinine, Ser: 1.85 mg/dL — ABNORMAL HIGH (ref 0.61–1.24)
GFR, Estimated: 38 mL/min — ABNORMAL LOW (ref >60)
Glucose, Bld: 96 mg/dL (ref 70–99)
Potassium: 5.3 mmol/L — ABNORMAL HIGH (ref 3.5–5.1)
Sodium: 132 mmol/L — ABNORMAL LOW (ref 135–145)

## 2021-01-18 LAB — CBC
HCT: 33.8 % — ABNORMAL LOW (ref 39.0–52.0)
Hemoglobin: 11.7 g/dL — ABNORMAL LOW (ref 13.0–17.0)
MCH: 32.3 pg (ref 26.0–34.0)
MCHC: 34.6 g/dL (ref 30.0–36.0)
MCV: 93.4 fL (ref 80.0–100.0)
Platelets: 185 K/uL (ref 150–400)
RBC: 3.62 MIL/uL — ABNORMAL LOW (ref 4.22–5.81)
RDW: 12.7 % (ref 11.5–15.5)
WBC: 7.3 K/uL (ref 4.0–10.5)
nRBC: 0 % (ref 0.0–0.2)

## 2021-01-18 LAB — URINALYSIS, ROUTINE W REFLEX MICROSCOPIC
Bilirubin Urine: NEGATIVE
Glucose, UA: NEGATIVE mg/dL
Hgb urine dipstick: NEGATIVE
Ketones, ur: NEGATIVE mg/dL
Leukocytes,Ua: NEGATIVE
Nitrite: NEGATIVE
Protein, ur: NEGATIVE mg/dL
Specific Gravity, Urine: 1.012 (ref 1.005–1.030)
pH: 7 (ref 5.0–8.0)

## 2021-01-18 LAB — CBG MONITORING, ED: Glucose-Capillary: 91 mg/dL (ref 70–99)

## 2021-01-18 NOTE — Discharge Instructions (Addendum)
1.  Eliminate all foods containing high levels of potassium.  Review discharge instructions.  Your potassium is just at the high side of normal.  High potassium can be dangerous.  Make sure that you are family doctor and your urologist are following this closely.  You should have a recheck in your potassium within the next couple of days.  Also, nephrologists manage problems with kidney function and blood pressure.  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 2.  Many of the symptoms he describes are suggestive of vertigo.  Discussed this with your family doctor.  You may benefit from referral to a neurologist. 3.  Return to the emergency department if you get any new worsening or changing or concerning symptoms.

## 2021-01-18 NOTE — ED Triage Notes (Signed)
Patient reports to the ER for dizziness following an inner ear infection that has since resolved. Patient reports he also hit his head on a window pane today and cracked the window.

## 2021-01-18 NOTE — ED Provider Notes (Signed)
[facility] EMERGENCY DEPT Provider Note   CSN: [id number] Arrival date & time: 01/18/21  1417     History Chief Complaint  Patient presents with   Fall   Head Injury    Dylan Harvey is a 70 y.o. male.  HPI Patient reports dizziness started about 3 weeks ago.  He reports he is having some episodes of feeling like things are moving, it lasts about 3 to 5 seconds.  Often it is associated with a position change.  Patient reports that nothing has made it better.  He has been diagnosed with an ear infection on the right.  He has been seeing ENT and doing drops in the ear.  He also had diagnosis of pneumonia about 3 weeks ago and took 7 to 10 days of antibiotics.  He reports all of those symptoms has resolved.  However, this morning he was leaning forward to lock a slide both on the [place] door and ended up falling forward striking the top of his head on the glass pane and breaking it.  He did not cut his head.  He denies loss of consciousness.  He reports that once he bent over he just seem to keep going and lost his balance.  No visual changes.  He reports this is happened about 2 other times over the past 3 weeks.  1 time he was walking and he reports all of a sudden he just basically fell forward and it was unprovoked.  He denies there was any palpitations chest pain shortness of breath.  There was no focal weakness numbness or tingling.  Patient has never been seen by neurology.  No history of stroke.    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 Gery Pray   History of radiation therapy 07/24/2018-07/31/2018   SBRT Left lung; Dr Gery Pray   Hyperlipemia    Hypertension    Left testicular pain    Lumbago     Neoplasm of kidney    Renal cancer [facility])    Renal cyst    Right testicular pain    Tobacco abuse     Patient Active Problem List   Diagnosis Date Noted   Renal mass 09/07/2020   Primary cancer of left lower lobe of lung ([place]) 07/14/2018   Hypertension 05/12/2018   Hyperlipidemia 05/12/2018   Tobacco abuse 05/12/2018   Arthritis 05/12/2018   Colon polyp 05/12/2018    Past Surgical History:  Procedure Laterality Date   COLONOSCOPY     FUDUCIAL PLACEMENT N/A 06/11/2018   Procedure: PLACEMENT OF FUDUCIAL;  Surgeon: Melrose Nakayama, MD;  Location: [facility];  Service: Thoracic;  Laterality: N/A;   HERNIA REPAIR     inguinal   INNER EAR SURGERY     POLYPECTOMY     ROBOT ASSITED LAPAROSCOPIC NEPHROURETERECTOMY Right 09/07/2020   Procedure: XI ROBOT ASSITED LAPAROSCOPIC NEPHROURETERECTOMY/ CYSTOSCOPY/ STENT PLACEMENT/ TRANSURETHRAL RESECTION OF URETERAL ORIFICE;  Surgeon: Ceasar Mons, MD;  Location: [facility];  Service: Urology;  Laterality: Right;   VIDEO BRONCHOSCOPY WITH ENDOBRONCHIAL NAVIGATION N/A 06/11/2018   Procedure: VIDEO BRONCHOSCOPY WITH ENDOBRONCHIAL NAVIGATION;  Surgeon: Melrose Nakayama, MD;  Location:  [facility];  Service: Thoracic;  Laterality: N/A;   WISDOM TOOTH EXTRACTION         Family History  Problem Relation Age of Onset   Alzheimer's disease Mother    Hypothyroidism Mother    Severe combined immunodeficiency Mother    Lung cancer Father    Prostate cancer Father    Colon cancer Neg Hx    Colon polyps Neg Hx    Esophageal cancer Neg Hx    Rectal cancer Neg Hx    Stomach cancer Neg Hx     Social History   Tobacco Use   Smoking status: Every Day    Packs/day: 1.00    Types: Cigarettes   Smokeless tobacco: Never  Substance Use Topics   Alcohol use: Yes    Alcohol/week: 2.0 standard drinks    Types: 2 Shots of liquor per week    Comment: occasionally   Drug use: No    Home Medications Prior to Admission medications   Medication  Sig Start Date End Date Taking? Authorizing Provider  ALFALFA PO Take by mouth daily.    [provider]  Ascorbic Acid (VITAMIN C) 1000 MG tablet Take 1,000 mg by mouth daily.    [provider]  b complex vitamins tablet Take 1 tablet by mouth daily.    [provider]  calcium carbonate (TUMS - DOSED IN MG ELEMENTAL CALCIUM) 500 MG chewable tablet Chew 1 tablet by mouth as needed for indigestion or heartburn.    [provider]  cholecalciferol (VITAMIN D) 1000 units tablet Take 1,000 Units by mouth daily.    [provider]  fexofenadine (ALLEGRA) 60 MG tablet Take 60 mg by mouth daily.    [provider]  Flaxseed, Linseed, (FLAX SEED OIL PO) Take by mouth daily.    [provider]  Nyoka Cowden Tea, Camellia sinensis, (GREEN TEA EXTRACT PO) Take by mouth daily.    [provider]  lisinopril (ZESTRIL) 10 MG tablet Take 10 mg by mouth daily. 06/12/19   [provider]  Omega-3 Fatty Acids (FISH OIL) 1000 MG CAPS Take by mouth daily.    [provider]  PRESCRIPTION MEDICATION Place 1 capsule into the right ear every other day. Sulfacetamide amphoteracin b - chloramphenincol - hydrocortisone    [provider]  PSYLLIUM FIBER PO Take by mouth daily.    [provider]  sodium chloride (OCEAN) 0.65 % SOLN nasal spray Place 1 spray into the nose daily.    [provider]  Zinc 100 MG TABS Take by mouth daily.    [provider]    Allergies    Neomycin-bacitracin zn-polymyx  Review of Systems   Review of Systems 10 systems reviewed and negative except as per HPI Physical Exam Updated Vital Signs BP (!) 159/77 (BP Location: Left Arm)   Pulse 73   Temp 98.1 F (36.7 C)   Resp 18   SpO2 100%   Physical Exam Constitutional:      Appearance: Normal appearance.  HENT:     Head: Normocephalic and atraumatic.     Ears:     Comments: Right ear canal has white, powdery  substance and scaling.  TM cannot be visualized.  Patient reports that that was placed in the ear by ENT.  Left ear canal normal with a thickened but nonerythematous or bulging TM.    Mouth/Throat:     Mouth: Mucous membranes are moist.     Pharynx: Oropharynx is  clear.  Eyes:     Extraocular Movements: Extraocular movements intact.     Conjunctiva/sclera: Conjunctivae normal.     Pupils: Pupils are equal, round, and reactive to light.  Cardiovascular:     Rate and Rhythm: Normal rate and regular rhythm.  Pulmonary:     Effort: Pulmonary effort is normal.     Breath sounds: Normal breath sounds.  Abdominal:     General: There is no distension.     Palpations: Abdomen is soft.     Tenderness: There is no abdominal tenderness. There is no guarding.  Musculoskeletal:        General: No swelling. Normal range of motion.     Cervical back: Neck supple.     Right lower leg: No edema.  Skin:    General: Skin is warm and dry.  Neurological:     General: No focal deficit present.     Mental Status: He is alert and oriented to person, place, and time.     Cranial Nerves: No cranial nerve deficit.     Sensory: No sensory deficit.     Motor: No weakness.     Coordination: Coordination normal.     Comments: Normal finger-nose exam bilaterally.  Symmetric sensory to light touch.  Motor strength 5\5.  Normal cognitive function.  Psychiatric:        Mood and Affect: Mood normal.    ED Results / Procedures / Treatments   Labs (all labs ordered are listed, but only abnormal results are displayed) Labs Reviewed  BASIC METABOLIC PANEL - Abnormal; Notable for the following components:      Result Value   Sodium 132 (*)    Potassium 5.3 (*)    BUN 36 (*)    Creatinine, Ser 1.85 (*)    GFR, Estimated 38 (*)    All other components within normal limits  CBC - Abnormal; Notable for the following components:   RBC 3.62 (*)    Hemoglobin 11.7 (*)    HCT 33.8 (*)    All other components within  normal limits  URINALYSIS, ROUTINE W REFLEX MICROSCOPIC  CBG MONITORING, ED    EKG EKG Interpretation  Date/Time:  Wednesday January 18 2021 15:00:00 EDT Ventricular Rate:  81 PR Interval:  134 QRS Duration: 124 QT Interval:  390 QTC Calculation: 453 R Axis:   87 Text Interpretation: Normal sinus rhythm Right bundle branch block Abnormal ECG no change from previous Confirmed by Charlesetta Shanks [phone]) on 01/18/2021 4:06:40 PM  Radiology CT HEAD WO CONTRAST  Result Date: 01/18/2021 CLINICAL DATA:  Dizziness. EXAM: CT HEAD WITHOUT CONTRAST TECHNIQUE: Contiguous axial images were obtained from the base of the skull through the vertex without intravenous contrast. COMPARISON:  March 19, 2012 FINDINGS: Brain: There is mild cerebral atrophy with widening of the extra-axial spaces and ventricular dilatation. There are areas of decreased attenuation within the white matter tracts of the supratentorial brain, consistent with microvascular disease changes. Vascular: No hyperdense vessel or unexpected calcification. Skull: Normal. Negative for fracture or focal lesion. Sinuses/Orbits: No acute finding. Other: None. IMPRESSION: 1. Generalized cerebral atrophy. 2. No acute intracranial abnormality. Electronically Signed   By: Virgina Norfolk M.D.   On: 01/18/2021 15:28    Procedures Procedures   Medications Ordered in ED Medications - No data to display  ED Course  I have reviewed the triage vital signs and the nursing notes.  Pertinent labs & imaging results that were available during my care of the patient  were reviewed by me and considered in my medical decision making (see chart for details).    MDM Rules/Calculators/A&P                           Patient presents with follow-up.  Symptoms suggestive of vertigo.  Patient describes several episodes of sudden imbalance and also perception of waves of certain type of movement.  Patient has had recent treatment for pneumonia and external  ear\middle ear infection.  Patient's exam is normal.  He does not have cognitive impairment or incoordination.  Symptoms sound more suggestive of vertigo from inner ear source.  CT head does not show any acute findings but shows some diffuse atrophy.  Patient does have mild AKI and top normal potassium.  Patient reports that he has 1 kidney.  He reports this week he was seen by [facility] and told to stop potassium supplement.  At this point, patient does not show any signs of being hypovolemic clinically.  Dizziness does sound much more vertiginous in quality.  I have reviewed these findings with the patient and advise he needs to follow-up closely with his PCP and urologist to monitor potassium and renal function.  I have also suggested considering consultation with nephrology if he is having change in renal function.  Patient is discharged in good condition.  Discharge instructions include return precautions and follow-up plan. Final Clinical Impression(s) / ED Diagnoses Final diagnoses:  Fall, initial encounter  Dizziness  Renal insufficiency    Rx / DC Orders ED Discharge Orders     None        Charlesetta Shanks, MD 01/18/21 1816

## 2021-01-19 DIAGNOSIS — I10 Essential (primary) hypertension: Secondary | ICD-10-CM | POA: Diagnosis not present

## 2021-01-19 DIAGNOSIS — C3490 Malignant neoplasm of unspecified part of unspecified bronchus or lung: Secondary | ICD-10-CM | POA: Diagnosis not present

## 2021-01-19 DIAGNOSIS — J449 Chronic obstructive pulmonary disease, unspecified: Secondary | ICD-10-CM | POA: Diagnosis not present

## 2021-01-19 DIAGNOSIS — E785 Hyperlipidemia, unspecified: Secondary | ICD-10-CM | POA: Diagnosis not present

## 2021-01-19 DIAGNOSIS — Z23 Encounter for immunization: Secondary | ICD-10-CM | POA: Diagnosis not present

## 2021-01-19 DIAGNOSIS — R42 Dizziness and giddiness: Secondary | ICD-10-CM | POA: Diagnosis not present

## 2021-01-19 DIAGNOSIS — J309 Allergic rhinitis, unspecified: Secondary | ICD-10-CM | POA: Diagnosis not present

## 2021-01-23 DIAGNOSIS — I10 Essential (primary) hypertension: Secondary | ICD-10-CM | POA: Diagnosis not present

## 2021-01-23 DIAGNOSIS — R42 Dizziness and giddiness: Secondary | ICD-10-CM | POA: Diagnosis not present

## 2021-01-23 DIAGNOSIS — H903 Sensorineural hearing loss, bilateral: Secondary | ICD-10-CM | POA: Diagnosis not present

## 2021-01-23 DIAGNOSIS — E785 Hyperlipidemia, unspecified: Secondary | ICD-10-CM | POA: Diagnosis not present

## 2021-01-23 DIAGNOSIS — Z125 Encounter for screening for malignant neoplasm of prostate: Secondary | ICD-10-CM | POA: Diagnosis not present

## 2021-01-25 DIAGNOSIS — E871 Hypo-osmolality and hyponatremia: Secondary | ICD-10-CM | POA: Diagnosis not present

## 2021-01-30 DIAGNOSIS — R82998 Other abnormal findings in urine: Secondary | ICD-10-CM | POA: Diagnosis not present

## 2021-02-01 DIAGNOSIS — F17200 Nicotine dependence, unspecified, uncomplicated: Secondary | ICD-10-CM | POA: Diagnosis not present

## 2021-02-01 DIAGNOSIS — C3490 Malignant neoplasm of unspecified part of unspecified bronchus or lung: Secondary | ICD-10-CM | POA: Diagnosis not present

## 2021-02-01 DIAGNOSIS — I2510 Atherosclerotic heart disease of native coronary artery without angina pectoris: Secondary | ICD-10-CM | POA: Diagnosis not present

## 2021-02-01 DIAGNOSIS — Z1331 Encounter for screening for depression: Secondary | ICD-10-CM | POA: Diagnosis not present

## 2021-02-01 DIAGNOSIS — Z0000 Encounter for general adult medical examination without abnormal findings: Secondary | ICD-10-CM | POA: Diagnosis not present

## 2021-02-01 DIAGNOSIS — E785 Hyperlipidemia, unspecified: Secondary | ICD-10-CM | POA: Diagnosis not present

## 2021-02-01 DIAGNOSIS — Z1212 Encounter for screening for malignant neoplasm of rectum: Secondary | ICD-10-CM | POA: Diagnosis not present

## 2021-02-01 DIAGNOSIS — Z1339 Encounter for screening examination for other mental health and behavioral disorders: Secondary | ICD-10-CM | POA: Diagnosis not present

## 2021-02-01 DIAGNOSIS — I10 Essential (primary) hypertension: Secondary | ICD-10-CM | POA: Diagnosis not present

## 2021-02-01 DIAGNOSIS — I7090 Unspecified atherosclerosis: Secondary | ICD-10-CM | POA: Diagnosis not present

## 2021-02-01 DIAGNOSIS — M1990 Unspecified osteoarthritis, unspecified site: Secondary | ICD-10-CM | POA: Diagnosis not present

## 2021-02-20 DIAGNOSIS — H7401 Tympanosclerosis, right ear: Secondary | ICD-10-CM | POA: Diagnosis not present

## 2021-02-20 DIAGNOSIS — H60391 Other infective otitis externa, right ear: Secondary | ICD-10-CM | POA: Diagnosis not present

## 2021-02-20 DIAGNOSIS — Z9089 Acquired absence of other organs: Secondary | ICD-10-CM | POA: Diagnosis not present

## 2021-02-20 DIAGNOSIS — H903 Sensorineural hearing loss, bilateral: Secondary | ICD-10-CM | POA: Insufficient documentation

## 2021-02-22 DIAGNOSIS — I10 Essential (primary) hypertension: Secondary | ICD-10-CM | POA: Diagnosis not present

## 2021-03-22 DIAGNOSIS — U071 COVID-19: Secondary | ICD-10-CM | POA: Diagnosis not present

## 2021-03-22 DIAGNOSIS — R051 Acute cough: Secondary | ICD-10-CM | POA: Diagnosis not present

## 2021-03-22 DIAGNOSIS — Z1152 Encounter for screening for COVID-19: Secondary | ICD-10-CM | POA: Diagnosis not present

## 2021-03-22 DIAGNOSIS — R0981 Nasal congestion: Secondary | ICD-10-CM | POA: Diagnosis not present

## 2021-03-22 DIAGNOSIS — R5081 Fever presenting with conditions classified elsewhere: Secondary | ICD-10-CM | POA: Diagnosis not present

## 2021-03-22 DIAGNOSIS — J449 Chronic obstructive pulmonary disease, unspecified: Secondary | ICD-10-CM | POA: Diagnosis not present

## 2021-03-22 DIAGNOSIS — J309 Allergic rhinitis, unspecified: Secondary | ICD-10-CM | POA: Diagnosis not present

## 2021-04-10 DIAGNOSIS — Z85528 Personal history of other malignant neoplasm of kidney: Secondary | ICD-10-CM | POA: Diagnosis not present

## 2021-04-10 DIAGNOSIS — N1832 Chronic kidney disease, stage 3b: Secondary | ICD-10-CM | POA: Diagnosis not present

## 2021-04-10 DIAGNOSIS — E785 Hyperlipidemia, unspecified: Secondary | ICD-10-CM | POA: Diagnosis not present

## 2021-04-10 DIAGNOSIS — I129 Hypertensive chronic kidney disease with stage 1 through stage 4 chronic kidney disease, or unspecified chronic kidney disease: Secondary | ICD-10-CM | POA: Diagnosis not present

## 2021-04-10 DIAGNOSIS — Z720 Tobacco use: Secondary | ICD-10-CM | POA: Diagnosis not present

## 2021-04-12 DIAGNOSIS — C651 Malignant neoplasm of right renal pelvis: Secondary | ICD-10-CM | POA: Diagnosis not present

## 2021-04-17 DIAGNOSIS — C651 Malignant neoplasm of right renal pelvis: Secondary | ICD-10-CM | POA: Diagnosis not present

## 2021-04-19 DIAGNOSIS — H60391 Other infective otitis externa, right ear: Secondary | ICD-10-CM | POA: Diagnosis not present

## 2021-04-19 DIAGNOSIS — Z9089 Acquired absence of other organs: Secondary | ICD-10-CM | POA: Diagnosis not present

## 2021-05-15 DIAGNOSIS — L298 Other pruritus: Secondary | ICD-10-CM | POA: Diagnosis not present

## 2021-05-15 DIAGNOSIS — L720 Epidermal cyst: Secondary | ICD-10-CM | POA: Diagnosis not present

## 2021-05-15 DIAGNOSIS — D3617 Benign neoplasm of peripheral nerves and autonomic nervous system of trunk, unspecified: Secondary | ICD-10-CM | POA: Diagnosis not present

## 2021-05-15 DIAGNOSIS — D1801 Hemangioma of skin and subcutaneous tissue: Secondary | ICD-10-CM | POA: Diagnosis not present

## 2021-05-15 DIAGNOSIS — D2271 Melanocytic nevi of right lower limb, including hip: Secondary | ICD-10-CM | POA: Diagnosis not present

## 2021-05-15 DIAGNOSIS — L570 Actinic keratosis: Secondary | ICD-10-CM | POA: Diagnosis not present

## 2021-05-15 DIAGNOSIS — L821 Other seborrheic keratosis: Secondary | ICD-10-CM | POA: Diagnosis not present

## 2021-05-16 ENCOUNTER — Telehealth: Payer: Self-pay | Admitting: *Deleted

## 2021-05-16 DIAGNOSIS — E785 Hyperlipidemia, unspecified: Secondary | ICD-10-CM | POA: Diagnosis not present

## 2021-05-16 DIAGNOSIS — J449 Chronic obstructive pulmonary disease, unspecified: Secondary | ICD-10-CM | POA: Diagnosis not present

## 2021-05-16 DIAGNOSIS — I2510 Atherosclerotic heart disease of native coronary artery without angina pectoris: Secondary | ICD-10-CM | POA: Diagnosis not present

## 2021-05-16 DIAGNOSIS — I10 Essential (primary) hypertension: Secondary | ICD-10-CM | POA: Diagnosis not present

## 2021-05-16 NOTE — Telephone Encounter (Signed)
CALLED PATIENT TO INFORM OF CT FOR 06-05-21- ARRIVAL TIME- 10:45 AM @ [facility], NO RESTRICTIONS TO TEST, PATIENT TO RECEIVE RESULTS FROM DR. KINARD ON 06-08-21 @ 11:30 AM , LVM FOR A RETURN CALL

## 2021-05-24 ENCOUNTER — Telehealth: Payer: Self-pay | Admitting: *Deleted

## 2021-05-24 NOTE — Telephone Encounter (Signed)
CALLED PATIENT TO INFORM THAT DR. KINARD WOULD BE GLAD TO SEE HIM FOR FU APPT. ON 06-07-21 @ 11 AM, LVM FOR A RETURN CALL ?

## 2021-05-31 DIAGNOSIS — H938X1 Other specified disorders of right ear: Secondary | ICD-10-CM | POA: Diagnosis not present

## 2021-05-31 DIAGNOSIS — Z8669 Personal history of other diseases of the nervous system and sense organs: Secondary | ICD-10-CM | POA: Diagnosis not present

## 2021-06-01 ENCOUNTER — Telehealth: Payer: Self-pay | Admitting: *Deleted

## 2021-06-01 NOTE — Telephone Encounter (Signed)
Returned patient's phone call, spoke with patient 

## 2021-06-05 ENCOUNTER — Ambulatory Visit (HOSPITAL_COMMUNITY)
Admission: RE | Admit: 2021-06-05 | Discharge: 2021-06-05 | Disposition: A | Discharge status: Home / Self Care | Payer: Medicare Other | Source: Ambulatory Visit | Attending: Radiation Oncology | Admitting: Radiation Oncology

## 2021-06-05 ENCOUNTER — Other Ambulatory Visit: Payer: Self-pay

## 2021-06-05 DIAGNOSIS — C3432 Malignant neoplasm of lower lobe, left bronchus or lung: Secondary | ICD-10-CM | POA: Insufficient documentation

## 2021-06-05 DIAGNOSIS — J811 Chronic pulmonary edema: Secondary | ICD-10-CM | POA: Diagnosis not present

## 2021-06-05 DIAGNOSIS — C3490 Malignant neoplasm of unspecified part of unspecified bronchus or lung: Secondary | ICD-10-CM | POA: Diagnosis not present

## 2021-06-05 DIAGNOSIS — R911 Solitary pulmonary nodule: Secondary | ICD-10-CM | POA: Diagnosis not present

## 2021-06-05 DIAGNOSIS — J90 Pleural effusion, not elsewhere classified: Secondary | ICD-10-CM | POA: Diagnosis not present

## 2021-06-05 DIAGNOSIS — J432 Centrilobular emphysema: Secondary | ICD-10-CM | POA: Diagnosis not present

## 2021-06-06 NOTE — Progress Notes (Signed)
?Radiation Oncology         [phone] ?________________________________ ? ?Name: Dylan Harvey MRN: [id number]  ?Date: 06/07/2021  DOB: January 14, 1951 ? ?Follow-Up Visit Note ? ?CC: Crist Infante, MD  Crist Infante, MD ? ?  ICD-10-CM   ?1. Primary cancer of left lower lobe of lung ([place])  C34.32 CT CHEST WO CONTRAST  ?  ? ? 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 ?Interval Since Last Radiation:  1 year, 4 months, and 2 weeks  ? ?Radiation Treatment Dates: 01/18/2020 through 01/25/2020 (new site) ?Site Technique Total Dose (Gy) Dose per Fx (Gy) Completed Fx Beam Energies  ?Lung, Left: Lung_Lt IMRT 54/54 18 3/3 6XFFF ?  ?   ? ? ? ?Radiation treatment dates:   07/24/2018, 07/28/2018, 07/31/2018 (first site) ? ?Site/dose:   The tumor in the LLL lung was treated with a course of stereotactic body radiation  ?treatment. The patient received 54 Gy in 3 fractions at 18 Gy per fraction. ? ?Narrative:  The patient returns today for routine follow-up and to review recent imaging, he was last seen for follow-up on 12/05/20. Since his last visit, the patient presented to the ED on 01/18/21 following a fall, and reported episodes of dizziness x 3 weeks.   He also had diagnosis of pneumonia about 3 weeks prior for which he took 7 -10 days of antibiotics. On arrival to the ED, the patient reported resolution of these symptoms and that the fall resulted from loss of balance. He also reported 2 other falls over the past 3 weeks due to the same issue. Following ED evaluation, his symptoms were noted as most suggestive of vertigo from an inner eat source. CT of the head did show some diffuse atrophy but was negative for any acute findings. He was discharged in stable condition with instructions to follow up with his PCP and ENT.           ? ?His most recent chest CT on 06/05/21 showed: development of smooth septal thickening and trace bilateral pleural effusions,  favoring pulmonary edema;  left perihilar radiation changes (felt to be slightly progressive and possibly accentuated by the more diffuse process); stable appearing posterior left upper lobe nodularity; and stable likely benign bilateral pulmonary nodules. No evidence of thoracic adenopathy was appreciated suggestive metastatic disease. ? ?Reports feeling well no new medical issues.  He continues to be active.  He denies any pain within the chest area significant cough or hemoptysis.  He has some mild dyspnea with exertion. ? ?Allergies:  is allergic to neomycin-bacitracin zn-polymyx. ? ?Meds: ?Current Outpatient Medications  ?Medication Sig Dispense Refill  ? ALFALFA PO Take by mouth daily.    ? amLODipine (NORVASC) 5 MG tablet Take 5 mg by mouth daily.    ? Ascorbic Acid (VITAMIN C) 1000 MG tablet Take 1,000 mg by mouth daily.    ? b complex vitamins tablet Take 1 tablet by mouth daily.    ? calcium carbonate (TUMS - DOSED IN MG ELEMENTAL CALCIUM) 500 MG chewable tablet Chew 1 tablet by mouth as needed for indigestion or heartburn.    ? cholecalciferol (VITAMIN D) 1000 units tablet Take 1,000 Units by mouth daily.    ? fexofenadine (ALLEGRA) 60 MG tablet Take 60 mg by mouth daily.    ? Flaxseed, Linseed, (FLAX SEED OIL PO) Take by mouth daily.    ? Green Tea, Camellia sinensis, (GREEN TEA EXTRACT  PO) Take by mouth daily.    ? Omega-3 Fatty Acids (FISH OIL) 1000 MG CAPS Take by mouth daily.    ? PRESCRIPTION MEDICATION Place 1 capsule into the right ear every other day. Sulfacetamide amphoteracin b - chloramphenincol - hydrocortisone    ? PSYLLIUM FIBER PO Take by mouth daily.    ? sodium chloride (OCEAN) 0.65 % SOLN nasal spray Place 1 spray into the nose daily.    ? Zinc 100 MG TABS Take by mouth daily.    ? ?No current facility-administered medications for this encounter.  ? ? ?Physical Findings: ?The patient is in no acute distress. Patient is alert and oriented. ? height is 6' (1.829 m) and weight is 154 lb  9.6 oz (70.1 kg). His temperature is 97.8 ?F (36.6 ?C). His blood pressure is 162/85 (abnormal) and his pulse is 40 (abnormal). His respiration is 18 and oxygen saturation is 100%. .  . Lungs are clear to auscultation bilaterally. Heart has regular rate and rhythm. No palpable cervical, supraclavicular, or axillary adenopathy. Abdomen soft, non-tender, normal bowel sounds.  ? ? ?Lab Findings: ?Lab Results  ?Component Value Date  ? WBC 7.3 01/18/2021  ? HGB 11.7 (L) 01/18/2021  ? HCT 33.8 (L) 01/18/2021  ? MCV 93.4 01/18/2021  ? PLT 185 01/18/2021  ? ? ?Radiographic Findings: ?CT CHEST WO CONTRAST ? ?Result Date: 06/06/2021 ?CLINICAL DATA:  Non-small-cell lung cancer diagnosed in 2021. Radiation therapy completed November 2021. Shortness of breath for 1 week. Evaluate treatment response. * Tracking Code: BO * EXAM: CT CHEST WITHOUT CONTRAST TECHNIQUE: Multidetector CT imaging of the chest was performed following the standard protocol without IV contrast. RADIATION DOSE REDUCTION: This exam was performed according to the departmental dose-optimization program which includes automated exposure control, adjustment of the mA and/or kV according to patient size and/or use of iterative reconstruction technique. COMPARISON:  11/30/2020 FINDINGS: Cardiovascular: Aortic atherosclerosis. Normal heart size, without pericardial effusion. Lad and left circumflex coronary artery calcification. Mediastinum/Nodes: No supraclavicular adenopathy. No mediastinal or definite hilar adenopathy, given limitations of unenhanced CT. Lungs/Pleura: Development of tiny bilateral pleural effusions, slightly larger on the left. Moderate centrilobular emphysema. Development of diffuse, symmetric, mild and smooth septal thickening. 3 mm right lower lobe pulmonary nodule on 105/5, similar. Again identified is left perihilar more focal interstitial thickening and architectural distortion. Felt to be slightly progressive compared to 11/30/2020 and  possibly accentuated by the more diffuse septal thickening. The posterior left upper lobe nodularity described on the prior exam measures 11 x 11 mm on 82/5 versus 13 x 10 mm on the prior exam, suggesting stability. Left lower lobe 9 mm nodule on 126/5 is felt to be similar to on the prior exam (when remeasured). A 3 mm left lower lobe pulmonary nodule on 115/5 is felt to be similar. Subpleural 3 mm left lower lobe nodule on 97/5 is unchanged. Upper Abdomen: Normal imaged portions of the liver, spleen, stomach, pancreas, adrenal glands, left kidney. Musculoskeletal: Deformity of the lateral right sixth rib, including on 84/2, is similar to on the prior and likely relates to remote trauma. There is also subtle right eighth posterolateral rib deformity which is likely due to remote fracture. IMPRESSION: 1. Development of smooth septal thickening and trace bilateral pleural effusions, favoring pulmonary edema. 2. Left perihilar radiation change, felt to be slightly progressive and possibly accentuated by the more diffuse process. The previously described posterior left upper lobe nodularity is unchanged, and there is no evidence of thoracic adenopathy to  suggest metastatic disease. 3. Ongoing stability of bilateral pulmonary nodules, favoring a benign etiology. 4. Aortic atherosclerosis (ICD10-I70.0), coronary artery atherosclerosis and emphysema (ICD10-J43.9). Electronically Signed   By: Abigail Miyamoto M.D.   On: 06/06/2021 14:09   ? 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 ?No evidence of recurrence on clinical exam today.  Recent chest CT scan very favorable concerning both of his treated areas. ? ?Plan: Routine follow-up in 6 months.  Prior to this follow-up appointment the patient will undergo a CT scan of the chest. ? ? ?20 minutes of total time was spent for this patient encounter, including preparation, face-to-face counseling with the  patient and coordination of care, physical exam, and documentation of the encounter. ?____________________________________ ? ?Blair Promise, PhD, MD ? ? ?This document serves as a record of services personal

## 2021-06-07 ENCOUNTER — Ambulatory Visit
Admission: RE | Admit: 2021-06-07 | Discharge: 2021-06-07 | Disposition: A | Discharge status: Home / Self Care | Payer: Medicare Other | Source: Ambulatory Visit | Attending: Radiation Oncology | Admitting: Radiation Oncology

## 2021-06-07 ENCOUNTER — Other Ambulatory Visit: Payer: Self-pay

## 2021-06-07 ENCOUNTER — Encounter: Payer: Self-pay | Admitting: Radiation Oncology

## 2021-06-07 DIAGNOSIS — J90 Pleural effusion, not elsewhere classified: Secondary | ICD-10-CM | POA: Insufficient documentation

## 2021-06-07 DIAGNOSIS — Z79899 Other long term (current) drug therapy: Secondary | ICD-10-CM | POA: Insufficient documentation

## 2021-06-07 DIAGNOSIS — I700 Atherosclerosis of aorta: Secondary | ICD-10-CM | POA: Diagnosis not present

## 2021-06-07 DIAGNOSIS — Z8709 Personal history of other diseases of the respiratory system: Secondary | ICD-10-CM | POA: Diagnosis not present

## 2021-06-07 DIAGNOSIS — J432 Centrilobular emphysema: Secondary | ICD-10-CM | POA: Insufficient documentation

## 2021-06-07 DIAGNOSIS — Z923 Personal history of irradiation: Secondary | ICD-10-CM | POA: Insufficient documentation

## 2021-06-07 DIAGNOSIS — I2510 Atherosclerotic heart disease of native coronary artery without angina pectoris: Secondary | ICD-10-CM | POA: Diagnosis not present

## 2021-06-07 DIAGNOSIS — C3432 Malignant neoplasm of lower lobe, left bronchus or lung: Secondary | ICD-10-CM | POA: Insufficient documentation

## 2021-06-07 DIAGNOSIS — Z08 Encounter for follow-up examination after completed treatment for malignant neoplasm: Secondary | ICD-10-CM | POA: Diagnosis not present

## 2021-06-07 NOTE — Progress Notes (Signed)
Dylan Harvey is here today for follow up post radiation to the lung. ? ?Lung Side: Left,patient  completed treatment on 01/25/20 ? ?Does the patient complain of any of the following: ?Pain:No ?Shortness of breath w/wo exertion: Yes, mostly on exertion. ?Cough: no ?Hemoptysis: no ?Pain with swallowing: no ?Swallowing/choking concerns: no ?Appetite: Good ?Weight: - ?Wt Readings from Last 3 Encounters:  ?06/07/21 154 lb 9.6 oz (70.1 kg)  ?12/05/20 151 lb 9.6 oz (68.8 kg)  ?09/28/20 142 lb 6.4 oz (64.6 kg)  ? ?Energy Level: Good energy level.  ?Post radiation skin Changes: no ? ? ? ?Additional comments if applicable:  ?Vitals:  ? 06/07/21 1103  ?BP: (!) 162/85  ?Pulse: (!) 40  ?Resp: 18  ?Temp: 97.8 ?F (36.6 ?C)  ?SpO2: 100%  ?Weight: 154 lb 9.6 oz (70.1 kg)  ?Height: 6' (1.829 m)  ?  ?

## 2021-06-08 ENCOUNTER — Ambulatory Visit: Payer: Self-pay | Admitting: Radiation Oncology

## 2021-06-12 ENCOUNTER — Ambulatory Visit (HOSPITAL_COMMUNITY)
Admission: RE | Admit: 2021-06-12 | Discharge: 2021-06-12 | Disposition: A | Discharge status: Home / Self Care | Payer: Medicare Other | Source: Ambulatory Visit | Attending: Urology | Admitting: Urology

## 2021-06-12 ENCOUNTER — Other Ambulatory Visit (HOSPITAL_COMMUNITY): Payer: Self-pay | Admitting: Urology

## 2021-06-12 ENCOUNTER — Other Ambulatory Visit: Payer: Self-pay

## 2021-06-12 DIAGNOSIS — R911 Solitary pulmonary nodule: Secondary | ICD-10-CM | POA: Diagnosis not present

## 2021-06-12 DIAGNOSIS — I10 Essential (primary) hypertension: Secondary | ICD-10-CM | POA: Diagnosis not present

## 2021-06-12 DIAGNOSIS — R079 Chest pain, unspecified: Secondary | ICD-10-CM | POA: Diagnosis not present

## 2021-06-12 DIAGNOSIS — C651 Malignant neoplasm of right renal pelvis: Secondary | ICD-10-CM | POA: Diagnosis not present

## 2021-06-14 DIAGNOSIS — I2510 Atherosclerotic heart disease of native coronary artery without angina pectoris: Secondary | ICD-10-CM | POA: Diagnosis not present

## 2021-06-14 DIAGNOSIS — J449 Chronic obstructive pulmonary disease, unspecified: Secondary | ICD-10-CM | POA: Diagnosis not present

## 2021-06-14 DIAGNOSIS — E871 Hypo-osmolality and hyponatremia: Secondary | ICD-10-CM | POA: Diagnosis not present

## 2021-06-14 DIAGNOSIS — R809 Proteinuria, unspecified: Secondary | ICD-10-CM | POA: Diagnosis not present

## 2021-06-14 DIAGNOSIS — I10 Essential (primary) hypertension: Secondary | ICD-10-CM | POA: Diagnosis not present

## 2021-06-14 DIAGNOSIS — I7090 Unspecified atherosclerosis: Secondary | ICD-10-CM | POA: Diagnosis not present

## 2021-06-14 DIAGNOSIS — C3490 Malignant neoplasm of unspecified part of unspecified bronchus or lung: Secondary | ICD-10-CM | POA: Diagnosis not present

## 2021-06-14 DIAGNOSIS — F17200 Nicotine dependence, unspecified, uncomplicated: Secondary | ICD-10-CM | POA: Diagnosis not present

## 2021-06-14 DIAGNOSIS — M1990 Unspecified osteoarthritis, unspecified site: Secondary | ICD-10-CM | POA: Diagnosis not present

## 2021-06-14 DIAGNOSIS — J90 Pleural effusion, not elsewhere classified: Secondary | ICD-10-CM | POA: Diagnosis not present

## 2021-06-14 DIAGNOSIS — E785 Hyperlipidemia, unspecified: Secondary | ICD-10-CM | POA: Diagnosis not present

## 2021-06-14 DIAGNOSIS — R0609 Other forms of dyspnea: Secondary | ICD-10-CM | POA: Diagnosis not present

## 2021-06-26 ENCOUNTER — Ambulatory Visit (HOSPITAL_COMMUNITY)
Admission: RE | Admit: 2021-06-26 | Discharge: 2021-06-26 | Disposition: A | Discharge status: Home / Self Care | Payer: Medicare Other | Source: Ambulatory Visit | Attending: Urology | Admitting: Urology

## 2021-06-26 ENCOUNTER — Other Ambulatory Visit (HOSPITAL_COMMUNITY): Payer: Self-pay | Admitting: Urology

## 2021-06-26 DIAGNOSIS — J439 Emphysema, unspecified: Secondary | ICD-10-CM | POA: Diagnosis not present

## 2021-06-26 DIAGNOSIS — C651 Malignant neoplasm of right renal pelvis: Secondary | ICD-10-CM

## 2021-06-28 DIAGNOSIS — K5730 Diverticulosis of large intestine without perforation or abscess without bleeding: Secondary | ICD-10-CM | POA: Diagnosis not present

## 2021-06-28 DIAGNOSIS — C651 Malignant neoplasm of right renal pelvis: Secondary | ICD-10-CM | POA: Diagnosis not present

## 2021-07-03 DIAGNOSIS — C651 Malignant neoplasm of right renal pelvis: Secondary | ICD-10-CM | POA: Diagnosis not present

## 2021-07-05 DIAGNOSIS — N1832 Chronic kidney disease, stage 3b: Secondary | ICD-10-CM | POA: Diagnosis not present

## 2021-07-05 DIAGNOSIS — N390 Urinary tract infection, site not specified: Secondary | ICD-10-CM | POA: Diagnosis not present

## 2021-07-11 ENCOUNTER — Encounter: Payer: Medicare Other | Admitting: Internal Medicine

## 2021-07-12 DIAGNOSIS — I129 Hypertensive chronic kidney disease with stage 1 through stage 4 chronic kidney disease, or unspecified chronic kidney disease: Secondary | ICD-10-CM | POA: Diagnosis not present

## 2021-07-12 DIAGNOSIS — Z720 Tobacco use: Secondary | ICD-10-CM | POA: Diagnosis not present

## 2021-07-12 DIAGNOSIS — N1832 Chronic kidney disease, stage 3b: Secondary | ICD-10-CM | POA: Diagnosis not present

## 2021-07-12 DIAGNOSIS — N390 Urinary tract infection, site not specified: Secondary | ICD-10-CM | POA: Diagnosis not present

## 2021-07-12 DIAGNOSIS — B952 Enterococcus as the cause of diseases classified elsewhere: Secondary | ICD-10-CM | POA: Diagnosis not present

## 2021-07-12 DIAGNOSIS — H60391 Other infective otitis externa, right ear: Secondary | ICD-10-CM | POA: Diagnosis not present

## 2021-07-12 DIAGNOSIS — E785 Hyperlipidemia, unspecified: Secondary | ICD-10-CM | POA: Diagnosis not present

## 2021-07-12 DIAGNOSIS — Z85528 Personal history of other malignant neoplasm of kidney: Secondary | ICD-10-CM | POA: Diagnosis not present

## 2021-08-28 DIAGNOSIS — Z9889 Other specified postprocedural states: Secondary | ICD-10-CM | POA: Diagnosis not present

## 2021-08-28 DIAGNOSIS — B368 Other specified superficial mycoses: Secondary | ICD-10-CM | POA: Diagnosis not present

## 2021-08-28 DIAGNOSIS — H6240 Otitis externa in other diseases classified elsewhere, unspecified ear: Secondary | ICD-10-CM | POA: Diagnosis not present

## 2021-10-02 DIAGNOSIS — E871 Hypo-osmolality and hyponatremia: Secondary | ICD-10-CM | POA: Diagnosis not present

## 2021-10-02 DIAGNOSIS — C3490 Malignant neoplasm of unspecified part of unspecified bronchus or lung: Secondary | ICD-10-CM | POA: Diagnosis not present

## 2021-10-02 DIAGNOSIS — R0609 Other forms of dyspnea: Secondary | ICD-10-CM | POA: Diagnosis not present

## 2021-10-02 DIAGNOSIS — I10 Essential (primary) hypertension: Secondary | ICD-10-CM | POA: Diagnosis not present

## 2021-10-02 DIAGNOSIS — J90 Pleural effusion, not elsewhere classified: Secondary | ICD-10-CM | POA: Diagnosis not present

## 2021-10-09 DIAGNOSIS — C651 Malignant neoplasm of right renal pelvis: Secondary | ICD-10-CM | POA: Diagnosis not present

## 2021-10-09 DIAGNOSIS — H6061 Unspecified chronic otitis externa, right ear: Secondary | ICD-10-CM | POA: Diagnosis not present

## 2021-11-13 ENCOUNTER — Telehealth: Payer: Self-pay | Admitting: *Deleted

## 2021-11-13 NOTE — Telephone Encounter (Signed)
CALLED PATIENT TO INFORM OF CT FOR 12-08-21- ARRIVAL TIME- 1 PM @ [facility], NO RESTRICTIONS TO TEST, PATIENT TO RECEIVE RESULTS FROM DR. KINARD ON 12-12-21 @ 11:30 AM, SPOKE WITH PATIENT'S WIFE- MARGARET AND SHE IS AWARE OF THESE APPTS. AND THE INSTRUCTIONS

## 2021-11-16 ENCOUNTER — Telehealth: Payer: Self-pay | Admitting: *Deleted

## 2021-11-16 NOTE — Telephone Encounter (Signed)
Called patient to inform that CT and fu appt. has been rescheduled, lvm for a return call

## 2021-11-23 ENCOUNTER — Telehealth: Payer: Self-pay | Admitting: *Deleted

## 2021-11-23 NOTE — Telephone Encounter (Signed)
RETURNED PATIENT'S PHONE CALL, SPOKE WITH PATIENT. ?

## 2021-11-27 DIAGNOSIS — Z9089 Acquired absence of other organs: Secondary | ICD-10-CM | POA: Diagnosis not present

## 2021-11-27 DIAGNOSIS — H60391 Other infective otitis externa, right ear: Secondary | ICD-10-CM | POA: Diagnosis not present

## 2021-11-29 DIAGNOSIS — H43813 Vitreous degeneration, bilateral: Secondary | ICD-10-CM | POA: Diagnosis not present

## 2021-11-29 DIAGNOSIS — H2513 Age-related nuclear cataract, bilateral: Secondary | ICD-10-CM | POA: Diagnosis not present

## 2021-11-29 DIAGNOSIS — H25013 Cortical age-related cataract, bilateral: Secondary | ICD-10-CM | POA: Diagnosis not present

## 2021-11-29 DIAGNOSIS — H25043 Posterior subcapsular polar age-related cataract, bilateral: Secondary | ICD-10-CM | POA: Diagnosis not present

## 2021-12-04 ENCOUNTER — Encounter (HOSPITAL_BASED_OUTPATIENT_CLINIC_OR_DEPARTMENT_OTHER): Payer: Self-pay

## 2021-12-04 ENCOUNTER — Emergency Department (HOSPITAL_BASED_OUTPATIENT_CLINIC_OR_DEPARTMENT_OTHER): Payer: Medicare Other

## 2021-12-04 ENCOUNTER — Other Ambulatory Visit: Payer: Self-pay

## 2021-12-04 ENCOUNTER — Emergency Department (HOSPITAL_BASED_OUTPATIENT_CLINIC_OR_DEPARTMENT_OTHER)
Admission: EM | Admit: 2021-12-04 | Discharge: 2021-12-04 | Disposition: A | Discharge status: Home / Self Care | Payer: Medicare Other | Attending: Emergency Medicine | Admitting: Emergency Medicine

## 2021-12-04 DIAGNOSIS — K5730 Diverticulosis of large intestine without perforation or abscess without bleeding: Secondary | ICD-10-CM | POA: Diagnosis not present

## 2021-12-04 DIAGNOSIS — I10 Essential (primary) hypertension: Secondary | ICD-10-CM | POA: Insufficient documentation

## 2021-12-04 DIAGNOSIS — R42 Dizziness and giddiness: Secondary | ICD-10-CM | POA: Insufficient documentation

## 2021-12-04 DIAGNOSIS — Z85528 Personal history of other malignant neoplasm of kidney: Secondary | ICD-10-CM | POA: Insufficient documentation

## 2021-12-04 DIAGNOSIS — Z85038 Personal history of other malignant neoplasm of large intestine: Secondary | ICD-10-CM | POA: Insufficient documentation

## 2021-12-04 DIAGNOSIS — N390 Urinary tract infection, site not specified: Secondary | ICD-10-CM | POA: Diagnosis not present

## 2021-12-04 DIAGNOSIS — N289 Disorder of kidney and ureter, unspecified: Secondary | ICD-10-CM | POA: Diagnosis not present

## 2021-12-04 DIAGNOSIS — R918 Other nonspecific abnormal finding of lung field: Secondary | ICD-10-CM | POA: Diagnosis not present

## 2021-12-04 LAB — CBC
HCT: 39.9 % (ref 39.0–52.0)
Hemoglobin: 14 g/dL (ref 13.0–17.0)
MCH: 32.5 pg (ref 26.0–34.0)
MCHC: 35.1 g/dL (ref 30.0–36.0)
MCV: 92.6 fL (ref 80.0–100.0)
Platelets: 181 10*3/uL (ref 150–400)
RBC: 4.31 MIL/uL (ref 4.22–5.81)
RDW: 13.2 % (ref 11.5–15.5)
WBC: 7.4 10*3/uL (ref 4.0–10.5)
nRBC: 0 % (ref 0.0–0.2)

## 2021-12-04 LAB — URINALYSIS, ROUTINE W REFLEX MICROSCOPIC
Bilirubin Urine: NEGATIVE
Glucose, UA: NEGATIVE mg/dL
Hgb urine dipstick: NEGATIVE
Ketones, ur: NEGATIVE mg/dL
Leukocytes,Ua: NEGATIVE
Nitrite: NEGATIVE
Specific Gravity, Urine: 1.018 (ref 1.005–1.030)
pH: 5.5 (ref 5.0–8.0)

## 2021-12-04 LAB — BASIC METABOLIC PANEL
Anion gap: 9 (ref 5–15)
BUN: 21 mg/dL (ref 8–23)
CO2: 27 mmol/L (ref 22–32)
Calcium: 10.4 mg/dL — ABNORMAL HIGH (ref 8.9–10.3)
Chloride: 97 mmol/L — ABNORMAL LOW (ref 98–111)
Creatinine, Ser: 1.34 mg/dL — ABNORMAL HIGH (ref 0.61–1.24)
GFR, Estimated: 56 mL/min — ABNORMAL LOW (ref >60)
Glucose, Bld: 137 mg/dL — ABNORMAL HIGH (ref 70–99)
Potassium: 4.8 mmol/L (ref 3.5–5.1)
Sodium: 133 mmol/L — ABNORMAL LOW (ref 135–145)

## 2021-12-04 LAB — TROPONIN I (HIGH SENSITIVITY)
Troponin I (High Sensitivity): 5 ng/L (ref <18)
Troponin I (High Sensitivity): 6 ng/L (ref <18)

## 2021-12-04 MED ORDER — LACTATED RINGERS IV BOLUS
1000.0000 mL | Freq: Once | INTRAVENOUS | Status: AC
  Administered 2021-12-04: 1000 mL via INTRAVENOUS

## 2021-12-04 MED ORDER — CEPHALEXIN 500 MG PO CAPS: 500.0000 mg | ORAL_CAPSULE | Freq: Two times a day (BID) | ORAL | 0 refills | Status: DC

## 2021-12-04 MED ORDER — CEPHALEXIN 250 MG PO CAPS
500.0000 mg | ORAL_CAPSULE | Freq: Once | ORAL | Status: AC
  Administered 2021-12-04: 500 mg via ORAL
  Filled 2021-12-04: qty 2

## 2021-12-04 MED ORDER — ACETAMINOPHEN 500 MG PO TABS
1000.0000 mg | ORAL_TABLET | Freq: Once | ORAL | Status: AC
  Administered 2021-12-04: 1000 mg via ORAL
  Filled 2021-12-04: qty 2

## 2021-12-04 MED ORDER — ALUM & MAG HYDROXIDE-SIMETH 200-200-20 MG/5ML PO SUSP
30.0000 mL | Freq: Once | ORAL | Status: AC
  Administered 2021-12-04: 30 mL via ORAL
  Filled 2021-12-04: qty 30

## 2021-12-04 MED ORDER — FAMOTIDINE 20 MG PO TABS
20.0000 mg | ORAL_TABLET | Freq: Once | ORAL | Status: AC
  Administered 2021-12-04: 20 mg via ORAL
  Filled 2021-12-04: qty 1

## 2021-12-04 MED ORDER — IOHEXOL 350 MG/ML SOLN
100.0000 mL | Freq: Once | INTRAVENOUS | Status: AC | PRN
  Administered 2021-12-04: 75 mL via INTRAVENOUS

## 2021-12-04 NOTE — ED Triage Notes (Signed)
Pt c/o dizziness, unsteadiness, and nauseated since 1700 yesterday. Pt states this started after a massage yesterday.

## 2021-12-04 NOTE — ED Provider Notes (Signed)
[facility] EMERGENCY DEPT Provider Note   CSN: [id number] Arrival date & time: 12/04/21  1351     History Chief Complaint  Patient presents with   Dizziness    HPI Dylan Harvey is a 71 y.o. male presenting for nonspecific dizziness.  He endorses episodes of lightheadedness over the past 72 hours.  He denies fevers or chills nausea vomiting syncope shortness of breath.  Endorses that he got a massage but denies any neck manipulation.  He describes symptoms as feeling that he is going to pass out and denies any vertiginous symptoms.  He is ambulatory tolerating p.o. intake on arrival.  Endorses decreased p.o. intake over the past week.  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  History of urinary tract infections.  Patient's recorded medical, surgical, social, medication list and allergies were reviewed in the Snapshot window as part of the initial history.   Review of Systems   Review of Systems  Constitutional:  Positive for fatigue. Negative for chills and fever.  HENT:  Negative for ear pain and sore throat.   Eyes:  Negative for pain and visual disturbance.  Respiratory:  Negative for cough and shortness of breath.   Cardiovascular:  Negative for chest pain and palpitations.  Gastrointestinal:  Negative for abdominal pain and vomiting.  Genitourinary:  Positive for frequency. Negative for dysuria and hematuria.  Musculoskeletal:  Negative for arthralgias and back pain.  Skin:  Negative for color change and rash.  Neurological:  Positive for weakness. Negative for seizures and syncope.  All other systems reviewed and are negative.   Physical Exam Updated Vital Signs BP (!) 169/86   Pulse 68   Temp 97.9 F (36.6 C)   Resp 16   Ht 6' (1.829 m)   Wt 71.7 kg   SpO2 100%   BMI 21.43 kg/m  Physical Exam Vitals and nursing note reviewed.  Constitutional:      General: He is not in acute distress.    Appearance: He is well-developed.  HENT:      Head: Normocephalic and atraumatic.  Eyes:     Conjunctiva/sclera: Conjunctivae normal.  Cardiovascular:     Rate and Rhythm: Normal rate and regular rhythm.     Heart sounds: No murmur heard. Pulmonary:     Effort: Pulmonary effort is normal. No respiratory distress.     Breath sounds: Normal breath sounds.  Abdominal:     Palpations: Abdomen is soft.     Tenderness: There is no abdominal tenderness.  Musculoskeletal:        General: No swelling.     Cervical back: Neck supple.  Skin:    General: Skin is warm and dry.     Capillary Refill: Capillary refill takes less than 2 seconds.  Neurological:     Mental Status: He is alert.  Psychiatric:        Mood and Affect: Mood normal.      ED Course/ Medical Decision Making/ A&P Clinical Course as of 12/04/21 2124  Mon Dec 04, 2021  2013 Labs [place] [CC]  2014 Cts ok Reassess [CC]    Clinical Course User Index [CC] Tretha Sciara, MD    Procedures Procedures   Medications Ordered in ED Medications  cephALEXin (KEFLEX) capsule 500 mg (has no administration in time range)  lactated ringers bolus 1,000 mL (0 mLs Intravenous Stopped 12/04/21 1925)  famotidine (PEPCID) tablet 20 mg (20 mg Oral Given 12/04/21 1750)  acetaminophen (TYLENOL) tablet 1,000 mg (1,000 mg  Oral Given 12/04/21 1749)  alum & mag hydroxide-simeth (MAALOX/MYLANTA) 200-200-20 MG/5ML suspension 30 mL (30 mLs Oral Given 12/04/21 1750)  iohexol (OMNIPAQUE) 350 MG/ML injection 100 mL (75 mLs Intravenous Contrast Given 12/04/21 1811)    Medical Decision Making:    Dylan Harvey is a 71 y.o. male who presented to the ED today with multiple concerns detailed above.     Patient's presentation is complicated by their history of multiple comorbid medical problems, history of colon cancer, renal cancer, hypertension, hyperlipidemia, multidrug outpatient medication regimen.  Patient placed on continuous vitals and telemetry monitoring while in ED which was reviewed  periodically.   Complete initial physical exam performed, notably the patient  was hemodynamically stable in no acute distress.  He endorses chest discomfort, abdominal discomfort.      Reviewed and confirmed nursing documentation for past medical history, family history, social history.    Initial Assessment:   With the patient's presentation of myalgias, fatigue, presyncope, chest pain, abdominal pain, patient is difficult to precisely pinpoint to single diagnosis.  Broad differential was considered including ACS, pulmonary embolism, aortic dissection, appendicitis, cholecystitis, pancreatitis, urinary tract infection, pyelonephritis, nephrolithiasis.  This is most consistent with an acute life/limb threatening illness complicated by underlying chronic conditions. I favor this likely is most consistent with mild urinary tract infection due to his history of similar and comorbid dehydration causing mild delirium. Initial Plan:  Broad radiographic evaluation including CTA PE study to evaluate for intrathoracic vascular etiology of patient's fatigue and presyncope.  Also will include runoff through abdomen pelvis due to comorbid abdominal pain that is diffuse in nature Screening labs including CBC and Metabolic panel to evaluate for infectious or metabolic etiology of disease.  Urinalysis with reflex culture ordered to evaluate for UTI or relevant urologic/nephrologic pathology.  Troponin and EKG to evaluate for cardiac pathology. Objective evaluation as below reviewed with plan for close reassessment after IV fluids  Initial Study Results:   Laboratory  All laboratory results reviewed without evidence of clinically relevant pathology.    EKG EKG was reviewed independently. Rate, rhythm, axis, intervals all examined and without medically relevant abnormality. ST segments without concerns for elevations.    Radiology  All images reviewed independently. Agree with radiology report at this time.    CT ABDOMEN PELVIS W CONTRAST  Result Date: 12/04/2021 CLINICAL DATA:  Abdominal pain, acute, nonlocalized History of kidney cancer. EXAM: CT ABDOMEN AND PELVIS WITH CONTRAST TECHNIQUE: Multidetector CT imaging of the abdomen and pelvis was performed using the standard protocol following bolus administration of intravenous contrast. RADIATION DOSE REDUCTION: This exam was performed according to the departmental dose-optimization program which includes automated exposure control, adjustment of the mA and/or kV according to patient size and/or use of iterative reconstruction technique. CONTRAST:  68mL OMNIPAQUE IOHEXOL 350 MG/ML SOLN COMPARISON:  Noncontrast CT 06/28/2021 FINDINGS: Lower chest: Assessed on concurrent chest CT, reported separately. Hepatobiliary: No focal liver abnormality. Unremarkable gallbladder. No biliary dilatation. Pancreas: No ductal dilatation or inflammation. Spleen: Normal in size without focal abnormality. Adrenals/Urinary Tract: No adrenal nodule. Right nephrectomy, no abnormal soft tissue density in the nephrectomy bed. There is no left hydronephrosis. An 8 mm exophytic lesion from the inferior left kidney, best appreciated on coronal series 5, image 50, corresponded to a hyperdense lesion on prior exam, too small to characterize but likely a benign hyperdense cyst. Recommend attention at follow-up oncologic imaging. No renal calculi. Equivocal posterior bladder wall thickening, bladder is mildly distended. No perivesicular fat stranding. Stomach/Bowel:  Detailed bowel assessment limited in the absence of enteric contrast. The stomach is mildly distended with ingested material. No gastric wall thickening. No evidence of bowel obstruction or inflammatory change. Colonic diverticulosis is prominent involving the sigmoid colon. There is no diverticulitis. Small to moderate colonic stool burden. The appendix is not confidently visualized. Vascular/Lymphatic: Moderate aortic atherosclerosis.  No aortic aneurysm. Calcified lymph nodes in the upper abdomen consistent with prior granulomatous disease, stable from prior exams. No suspicious abdominopelvic adenopathy. Reproductive: Enlarged prostate gland again seen. Other: No free air or ascites. Minimal fat in the right inguinal canal. There is a tiny fat containing umbilical hernia. Musculoskeletal: Mild scoliosis with multilevel degenerative change in the spine. There are no acute or suspicious osseous abnormalities. IMPRESSION: 1. Equivocal posterior bladder wall thickening, can be seen with urinary tract infection. Recommend correlation with urinalysis. 2. Tiny subcentimeter hyperdense lesion arising from the lower left kidney. This corresponds to a small hyperdense lesion on prior noncontrast CT and favors a benign hyperdense cyst and is unchanged. Recommend attention at follow-up oncologic imaging. 3. Colonic diverticulosis without diverticulitis. 4. Right nephrectomy without evidence of recurrent or metastatic disease in the abdomen/pelvis. 5. Enlarged prostate gland. Aortic Atherosclerosis (ICD10-I70.0). Electronically Signed   By: Keith Rake M.D.   On: 12/04/2021 18:51   CT Angio Chest PE W and/or Wo Contrast  Result Date: 12/04/2021 CLINICAL DATA:  Pulmonary embolism (PE) suspected, high prob Patient reports dizziness, unsteadiness and nausea. History of emphysema. History of renal cancer. Radiologic records indicates history of lung cancer. EXAM: CT ANGIOGRAPHY CHEST WITH CONTRAST TECHNIQUE: Multidetector CT imaging of the chest was performed using the standard protocol during bolus administration of intravenous contrast. Multiplanar CT image reconstructions and MIPs were obtained to evaluate the vascular anatomy. RADIATION DOSE REDUCTION: This exam was performed according to the departmental dose-optimization program which includes automated exposure control, adjustment of the mA and/or kV according to patient size and/or use of  iterative reconstruction technique. CONTRAST:  6mL OMNIPAQUE IOHEXOL 350 MG/ML SOLN COMPARISON:  Radiograph 06/26/2021.  Chest CT 06/05/2021 FINDINGS: Cardiovascular: There are no filling defects within the pulmonary arteries to suggest pulmonary embolus. Mild atherosclerosis of the thoracic aorta. No aneurysm or acute aortic findings. There are coronary artery calcifications. Mitral annulus calcifications. The heart is normal in size. No pericardial effusion. Mediastinum/Nodes: Distal esophageal wall thickening. There are scattered small mediastinal lymph nodes, none of which are enlarged by size criteria, and stable from prior exam. No thyroid nodule. No axillary adenopathy. Lungs/Pleura: There previous small bilateral pleural effusions have resolved. Moderate emphysema. Left perihilar interstitial thickening and architectural distortion, similar in appearance to prior exam. The previous right lower lobe pulmonary nodule is slightly smaller currently measuring 2 mm, series 6, image 108, previously 3 mm. 2-3 mm subpleural left lower lobe nodule series 6, image 92, unchanged. 3 mm left lower lobe nodule series 6, image 108, unchanged. Decreased size of left lower lobe nodule currently measuring 7 mm, series 6, image 115, previously 9 mm. The previous perihilar left upper lobe nodule is not well-defined on the current exam, although this may be due to slice selection on motion. No evidence of new pulmonary nodule or acute airspace disease. Upper Abdomen: Assessed on concurrent abdominal CT, reported separately. Musculoskeletal: There are no acute or suspicious osseous abnormalities. Review of the MIP images confirms the above findings. IMPRESSION: 1. No pulmonary embolus. 2. Postradiation change in the left perihilar region. Small bilateral pulmonary nodules are unchanged to decreased in size from prior  exam, favoring a benign etiology. Follow-up per oncologic protocol. 3. Previous small bilateral pleural effusions  have resolved. 4. Mild distal esophageal wall thickening, can be seen with reflux or esophagitis. Aortic Atherosclerosis (ICD10-I70.0) and Emphysema (ICD10-J43.9). Electronically Signed   By: Keith Rake M.D.   On: 12/04/2021 18:42      Final Assessment and Plan:   On reassessment, patient is grossly symptomatically resolved.  He denies fevers chills nausea vomiting syncope shortness of breath.  He is ambulated to the restroom in no acute distress.  He now endorses dysuria and urinary frequency.  He endorses a history of urinary tract infections and states that now that he thinks about it this feels very similar.  No flank pain or tenderness.  He is ambulatory tolerating p.o. intake now.  His wife came out of his room and states that he feels better would like to be discharged.  His wife states that historically due to his single kidney, they have elected to treat him when he has symptoms even if his urine is not grossly infected.  He has follow-up with his urologist scheduled later this week.  Ultimately, due to patient preference and CT findings with possible UTI, I will initiate antibiotic therapy and recommend close follow-up with PCP and follow-up of urine culture.   Disposition:  Based on the above findings, I believe patient is stable for discharge.    Patient/family educated about specific return precautions for given chief complaint and symptoms.  Patient/family educated about follow-up with PCP.    He does have multiple CT abnormalities and reads were placed in his paperwork and importance of follow-up with PCP to ensure no malignancy reinforced.  Patient/family expressed understanding of return precautions and need for follow-up. Patient spoken to regarding all imaging and laboratory results and appropriate follow up for these results. All education provided in verbal form with additional information in written form. Time was allowed for answering of patient questions. Patient  discharged.    Emergency Department Medication Summary:   Medications  cephALEXin (KEFLEX) capsule 500 mg (has no administration in time range)  lactated ringers bolus 1,000 mL (0 mLs Intravenous Stopped 12/04/21 1925)  famotidine (PEPCID) tablet 20 mg (20 mg Oral Given 12/04/21 1750)  acetaminophen (TYLENOL) tablet 1,000 mg (1,000 mg Oral Given 12/04/21 1749)  alum & mag hydroxide-simeth (MAALOX/MYLANTA) 200-200-20 MG/5ML suspension 30 mL (30 mLs Oral Given 12/04/21 1750)  iohexol (OMNIPAQUE) 350 MG/ML injection 100 mL (75 mLs Intravenous Contrast Given 12/04/21 1811)         Clinical Impression:  1. Lightheaded   2. Urinary tract infection without hematuria, site unspecified      Discharge   Final Clinical Impression(s) / ED Diagnoses Final diagnoses:  Lightheaded  Urinary tract infection without hematuria, site unspecified    Rx / DC Orders ED Discharge Orders          Ordered    cephALEXin (KEFLEX) 500 MG capsule  2 times daily        12/04/21 2054              Tretha Sciara, MD 12/04/21 2125

## 2021-12-04 NOTE — Discharge Instructions (Addendum)
CT ABDOMEN PELVIS W CONTRAST  Result Date: 12/04/2021 CLINICAL DATA:  Abdominal pain, acute, nonlocalized History of kidney cancer. EXAM: CT ABDOMEN AND PELVIS WITH CONTRAST TECHNIQUE: Multidetector CT imaging of the abdomen and pelvis was performed using the standard protocol following bolus administration of intravenous contrast. RADIATION DOSE REDUCTION: This exam was performed according to the departmental dose-optimization program which includes automated exposure control, adjustment of the mA and/or kV according to patient size and/or use of iterative reconstruction technique. CONTRAST:  103mL OMNIPAQUE IOHEXOL 350 MG/ML SOLN COMPARISON:  Noncontrast CT 06/28/2021 FINDINGS: Lower chest: Assessed on concurrent chest CT, reported separately. Hepatobiliary: No focal liver abnormality. Unremarkable gallbladder. No biliary dilatation. Pancreas: No ductal dilatation or inflammation. Spleen: Normal in size without focal abnormality. Adrenals/Urinary Tract: No adrenal nodule. Right nephrectomy, no abnormal soft tissue density in the nephrectomy bed. There is no left hydronephrosis. An 8 mm exophytic lesion from the inferior left kidney, best appreciated on coronal series 5, image 50, corresponded to a hyperdense lesion on prior exam, too small to characterize but likely a benign hyperdense cyst. Recommend attention at follow-up oncologic imaging. No renal calculi. Equivocal posterior bladder wall thickening, bladder is mildly distended. No perivesicular fat stranding. Stomach/Bowel: Detailed bowel assessment limited in the absence of enteric contrast. The stomach is mildly distended with ingested material. No gastric wall thickening. No evidence of bowel obstruction or inflammatory change. Colonic diverticulosis is prominent involving the sigmoid colon. There is no diverticulitis. Small to moderate colonic stool burden. The appendix is not confidently visualized. Vascular/Lymphatic: Moderate aortic atherosclerosis.  No aortic aneurysm. Calcified lymph nodes in the upper abdomen consistent with prior granulomatous disease, stable from prior exams. No suspicious abdominopelvic adenopathy. Reproductive: Enlarged prostate gland again seen. Other: No free air or ascites. Minimal fat in the right inguinal canal. There is a tiny fat containing umbilical hernia. Musculoskeletal: Mild scoliosis with multilevel degenerative change in the spine. There are no acute or suspicious osseous abnormalities. IMPRESSION: 1. Equivocal posterior bladder wall thickening, can be seen with urinary tract infection. Recommend correlation with urinalysis. 2. Tiny subcentimeter hyperdense lesion arising from the lower left kidney. This corresponds to a small hyperdense lesion on prior noncontrast CT and favors a benign hyperdense cyst and is unchanged. Recommend attention at follow-up oncologic imaging. 3. Colonic diverticulosis without diverticulitis. 4. Right nephrectomy without evidence of recurrent or metastatic disease in the abdomen/pelvis. 5. Enlarged prostate gland. Aortic Atherosclerosis (ICD10-I70.0). Electronically Signed   By: Keith Rake M.D.   On: 12/04/2021 18:51   CT Angio Chest PE W and/or Wo Contrast  Result Date: 12/04/2021 CLINICAL DATA:  Pulmonary embolism (PE) suspected, high prob Patient reports dizziness, unsteadiness and nausea. History of emphysema. History of renal cancer. Radiologic records indicates history of lung cancer. EXAM: CT ANGIOGRAPHY CHEST WITH CONTRAST TECHNIQUE: Multidetector CT imaging of the chest was performed using the standard protocol during bolus administration of intravenous contrast. Multiplanar CT image reconstructions and MIPs were obtained to evaluate the vascular anatomy. RADIATION DOSE REDUCTION: This exam was performed according to the departmental dose-optimization program which includes automated exposure control, adjustment of the mA and/or kV according to patient size and/or use of  iterative reconstruction technique. CONTRAST:  88mL OMNIPAQUE IOHEXOL 350 MG/ML SOLN COMPARISON:  Radiograph 06/26/2021.  Chest CT 06/05/2021 FINDINGS: Cardiovascular: There are no filling defects within the pulmonary arteries to suggest pulmonary embolus. Mild atherosclerosis of the thoracic aorta. No aneurysm or acute aortic findings. There are coronary artery calcifications. Mitral annulus calcifications. The heart is normal  in size. No pericardial effusion. Mediastinum/Nodes: Distal esophageal wall thickening. There are scattered small mediastinal lymph nodes, none of which are enlarged by size criteria, and stable from prior exam. No thyroid nodule. No axillary adenopathy. Lungs/Pleura: There previous small bilateral pleural effusions have resolved. Moderate emphysema. Left perihilar interstitial thickening and architectural distortion, similar in appearance to prior exam. The previous right lower lobe pulmonary nodule is slightly smaller currently measuring 2 mm, series 6, image 108, previously 3 mm. 2-3 mm subpleural left lower lobe nodule series 6, image 92, unchanged. 3 mm left lower lobe nodule series 6, image 108, unchanged. Decreased size of left lower lobe nodule currently measuring 7 mm, series 6, image 115, previously 9 mm. The previous perihilar left upper lobe nodule is not well-defined on the current exam, although this may be due to slice selection on motion. No evidence of new pulmonary nodule or acute airspace disease. Upper Abdomen: Assessed on concurrent abdominal CT, reported separately. Musculoskeletal: There are no acute or suspicious osseous abnormalities. Review of the MIP images confirms the above findings. IMPRESSION: 1. No pulmonary embolus. 2. Postradiation change in the left perihilar region. Small bilateral pulmonary nodules are unchanged to decreased in size from prior exam, favoring a benign etiology. Follow-up per oncologic protocol. 3. Previous small bilateral pleural effusions  have resolved. 4. Mild distal esophageal wall thickening, can be seen with reflux or esophagitis. Aortic Atherosclerosis (ICD10-I70.0) and Emphysema (ICD10-J43.9). Electronically Signed   By: Keith Rake M.D.   On: 12/04/2021 18:42

## 2021-12-06 DIAGNOSIS — E785 Hyperlipidemia, unspecified: Secondary | ICD-10-CM | POA: Diagnosis not present

## 2021-12-06 DIAGNOSIS — R42 Dizziness and giddiness: Secondary | ICD-10-CM | POA: Diagnosis not present

## 2021-12-06 DIAGNOSIS — F17200 Nicotine dependence, unspecified, uncomplicated: Secondary | ICD-10-CM | POA: Diagnosis not present

## 2021-12-06 DIAGNOSIS — I10 Essential (primary) hypertension: Secondary | ICD-10-CM | POA: Diagnosis not present

## 2021-12-06 DIAGNOSIS — E871 Hypo-osmolality and hyponatremia: Secondary | ICD-10-CM | POA: Diagnosis not present

## 2021-12-06 DIAGNOSIS — K2100 Gastro-esophageal reflux disease with esophagitis, without bleeding: Secondary | ICD-10-CM | POA: Diagnosis not present

## 2021-12-06 DIAGNOSIS — N390 Urinary tract infection, site not specified: Secondary | ICD-10-CM | POA: Diagnosis not present

## 2021-12-06 DIAGNOSIS — I2510 Atherosclerotic heart disease of native coronary artery without angina pectoris: Secondary | ICD-10-CM | POA: Diagnosis not present

## 2021-12-08 ENCOUNTER — Ambulatory Visit (HOSPITAL_COMMUNITY): Payer: Medicare Other

## 2021-12-11 ENCOUNTER — Ambulatory Visit (HOSPITAL_COMMUNITY)
Admission: RE | Admit: 2021-12-11 | Discharge: 2021-12-11 | Disposition: A | Discharge status: Home / Self Care | Payer: Medicare Other | Source: Ambulatory Visit | Attending: Radiation Oncology | Admitting: Radiation Oncology

## 2021-12-11 DIAGNOSIS — C3432 Malignant neoplasm of lower lobe, left bronchus or lung: Secondary | ICD-10-CM | POA: Insufficient documentation

## 2021-12-11 DIAGNOSIS — C3490 Malignant neoplasm of unspecified part of unspecified bronchus or lung: Secondary | ICD-10-CM | POA: Diagnosis not present

## 2021-12-11 DIAGNOSIS — R911 Solitary pulmonary nodule: Secondary | ICD-10-CM | POA: Diagnosis not present

## 2021-12-12 ENCOUNTER — Telehealth: Payer: Self-pay

## 2021-12-12 ENCOUNTER — Ambulatory Visit: Payer: Self-pay | Admitting: Radiation Oncology

## 2021-12-12 NOTE — Telephone Encounter (Signed)
        Patient  visited [place] on 9/18   Telephone encounter attempt :  1st  Unable to leave a message    [place]  [phone] [address], [place], [place] [address] Phone: [phone] Email: [email or web address]

## 2021-12-17 NOTE — Progress Notes (Signed)
Radiation Oncology         [phone] ________________________________  Name: Dylan Harvey MRN: [id number]  Date: 12/18/2021  DOB: [birth year]  Follow-Up Visit Note  CC: Crist Infante, MD  Crist Infante, MD  No diagnosis found.  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 The patient received 54 Gy in 3 fractions at 18 Gy per fraction.  Narrative:  The patient returns today for routine follow-up and to review recent imaging, he was last seen here for follow-up on 06/07/21. Since his last visit, the patient presented to the ED on 12/04/21 with lightheadedness and decreased p.o intake over the prior week. CTA of the chest performed showed no evidence of PE and post-radiation changes in the left perihilar region. CT also showed the bilateral pulmonary nodules as stable in size to decreased in size in the interval, favoring benign etiology, resolution of previously seen small bilateral pleural effusions, and mild distal esophageal wall thickening. CT of the abdomen and pelvis also performed showed: equivocal posterior bladder wall thickening typically indicative of UTI, a tiny subcentimeter hyperdense lesion arising from the lower left Kidney corresponding to a small hyperdense lesion seen on a prior non-contrast CT, and findings consistent with past right nephrectomy without evidence of recurrent or metastatic disease. Given no evidence of acute pathology on imaging, and  negative lab work up, the patient was discharged home and informed of return precautions of his symptoms return.             His most recent chest CT without contrast on 12/11/21 showed stable perihilar post radiation changes in the left lung and no change in the 3 left lower lobe pulmonary nodules from prior imaging favoring benign nodularities.      ***             Allergies:  is allergic to neomycin-bacitracin zn-polymyx.  Meds: Current Outpatient Medications  Medication Sig Dispense Refill   ALFALFA PO Take by mouth daily.     amLODipine (NORVASC) 5 MG tablet Take 5 mg by mouth daily.     Ascorbic Acid (VITAMIN C) 1000 MG tablet Take 1,000 mg by mouth daily.     b complex vitamins tablet Take 1 tablet by mouth daily.     calcium carbonate (TUMS - DOSED IN MG ELEMENTAL CALCIUM) 500 MG chewable tablet Chew 1 tablet by mouth as needed for indigestion or heartburn.     cephALEXin (KEFLEX) 500 MG capsule Take 1 capsule (500 mg total) by mouth 2 (two) times daily. 10 capsule 0   cholecalciferol (VITAMIN D) 1000 units tablet Take 1,000 Units by mouth daily.     fexofenadine (ALLEGRA) 60 MG tablet Take 60 mg by mouth daily.     Flaxseed, Linseed, (FLAX SEED OIL PO) Take by mouth daily.     Green Tea, Camellia sinensis, (GREEN TEA EXTRACT PO) Take by mouth daily.     Omega-3 Fatty Acids (FISH  OIL) 1000 MG CAPS Take by mouth daily.     PRESCRIPTION MEDICATION Place 1 capsule into the right ear every other day. Sulfacetamide amphoteracin b - chloramphenincol - hydrocortisone     PSYLLIUM FIBER PO Take by mouth daily.     sodium chloride (OCEAN) 0.65 % SOLN nasal spray Place 1 spray into the nose daily.     Zinc 100 MG TABS Take by mouth daily.     No current facility-administered medications for this encounter.    Physical Findings: The patient is in no acute distress. Patient is alert and oriented.  vitals were not taken for this visit. .  No significant changes. Lungs are clear to  auscultation bilaterally. Heart has regular rate and rhythm. No palpable cervical, supraclavicular, or axillary adenopathy. Abdomen soft, non-tender, normal bowel sounds.   Lab Findings: Lab Results  Component Value Date   WBC 7.4 12/04/2021   HGB 14.0 12/04/2021   HCT 39.9 12/04/2021   MCV 92.6 12/04/2021   PLT 181 12/04/2021    Radiographic Findings: CT CHEST WO CONTRAST  Result Date: 12/12/2021 CLINICAL DATA:  Non-small cell lung cancer. Assess treatment response. Post radiation therapy. * Tracking Code: BO * EXAM: CT CHEST WITHOUT CONTRAST TECHNIQUE: Multidetector CT imaging of the chest was performed following the standard protocol without IV contrast. RADIATION DOSE REDUCTION: This exam was performed according to the departmental dose-optimization program which includes automated exposure control, adjustment of the mA and/or kV according to patient size and/or use of iterative reconstruction technique. COMPARISON:  CT chest 12/04/2021, CT 11/30/2020 FINDINGS: Cardiovascular: Coronary artery calcification and aortic atherosclerotic calcification. Mediastinum/Nodes: No axillary or supraclavicular adenopathy. No mediastinal or hilar adenopathy. No pericardial fluid. Esophagus normal. Lungs/Pleura: Flattened band of parenchymal consolidation/scarring in the perihilar LEFT lung is not changed from comparison CT 11/24/2021. Fiducial markers in the superior segment LEFT lower lobe. No new or suspicious nodularity in LEFT lung. Small subpleural nodule in the LEFT lower lobe measuring 3 mm (image 98/7) is unchanged. Small LEFT lower lobe nodule measuring 3 mm within the parenchyma on image 117/7) is unchanged. Nodular inter fissural thickening in the more inferior LEFT lower lobe measures 7 mm (image 130/7) unchanged. Mild centrilobular emphysema the upper lobes. No RIGHT lung suspicious nodularity. Upper Abdomen: Limited view of the liver, LEFT kidney, pancreas are unremarkable. Normal adrenal glands.  RIGHT nephrectomy. Musculoskeletal: No aggressive osseous lesion. IMPRESSION: 1. Stable perihilar post radiation change in the LEFT lung. 2. Three LEFT lower lobe pulmonary nodules are unchanged from prior. Favor benign nodularity. 3. RIGHT nephrectomy. Electronically Signed   By: Suzy Bouchard M.D.   On: 12/12/2021 11:02   CT ABDOMEN PELVIS W CONTRAST  Result Date: 12/04/2021 CLINICAL DATA:  Abdominal pain, acute, nonlocalized History of kidney cancer. EXAM: CT ABDOMEN AND PELVIS WITH CONTRAST TECHNIQUE: Multidetector CT imaging of the abdomen and pelvis was performed using the standard protocol following bolus administration of intravenous contrast. RADIATION DOSE REDUCTION: This exam was performed according to the departmental dose-optimization program which includes automated exposure control, adjustment of the mA and/or kV according to patient size and/or use of iterative reconstruction technique. CONTRAST:  76mL OMNIPAQUE IOHEXOL 350 MG/ML SOLN COMPARISON:  Noncontrast CT 06/28/2021 FINDINGS: Lower chest: Assessed on concurrent chest CT, reported separately. Hepatobiliary: No focal liver abnormality. Unremarkable gallbladder. No biliary dilatation. Pancreas: No ductal dilatation or inflammation. Spleen: Normal in size without focal abnormality. Adrenals/Urinary Tract: No adrenal nodule. Right nephrectomy, no abnormal soft tissue density in the nephrectomy bed. There is  no left hydronephrosis. An 8 mm exophytic lesion from the inferior left kidney, best appreciated on coronal series 5, image 50, corresponded to a hyperdense lesion on prior exam, too small to characterize but likely a benign hyperdense cyst. Recommend attention at follow-up oncologic imaging. No renal calculi. Equivocal posterior bladder wall thickening, bladder is mildly distended. No perivesicular fat stranding. Stomach/Bowel: Detailed bowel assessment limited in the absence of enteric contrast. The stomach is mildly distended with  ingested material. No gastric wall thickening. No evidence of bowel obstruction or inflammatory change. Colonic diverticulosis is prominent involving the sigmoid colon. There is no diverticulitis. Small to moderate colonic stool burden. The appendix is not confidently visualized. Vascular/Lymphatic: Moderate aortic atherosclerosis. No aortic aneurysm. Calcified lymph nodes in the upper abdomen consistent with prior granulomatous disease, stable from prior exams. No suspicious abdominopelvic adenopathy. Reproductive: Enlarged prostate gland again seen. Other: No free air or ascites. Minimal fat in the right inguinal canal. There is a tiny fat containing umbilical hernia. Musculoskeletal: Mild scoliosis with multilevel degenerative change in the spine. There are no acute or suspicious osseous abnormalities. IMPRESSION: 1. Equivocal posterior bladder wall thickening, can be seen with urinary tract infection. Recommend correlation with urinalysis. 2. Tiny subcentimeter hyperdense lesion arising from the lower left kidney. This corresponds to a small hyperdense lesion on prior noncontrast CT and favors a benign hyperdense cyst and is unchanged. Recommend attention at follow-up oncologic imaging. 3. Colonic diverticulosis without diverticulitis. 4. Right nephrectomy without evidence of recurrent or metastatic disease in the abdomen/pelvis. 5. Enlarged prostate gland. Aortic Atherosclerosis (ICD10-I70.0). Electronically Signed   By: Keith Rake M.D.   On: 12/04/2021 18:51   CT Angio Chest PE W and/or Wo Contrast  Result Date: 12/04/2021 CLINICAL DATA:  Pulmonary embolism (PE) suspected, high prob Patient reports dizziness, unsteadiness and nausea. History of emphysema. History of renal cancer. Radiologic records indicates history of lung cancer. EXAM: CT ANGIOGRAPHY CHEST WITH CONTRAST TECHNIQUE: Multidetector CT imaging of the chest was performed using the standard protocol during bolus administration of  intravenous contrast. Multiplanar CT image reconstructions and MIPs were obtained to evaluate the vascular anatomy. RADIATION DOSE REDUCTION: This exam was performed according to the departmental dose-optimization program which includes automated exposure control, adjustment of the mA and/or kV according to patient size and/or use of iterative reconstruction technique. CONTRAST:  4mL OMNIPAQUE IOHEXOL 350 MG/ML SOLN COMPARISON:  Radiograph 06/26/2021.  Chest CT 06/05/2021 FINDINGS: Cardiovascular: There are no filling defects within the pulmonary arteries to suggest pulmonary embolus. Mild atherosclerosis of the thoracic aorta. No aneurysm or acute aortic findings. There are coronary artery calcifications. Mitral annulus calcifications. The heart is normal in size. No pericardial effusion. Mediastinum/Nodes: Distal esophageal wall thickening. There are scattered small mediastinal lymph nodes, none of which are enlarged by size criteria, and stable from prior exam. No thyroid nodule. No axillary adenopathy. Lungs/Pleura: There previous small bilateral pleural effusions have resolved. Moderate emphysema. Left perihilar interstitial thickening and architectural distortion, similar in appearance to prior exam. The previous right lower lobe pulmonary nodule is slightly smaller currently measuring 2 mm, series 6, image 108, previously 3 mm. 2-3 mm subpleural left lower lobe nodule series 6, image 92, unchanged. 3 mm left lower lobe nodule series 6, image 108, unchanged. Decreased size of left lower lobe nodule currently measuring 7 mm, series 6, image 115, previously 9 mm. The previous perihilar left upper lobe nodule is not well-defined on the current exam, although this may be due to slice selection on motion.  No evidence of new pulmonary nodule or acute airspace disease. Upper Abdomen: Assessed on concurrent abdominal CT, reported separately. Musculoskeletal: There are no acute or suspicious osseous abnormalities.  Review of the MIP images confirms the above findings. IMPRESSION: 1. No pulmonary embolus. 2. Postradiation change in the left perihilar region. Small bilateral pulmonary nodules are unchanged to decreased in size from prior exam, favoring a benign etiology. Follow-up per oncologic protocol. 3. Previous small bilateral pleural effusions have resolved. 4. Mild distal esophageal wall thickening, can be seen with reflux or esophagitis. Aortic Atherosclerosis (ICD10-I70.0) and Emphysema (ICD10-J43.9). Electronically Signed   By: Keith Rake M.D.   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  ***  Plan:  ***   *** minutes of total time was spent for this patient encounter, including preparation, face-to-face counseling with the patient and coordination of care, physical exam, and documentation of the encounter. ____________________________________  Blair Promise, PhD, MD  This document serves as a record of services personally performed by Gery Pray, MD. It was created on his behalf by Roney Mans, a trained medical scribe. The creation of this record is based on the scribe's personal observations and the provider's statements to them. This document has been checked and approved by the attending provider.

## 2021-12-18 ENCOUNTER — Ambulatory Visit
Admission: RE | Admit: 2021-12-18 | Discharge: 2021-12-18 | Disposition: A | Discharge status: Home / Self Care | Payer: Medicare Other | Source: Ambulatory Visit | Attending: Radiation Oncology | Admitting: Radiation Oncology

## 2021-12-18 VITALS — BP 150/91 | HR 101 | Temp 97.8°F | Resp 20 | Ht 72.0 in | Wt 153.8 lb

## 2021-12-18 DIAGNOSIS — Z923 Personal history of irradiation: Secondary | ICD-10-CM | POA: Diagnosis not present

## 2021-12-18 DIAGNOSIS — C3432 Malignant neoplasm of lower lobe, left bronchus or lung: Secondary | ICD-10-CM | POA: Diagnosis not present

## 2021-12-18 DIAGNOSIS — K5730 Diverticulosis of large intestine without perforation or abscess without bleeding: Secondary | ICD-10-CM | POA: Diagnosis not present

## 2021-12-18 DIAGNOSIS — Z79899 Other long term (current) drug therapy: Secondary | ICD-10-CM | POA: Insufficient documentation

## 2021-12-18 DIAGNOSIS — N400 Enlarged prostate without lower urinary tract symptoms: Secondary | ICD-10-CM | POA: Diagnosis not present

## 2021-12-18 DIAGNOSIS — K429 Umbilical hernia without obstruction or gangrene: Secondary | ICD-10-CM | POA: Insufficient documentation

## 2021-12-18 DIAGNOSIS — J432 Centrilobular emphysema: Secondary | ICD-10-CM | POA: Insufficient documentation

## 2021-12-18 DIAGNOSIS — Z85118 Personal history of other malignant neoplasm of bronchus and lung: Secondary | ICD-10-CM | POA: Diagnosis not present

## 2021-12-18 DIAGNOSIS — I700 Atherosclerosis of aorta: Secondary | ICD-10-CM | POA: Diagnosis not present

## 2021-12-18 DIAGNOSIS — Z905 Acquired absence of kidney: Secondary | ICD-10-CM | POA: Diagnosis not present

## 2021-12-18 NOTE — Progress Notes (Signed)
Dylan Harvey is here today for follow up post radiation to the lung.  Lung Side: Left  Completed radiation treatment on: 01/25/2020  Does the patient complain of any of the following: Pain:no Shortness of breath w/wo exertion: no Cough: no Hemoptysis: no Pain with swallowing: no Swallowing/choking concerns: no Appetite: good Energy Level: good Post radiation skin Changes: no    Additional comments if applicable: None noted.  BP (!) 150/91 (BP Location: Left Arm, Patient Position: Sitting, Cuff Size: Normal)   Pulse (!) 101   Temp 97.8 F (36.6 C)   Resp 20   Ht 6' (1.829 m)   Wt 153 lb 12.8 oz (69.8 kg)   SpO2 100%   BMI 20.86 kg/m

## 2021-12-27 DIAGNOSIS — N1832 Chronic kidney disease, stage 3b: Secondary | ICD-10-CM | POA: Diagnosis not present

## 2022-01-01 DIAGNOSIS — I129 Hypertensive chronic kidney disease with stage 1 through stage 4 chronic kidney disease, or unspecified chronic kidney disease: Secondary | ICD-10-CM | POA: Diagnosis not present

## 2022-01-01 DIAGNOSIS — Z85528 Personal history of other malignant neoplasm of kidney: Secondary | ICD-10-CM | POA: Diagnosis not present

## 2022-01-01 DIAGNOSIS — Z720 Tobacco use: Secondary | ICD-10-CM | POA: Diagnosis not present

## 2022-01-01 DIAGNOSIS — E785 Hyperlipidemia, unspecified: Secondary | ICD-10-CM | POA: Diagnosis not present

## 2022-01-01 DIAGNOSIS — N1832 Chronic kidney disease, stage 3b: Secondary | ICD-10-CM | POA: Diagnosis not present

## 2022-01-04 DIAGNOSIS — Z23 Encounter for immunization: Secondary | ICD-10-CM | POA: Diagnosis not present

## 2022-01-08 DIAGNOSIS — C651 Malignant neoplasm of right renal pelvis: Secondary | ICD-10-CM | POA: Diagnosis not present

## 2022-01-10 DIAGNOSIS — C651 Malignant neoplasm of right renal pelvis: Secondary | ICD-10-CM | POA: Diagnosis not present

## 2022-01-10 DIAGNOSIS — K5730 Diverticulosis of large intestine without perforation or abscess without bleeding: Secondary | ICD-10-CM | POA: Diagnosis not present

## 2022-01-15 DIAGNOSIS — C651 Malignant neoplasm of right renal pelvis: Secondary | ICD-10-CM | POA: Diagnosis not present

## 2022-01-15 DIAGNOSIS — R972 Elevated prostate specific antigen [PSA]: Secondary | ICD-10-CM | POA: Diagnosis not present

## 2022-01-22 DIAGNOSIS — B369 Superficial mycosis, unspecified: Secondary | ICD-10-CM | POA: Diagnosis not present

## 2022-02-12 DIAGNOSIS — E785 Hyperlipidemia, unspecified: Secondary | ICD-10-CM | POA: Diagnosis not present

## 2022-02-12 DIAGNOSIS — Z125 Encounter for screening for malignant neoplasm of prostate: Secondary | ICD-10-CM | POA: Diagnosis not present

## 2022-02-12 DIAGNOSIS — I10 Essential (primary) hypertension: Secondary | ICD-10-CM | POA: Diagnosis not present

## 2022-02-12 DIAGNOSIS — R7989 Other specified abnormal findings of blood chemistry: Secondary | ICD-10-CM | POA: Diagnosis not present

## 2022-02-12 DIAGNOSIS — Z1212 Encounter for screening for malignant neoplasm of rectum: Secondary | ICD-10-CM | POA: Diagnosis not present

## 2022-02-19 ENCOUNTER — Telehealth: Payer: Self-pay | Admitting: Cardiology

## 2022-02-19 DIAGNOSIS — K4090 Unilateral inguinal hernia, without obstruction or gangrene, not specified as recurrent: Secondary | ICD-10-CM | POA: Diagnosis not present

## 2022-02-19 DIAGNOSIS — R011 Cardiac murmur, unspecified: Secondary | ICD-10-CM | POA: Diagnosis not present

## 2022-02-19 DIAGNOSIS — Z0000 Encounter for general adult medical examination without abnormal findings: Secondary | ICD-10-CM | POA: Diagnosis not present

## 2022-02-19 DIAGNOSIS — J449 Chronic obstructive pulmonary disease, unspecified: Secondary | ICD-10-CM | POA: Diagnosis not present

## 2022-02-19 DIAGNOSIS — K5730 Diverticulosis of large intestine without perforation or abscess without bleeding: Secondary | ICD-10-CM | POA: Diagnosis not present

## 2022-02-19 DIAGNOSIS — R809 Proteinuria, unspecified: Secondary | ICD-10-CM | POA: Diagnosis not present

## 2022-02-19 DIAGNOSIS — F411 Generalized anxiety disorder: Secondary | ICD-10-CM | POA: Diagnosis not present

## 2022-02-19 DIAGNOSIS — I10 Essential (primary) hypertension: Secondary | ICD-10-CM | POA: Diagnosis not present

## 2022-02-19 DIAGNOSIS — I2510 Atherosclerotic heart disease of native coronary artery without angina pectoris: Secondary | ICD-10-CM | POA: Diagnosis not present

## 2022-02-19 DIAGNOSIS — D126 Benign neoplasm of colon, unspecified: Secondary | ICD-10-CM | POA: Diagnosis not present

## 2022-02-19 DIAGNOSIS — F17200 Nicotine dependence, unspecified, uncomplicated: Secondary | ICD-10-CM | POA: Diagnosis not present

## 2022-02-19 DIAGNOSIS — Z79899 Other long term (current) drug therapy: Secondary | ICD-10-CM | POA: Diagnosis not present

## 2022-02-19 DIAGNOSIS — C3490 Malignant neoplasm of unspecified part of unspecified bronchus or lung: Secondary | ICD-10-CM | POA: Diagnosis not present

## 2022-02-19 DIAGNOSIS — R5383 Other fatigue: Secondary | ICD-10-CM | POA: Diagnosis not present

## 2022-02-19 DIAGNOSIS — E785 Hyperlipidemia, unspecified: Secondary | ICD-10-CM | POA: Diagnosis not present

## 2022-02-19 DIAGNOSIS — R82998 Other abnormal findings in urine: Secondary | ICD-10-CM | POA: Diagnosis not present

## 2022-02-19 NOTE — Telephone Encounter (Signed)
Will route this information to Dr. Johney Frame as an FYI to make her aware of this plan.  Pts PCP called in and made the pt an appt to see Dr. Johney Frame for 12/6.   Pt last seen back in May 2022.  Pt will be seeing Dr. Johney Frame for worsening DOE and bradycardia with HR in PCP office of 56 bpm, but EKG done in their office noted HR- 70 bpm.  Pt also diagnosed with new murmur when seeing PCP today.  PCP Dr. Joylene Draft will be sending his progress notes with the pt from today to Dr. Johney Frame for review and to have when seeing the pt on 12/6.

## 2022-02-19 NOTE — Telephone Encounter (Signed)
Pt c/o Shortness Of Breath: STAT if SOB developed within the last 24 hours or pt is noticeably SOB on the phone  1. Are you currently SOB (can you hear that pt is SOB on the phone)?  Not currently with the patient  2. How long have you been experiencing SOB? Unsure how long, per Tiffany, Dr. Joylene Draft made note of worsening DOE  3. Are you SOB when sitting or when up moving around?  Exertional only   4. Are you currently experiencing any other symptoms?   More bradycardia recently, but not currently with the patient. Per Tiffany, HR was at 53 today. EKG read 70 HR. Tiffany plans to fax Dr. Silvestre Mesi notes to the office. Tiffany also states patient has a new left sternal border murmur. Tiffany scheduled the patient for 12/06 at 3:00 PM with Dr. Johney Frame.

## 2022-02-20 NOTE — Progress Notes (Unsigned)
Cardiology Office Note:    Date:  02/21/2022   ID:  Dylan Harvey, DOB 04-08-1950, MRN [id number]  PCP:  Crist Infante, MD   [facility] Providers Cardiologist:  None {  Referring MD: Crist Infante, MD    History of Present Illness:    Dylan Harvey is a 71 y.o. male with a hx of coronary calcification on CT chest, HTN, HLD, history of lung cancer s/p XRT followed by Dr. Sondra Come, COPD and tobacco use presents to clinic for follow-up.  Was last seen in clinic on 07/2020 for pre-operative evaluation. Was doing very well from a CV standpoint and no further testing was needed at that time.  Today, the patient overall feels well today. No chest pain, SOB, lightheadedness, dizziness or fatigue. Remains active and feels well with activity. Continues to smoke about 1ppd and is trying to quit. He does not wish to start a statin but is willing to take zetia 10mg  daily.   Past Medical History:  Diagnosis Date   Allergy    Arthritis    thumbs   Colonic adenoma 2009   ED (erectile dysfunction)    Emphysema lung ([place]) MILD    Gross hematuria    Hematospermia    History of colon polyps    History of radiation therapy 01/18/20-01/25/2020   Left Lung 3 fx; Dr. Gery Pray   History of radiation therapy 07/24/2018-07/31/2018   SBRT Left lung; Dr Gery Pray   Hyperlipemia    Hypertension    Left testicular pain    Lumbago    Neoplasm of kidney    Renal cancer [facility])    Renal cyst    Right testicular pain    Tobacco abuse     Past Surgical History:  Procedure Laterality Date   COLONOSCOPY     FUDUCIAL PLACEMENT N/A 06/11/2018   Procedure: PLACEMENT OF FUDUCIAL;  Surgeon: Melrose Nakayama, MD;  Location: [facility];  Service: Thoracic;  Laterality: N/A;   HERNIA REPAIR     inguinal   INNER EAR SURGERY     POLYPECTOMY     ROBOT ASSITED LAPAROSCOPIC NEPHROURETERECTOMY Right 09/07/2020   Procedure: XI ROBOT ASSITED LAPAROSCOPIC NEPHROURETERECTOMY/ CYSTOSCOPY/ STENT PLACEMENT/  TRANSURETHRAL RESECTION OF URETERAL ORIFICE;  Surgeon: Ceasar Mons, MD;  Location: [facility];  Service: Urology;  Laterality: Right;   VIDEO BRONCHOSCOPY WITH ENDOBRONCHIAL NAVIGATION N/A 06/11/2018   Procedure: VIDEO BRONCHOSCOPY WITH ENDOBRONCHIAL NAVIGATION;  Surgeon: Melrose Nakayama, MD;  Location: [facility];  Service: Thoracic;  Laterality: N/A;   WISDOM TOOTH EXTRACTION      Current Medications: Current Meds  Medication Sig   ALFALFA PO Take by mouth daily.   amLODipine (NORVASC) 5 MG tablet Take 5 mg by mouth daily.   Ascorbic Acid (VITAMIN C) 1000 MG tablet Take 1,000 mg by mouth daily.   b complex vitamins tablet Take 1 tablet by mouth daily.   calcium carbonate (TUMS - DOSED IN MG ELEMENTAL CALCIUM) 500 MG chewable tablet Chew 1 tablet by mouth as needed for indigestion or heartburn.   cholecalciferol (VITAMIN D) 1000 units tablet Take 1,000 Units by mouth daily.   fexofenadine (ALLEGRA) 60 MG tablet Take 60 mg by mouth daily.   Flaxseed, Linseed, (FLAX SEED OIL PO) Take by mouth daily.   Omega-3 Fatty Acids (FISH OIL) 1000 MG CAPS Take by mouth daily.   PRESCRIPTION MEDICATION Place 1 capsule into the right ear every other day. Sulfacetamide amphoteracin b - chloramphenincol - hydrocortisone  PSYLLIUM FIBER PO Take by mouth daily.   sodium chloride (OCEAN) 0.65 % SOLN nasal spray Place 1 spray into the nose daily.     Allergies:   Neomycin-bacitracin zn-polymyx   Social History   Socioeconomic History   Marital status: Married    Spouse name: Not on file   Number of children: Not on file   Years of education: Not on file   Highest education level: Not on file  Occupational History   Not on file  Tobacco Use   Smoking status: Every Day    Packs/day: 1.00    Types: Cigarettes   Smokeless tobacco: Never  Substance and Sexual Activity   Alcohol use: Yes    Alcohol/week: 2.0 standard drinks of alcohol    Types: 2 Shots of liquor per week    Comment:  occasionally   Drug use: No   Sexual activity: Not on file  Other Topics Concern   Not on file  Social History Narrative   Married 1980 no chilldren. Went to college   Occupation is Preservationist @ [employer]   Social Determinants of Radio broadcast assistant Strain: Not on file  Food Insecurity: Not on file  Transportation Needs: Not on file  Physical Activity: Not on file  Stress: Not on file  Social Connections: Not on file     Family History: The patient's family history includes Alzheimer's disease in his mother; Hypothyroidism in his mother; Lung cancer in his father; Prostate cancer in his father; Severe combined immunodeficiency in his mother. There is no history of Colon cancer, Colon polyps, Esophageal cancer, Rectal cancer, or Stomach cancer.  ROS:   Please see the history of present illness.    Review of Systems  Constitutional:  Negative for diaphoresis, malaise/fatigue and weight loss.  HENT:  Negative for ear pain, nosebleeds and sore throat.   Eyes:  Negative for double vision and redness.  Respiratory:  Negative for shortness of breath and wheezing.   Cardiovascular:  Negative for chest pain, palpitations, orthopnea, claudication, leg swelling and PND.  Gastrointestinal:  Negative for blood in stool, diarrhea and heartburn.  Genitourinary:  Negative for dysuria and hematuria.  Musculoskeletal:  Negative for falls, joint pain and myalgias.  Neurological:  Negative for tremors, seizures and loss of consciousness.  Endo/Heme/Allergies:  Negative for environmental allergies.  Psychiatric/Behavioral:  Negative for depression and suicidal ideas. The patient is not nervous/anxious.      EKGs/Labs/Other Studies Reviewed:    The following studies were reviewed today:  CT Chest 05/30/20: FINDINGS: Cardiovascular: Aortic atherosclerosis. Tortuous thoracic aorta. Normal heart size with minimal anterior pericardial  thickening. Multivessel coronary artery atherosclerosis. Aortic valve calcification.   Mediastinum/Nodes: No mediastinal or definite hilar adenopathy, given limitations of unenhanced CT.   Lungs/Pleura: No pleural fluid. Moderate centrilobular emphysema. Right greater than left base scarring.   Progressive surrounding ground-glass interstitial thickening about the posterior left upper lobe and adjacent superior segment left lower lobe. The posterior left upper lobe irregular nodule is less well-defined today. Example 1.2 x 1.1 cm on 88/7 versus 1.6 x 1.4 cm on the prior diagnostic CT.   7 mm left lower lobe pulmonary nodule on 133/7 is similar to 8 mm on the prior.   4-5 mm more inferior posterior left lower lobe pulmonary nodule on 140/7 is unchanged.   2-3 mm left lower lobe pulmonary nodule on 124/7, similar.   The previously treated superior segment left lower lobe pulmonary nodule is no  longer readily apparent.   Upper Abdomen: Normal imaged portions of the liver, spleen, stomach, pancreas, gallbladder, adrenal glands, kidneys.   Musculoskeletal: No acute osseous abnormality.   IMPRESSION: 1. Progressive radiation change about the posterior left upper lobe and superior segment left lower lobe. 2. Decreased size of posterior left upper lobe pulmonary nodule. The superior segment left lower lobe treated nodule is no longer readily apparent. 3. No thoracic adenopathy, given limitations of noncontrast exam. 4. Other tiny pulmonary nodules are similar and likely benign. 5. Aortic atherosclerosis (ICD10-I70.0), coronary artery atherosclerosis and emphysema (ICD10-J43.9). 6. Aortic valvular calcifications. Consider echocardiography to evaluate for valvular dysfunction.    EKG:   02/21/22: SB, RBBB, LPFB, HR 43  Recent Labs: 12/04/2021: BUN 21; Creatinine, Ser 1.34; Hemoglobin 14.0; Platelets 181; Potassium 4.8; Sodium 133  Recent Lipid Panel No results found for:  "CHOL", "TRIG", "HDL", "CHOLHDL", "VLDL", "LDLCALC", "LDLDIRECT"   Risk Assessment/Calculations:       Physical Exam:    VS:  BP 130/68   Pulse (!) 43   Ht 6' (1.829 m)   Wt 156 lb 3.2 oz (70.9 kg)   SpO2 97%   BMI 21.18 kg/m     Wt Readings from Last 3 Encounters:  02/21/22 156 lb 3.2 oz (70.9 kg)  12/18/21 153 lb 12.8 oz (69.8 kg)  12/04/21 158 lb (71.7 kg)     GEN: Well nourished, well developed in no acute distress HEENT: Normal NECK: No JVD; No carotid bruits LYMPHATICS: No lymphadenopathy CARDIAC: RRR, no murmurs, rubs, gallops RESPIRATORY:  Clear to auscultation without rales, wheezing or rhonchi  ABDOMEN: Soft, non-tender, non-distended MUSCULOSKELETAL:  No edema; No deformity  SKIN: Warm and dry NEUROLOGIC:  Alert and oriented x 3 PSYCHIATRIC:  Normal affect   ASSESSMENT:    1. Coronary artery disease involving native coronary artery of native heart without angina pectoris   2. Right bundle branch block   3. Primary hypertension   4. Tobacco abuse   5. Medication management     PLAN:    In order of problems listed above:  #Multivessel Coronary Calcification on CT chest: Incidentally noted on CT chest. Patient is asymptomatic. Declined statin therapy at last visit. Today, he feels well and is very active without anginal symptoms. -Willing to start zetia 10mg  daily but declined statin therapy -Check lipids in 8 weeks to ensure LDL at goal -Continue healthy diet and exercise  #HTN: Well controlled and at goal <120s/80s. -Continue amlodipine 5mg  daily -Lisinopril stopped due to rising Cr -Follow-up with PCP as scheduled  #RBBB: #LPFB: Asymptomatic. Will monitor. Not on nodal agents.  #Tobacco Abuse: -Encouraged cessation  #RCC s/p Nephrectomy: -Doing well post-op -Cr improved to 1.34    Follow-up as needed.  Medication Adjustments/Labs and Tests Ordered: Current medicines are reviewed at length with the patient today.  Concerns  regarding medicines are outlined above.  Orders Placed This Encounter  Procedures   Lipid Profile   EKG 12-Lead   No orders of the defined types were placed in this encounter.   Patient Instructions  Medication Instructions:   Your physician recommends that you continue on your current medications as directed. Please refer to the Current Medication list given to you today.  *If you need a refill on your cardiac medications before your next appointment, please call your pharmacy*   Lab Work:  IN [place] OFFICE--CHECK LIPIDS--PLEASE COME FASTING TO THIS LAB APPOINTMENT  If you have labs (blood work) drawn today and  your tests are completely normal, you will receive your results only by: MyChart Message (if you have MyChart) OR A paper copy in the mail If you have any lab test that is abnormal or we need to change your treatment, we will call you to review the results.    Follow-Up: At [facility], you and your health needs are our priority.  As part of our continuing mission to provide you with exceptional heart care, we have created designated Provider Care Teams.  These Care Teams include your primary Cardiologist (physician) and Advanced Practice Providers (APPs -  Physician Assistants and Nurse Practitioners) who all work together to provide you with the care you need, when you need it.  We recommend signing up for the patient portal called "MyChart".  Sign up information is provided on this After Visit Summary.  MyChart is used to connect with patients for Virtual Visits (Telemedicine).  Patients are able to view lab/test results, encounter notes, upcoming appointments, etc.  Non-urgent messages can be sent to your provider as well.   To learn more about what you can do with MyChart, go to [email or web address].    Your next appointment:   1 year(s)  The format for your next appointment:   In Person  Provider:   DR. Johney Frame   Important  Information About Sugar           Signed, Freada Bergeron, MD  02/21/2022 3:48 PM    [place]

## 2022-02-21 ENCOUNTER — Ambulatory Visit: Payer: Medicare Other | Attending: Cardiology | Admitting: Cardiology

## 2022-02-21 ENCOUNTER — Encounter: Payer: Self-pay | Admitting: Cardiology

## 2022-02-21 VITALS — BP 130/68 | HR 43 | Ht 72.0 in | Wt 156.2 lb

## 2022-02-21 DIAGNOSIS — Z79899 Other long term (current) drug therapy: Secondary | ICD-10-CM | POA: Insufficient documentation

## 2022-02-21 DIAGNOSIS — I2510 Atherosclerotic heart disease of native coronary artery without angina pectoris: Secondary | ICD-10-CM | POA: Insufficient documentation

## 2022-02-21 DIAGNOSIS — I4510 Unspecified right bundle-branch block: Secondary | ICD-10-CM | POA: Diagnosis not present

## 2022-02-21 DIAGNOSIS — I10 Essential (primary) hypertension: Secondary | ICD-10-CM | POA: Insufficient documentation

## 2022-02-21 DIAGNOSIS — Z720 Tobacco use: Secondary | ICD-10-CM | POA: Insufficient documentation

## 2022-02-21 NOTE — Telephone Encounter (Signed)
Pt saw Dr. Johney Frame in clinic today for complaints.  See OV note from today for further details.

## 2022-02-21 NOTE — Patient Instructions (Signed)
Medication Instructions:   Your physician recommends that you continue on your current medications as directed. Please refer to the Current Medication list given to you today.  *If you need a refill on your cardiac medications before your next appointment, please call your pharmacy*   Lab Work:  IN [place] OFFICE--CHECK LIPIDS--PLEASE COME FASTING TO THIS LAB APPOINTMENT  If you have labs (blood work) drawn today and your tests are completely normal, you will receive your results only by: [place] (if you have MyChart) OR A paper copy in the mail If you have any lab test that is abnormal or we need to change your treatment, we will call you to review the results.    Follow-Up: At [facility], you and your health needs are our priority.  As part of our continuing mission to provide you with exceptional heart care, we have created designated Provider Care Teams.  These Care Teams include your primary Cardiologist (physician) and Advanced Practice Providers (APPs -  Physician Assistants and Nurse Practitioners) who all work together to provide you with the care you need, when you need it.  We recommend signing up for the patient portal called "MyChart".  Sign up information is provided on this After Visit Summary.  MyChart is used to connect with patients for Virtual Visits (Telemedicine).  Patients are able to view lab/test results, encounter notes, upcoming appointments, etc.  Non-urgent messages can be sent to your provider as well.   To learn more about what you can do with MyChart, go to [email or web address].    Your next appointment:   1 year(s)  The format for your next appointment:   In Person  Provider:   DR. Johney Frame   Important Information About Sugar

## 2022-02-26 DIAGNOSIS — R972 Elevated prostate specific antigen [PSA]: Secondary | ICD-10-CM | POA: Diagnosis not present

## 2022-03-01 DIAGNOSIS — H60391 Other infective otitis externa, right ear: Secondary | ICD-10-CM | POA: Diagnosis not present

## 2022-03-01 DIAGNOSIS — Z9089 Acquired absence of other organs: Secondary | ICD-10-CM | POA: Diagnosis not present

## 2022-04-16 DIAGNOSIS — H60391 Other infective otitis externa, right ear: Secondary | ICD-10-CM | POA: Diagnosis not present

## 2022-04-18 ENCOUNTER — Ambulatory Visit: Payer: Medicare Other | Attending: Cardiology

## 2022-04-18 DIAGNOSIS — I4510 Unspecified right bundle-branch block: Secondary | ICD-10-CM | POA: Diagnosis not present

## 2022-04-18 DIAGNOSIS — Z79899 Other long term (current) drug therapy: Secondary | ICD-10-CM | POA: Diagnosis not present

## 2022-04-18 DIAGNOSIS — I10 Essential (primary) hypertension: Secondary | ICD-10-CM | POA: Diagnosis not present

## 2022-04-18 DIAGNOSIS — I2510 Atherosclerotic heart disease of native coronary artery without angina pectoris: Secondary | ICD-10-CM

## 2022-04-18 DIAGNOSIS — Z720 Tobacco use: Secondary | ICD-10-CM

## 2022-04-19 LAB — LIPID PANEL
Chol/HDL Ratio: 2 ratio (ref 0.0–5.0)
Cholesterol, Total: 146 mg/dL (ref 100–199)
HDL: 72 mg/dL (ref >39)
LDL Chol Calc (NIH): 64 mg/dL (ref 0–99)
Triglycerides: 45 mg/dL (ref 0–149)
VLDL Cholesterol Cal: 10 mg/dL (ref 5–40)

## 2022-05-28 DIAGNOSIS — Z9089 Acquired absence of other organs: Secondary | ICD-10-CM | POA: Diagnosis not present

## 2022-05-28 DIAGNOSIS — H903 Sensorineural hearing loss, bilateral: Secondary | ICD-10-CM | POA: Diagnosis not present

## 2022-05-28 DIAGNOSIS — H60391 Other infective otitis externa, right ear: Secondary | ICD-10-CM | POA: Diagnosis not present

## 2022-06-06 ENCOUNTER — Ambulatory Visit (HOSPITAL_COMMUNITY)
Admission: RE | Admit: 2022-06-06 | Discharge: 2022-06-06 | Disposition: A | Discharge status: Home / Self Care | Payer: Medicare Other | Source: Ambulatory Visit | Attending: Radiation Oncology | Admitting: Radiation Oncology

## 2022-06-06 DIAGNOSIS — R918 Other nonspecific abnormal finding of lung field: Secondary | ICD-10-CM | POA: Diagnosis not present

## 2022-06-06 DIAGNOSIS — C3490 Malignant neoplasm of unspecified part of unspecified bronchus or lung: Secondary | ICD-10-CM | POA: Diagnosis not present

## 2022-06-06 DIAGNOSIS — J439 Emphysema, unspecified: Secondary | ICD-10-CM | POA: Diagnosis not present

## 2022-06-06 DIAGNOSIS — C3432 Malignant neoplasm of lower lobe, left bronchus or lung: Secondary | ICD-10-CM | POA: Diagnosis not present

## 2022-06-18 NOTE — Progress Notes (Signed)
Dylan Harvey is here today for follow up post radiation to the lung.  Lung Side:  left lung completed treatment 01/25/20  Does the patient complain of any of the following: Pain:None Shortness of breath w/wo exertion:  Denies Cough:  Denies Hemoptysis: Denies Pain with swallowing: Denies Swallowing/choking concerns: denies Appetite: good Energy Level: good Post radiation skin Changes: no issues    Additional comments if applicable: Vitals:   06/25/22 1444  BP: (!) 148/97  Pulse: 100  Resp: 18  SpO2: 100%  Weight: 69.6 kg

## 2022-06-24 NOTE — Progress Notes (Signed)
Radiation Oncology         ([phone] ________________________________  Name: Dylan Harvey MRN: [id number]  Date: 06/25/2022  DOB: 1950-11-26  Follow-Up Visit Note  CC: Rodrigo Ran, MD  Rodrigo Ran, MD  No diagnosis found.  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 He was last seen here for follow-up on 12/18/21. His most recent chest CT without contrast on 06/06/22 showed posttreatment changes in the left lung consisting of a very slight increase in thickness of bandlike changes (overall similar in appearance), possibly representing maturation of posttreatment changes. CT also showed stable tiny left sided lung nodules, and underlying emphysematous lung changes. CT otherwise showed no new findings or evidence of metastatic disease.       No significant oncologic interval history since the patient was last seen for follow-up. Although, the patient did present to his PCP in December with progressive SOB and bradycardia. He was diagnosed with a new heart murmur at that time which is now being followed by cardiology.         ***                  Allergies:  is allergic to neomycin-bacitracin zn-polymyx.  Meds: Current Outpatient Medications  Medication Sig Dispense Refill   ALFALFA PO Take by mouth daily.     amLODipine (NORVASC) 5 MG tablet Take 5 mg by mouth daily.     Ascorbic Acid (VITAMIN C) 1000 MG tablet Take 1,000 mg by mouth daily.     b complex vitamins tablet Take 1 tablet by mouth daily.     calcium carbonate (TUMS - DOSED IN MG ELEMENTAL CALCIUM) 500 MG chewable  tablet Chew 1 tablet by mouth as needed for indigestion or heartburn.     cholecalciferol (VITAMIN D) 1000 units tablet Take 1,000 Units by mouth daily.     fexofenadine (ALLEGRA) 60 MG tablet Take 60 mg by mouth daily.     Flaxseed, Linseed, (FLAX SEED OIL PO) Take by mouth daily.     Omega-3 Fatty Acids (FISH OIL) 1000 MG CAPS Take by mouth daily.     PRESCRIPTION MEDICATION Place 1 capsule into the right ear every other day. Sulfacetamide amphoteracin b - chloramphenincol - hydrocortisone     PSYLLIUM FIBER PO Take by mouth daily.     sodium chloride (OCEAN) 0.65 % SOLN nasal spray Place 1 spray into the nose daily.     No current facility-administered medications for this encounter.    Physical Findings: The patient is in no acute distress. Patient is alert and oriented.  vitals were not taken for this visit. .  No significant changes. Lungs are clear to auscultation bilaterally. Heart has regular rate and rhythm. No palpable cervical, supraclavicular, or axillary adenopathy. Abdomen soft, non-tender, normal bowel sounds.   Lab Findings: Lab Results  Component Value Date   WBC 7.4 12/04/2021   HGB 14.0 12/04/2021   HCT 39.9 12/04/2021   MCV 92.6 12/04/2021   PLT 181 12/04/2021    Radiographic Findings: CT CHEST WO CONTRAST  Result Date:  06/08/2022 CLINICAL DATA:  Staging non-small-cell lung cancer. Primary cancer left lower lobe of the lung. * Tracking Code: BO * EXAM: CT CHEST WITHOUT CONTRAST TECHNIQUE: Multidetector CT imaging of the chest was performed following the standard protocol without IV contrast. RADIATION DOSE REDUCTION: This exam was performed according to the departmental dose-optimization program which includes automated exposure control, adjustment of the mA and/or kV according to patient size and/or use of iterative reconstruction technique. COMPARISON:  CT 12/11/2021 and older FINDINGS: Cardiovascular: Trace pericardial fluid. The heart is nonenlarged. Coronary  artery calcifications are seen. There are some calcifications along the mitral valve annulus. The thoracic aorta on this non IV contrast exam has a normal course and caliber with scattered vascular calcifications. Mediastinum/Nodes: Grossly normal caliber thoracic esophagus. Small thyroid gland. No specific abnormal lymph node enlargement seen in the axillary regions. Small right hilar node identified on image 77 of series 2 measuring 14 x 9 mm today and previously would have measured 14 by 10 mm. No definite abnormal mediastinal nodal enlargement. Lungs/Pleura: Centrilobular emphysematous lung changes are identified. No pleural effusion or pneumothorax. Slight apical pleural thickening. There is some basilar atelectasis or scar of the right lung base medially. No right-sided lung mass. Once again there is a fiduciary marker noted in the superior segment of the left lower lobe near the hilum. Associated bandlike opacity in this location is again seen. In the coronal plane the thickness of the bandlike areas slightly increased from previous as best seen on coronal series 6 image 95. Slightly more confluent. This could be evolution of treatment change. There is some tethering of the interlobar fissure. Recommend attention on follow-up. There is also some subtle bandlike areas in the adjacent left upper lobe. Some distortion of the adjacent bronchi and narrowing of the bronchus of the left lower lobe as best seen on series 5 image 84. Focal bronchial occlusion in this location as well as stable. In addition there is a 3 mm subpleural nodule in the left lower lobe on the previous examination which is stable today on series 5, image 95. The small parenchymal nodule more caudal measuring 3 mm is stable today on series 5, image 113 as well. Few other punctate nodules identified such as image 75 of series 5 in the superior segment left lower lobe laterally. Upper Abdomen: Moderate debris in the stomach. Adrenal glands are  slightly thickened, particularly on the left but unchanged. Calcified nodes identified in the upper abdomen. Previous right nephrectomy. Musculoskeletal: Diffuse degenerative changes along the spine. IMPRESSION: Posttreatment changes in the left lung with a fiduciary marker centered in the superior segment of the left lower lobe. The confluence of thickness of the bandlike change is slightly increased compared to previous but otherwise similar. This could represent maturation of posttreatment changes. Recommend continued follow-up surveillance. Stable tiny left-sided lung nodules. These also could be assessed on follow-up for the patient's neoplasm. Underlying emphysematous lung changes. No developing new lymph node enlargement. Aortic Atherosclerosis (ICD10-I70.0) and Emphysema (ICD10-J43.9). Electronically Signed   By: Karen Kays M.D.   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  ***  Plan:  ***   *** minutes of total time was spent for this patient encounter, including preparation, face-to-face counseling with the patient and coordination of care, physical exam, and documentation of the encounter. ____________________________________  [place]a Skye.  Sondra Come, PhD, MD  This document serves as a record of services personally performed by Gery Pray, MD. It was created on his behalf by Roney Mans, a trained medical scribe. The creation of this record is based on the scribe's personal observations and the provider's statements to them. This document has been checked and approved by the attending provider.

## 2022-06-25 ENCOUNTER — Encounter: Payer: Self-pay | Admitting: Radiation Oncology

## 2022-06-25 ENCOUNTER — Other Ambulatory Visit: Payer: Self-pay

## 2022-06-25 ENCOUNTER — Ambulatory Visit
Admission: RE | Admit: 2022-06-25 | Discharge: 2022-06-25 | Disposition: A | Discharge status: Home / Self Care | Payer: Medicare Other | Source: Ambulatory Visit | Attending: Radiation Oncology | Admitting: Radiation Oncology

## 2022-06-25 VITALS — BP 148/97 | HR 100 | Resp 18 | Wt 153.4 lb

## 2022-06-25 DIAGNOSIS — Z85118 Personal history of other malignant neoplasm of bronchus and lung: Secondary | ICD-10-CM | POA: Insufficient documentation

## 2022-06-25 DIAGNOSIS — Z923 Personal history of irradiation: Secondary | ICD-10-CM | POA: Diagnosis not present

## 2022-06-25 DIAGNOSIS — J439 Emphysema, unspecified: Secondary | ICD-10-CM | POA: Diagnosis not present

## 2022-06-25 DIAGNOSIS — Z79899 Other long term (current) drug therapy: Secondary | ICD-10-CM | POA: Insufficient documentation

## 2022-06-25 DIAGNOSIS — I3481 Nonrheumatic mitral (valve) annulus calcification: Secondary | ICD-10-CM | POA: Insufficient documentation

## 2022-06-25 DIAGNOSIS — C3432 Malignant neoplasm of lower lobe, left bronchus or lung: Secondary | ICD-10-CM

## 2022-07-02 DIAGNOSIS — C651 Malignant neoplasm of right renal pelvis: Secondary | ICD-10-CM | POA: Diagnosis not present

## 2022-07-09 DIAGNOSIS — Z9089 Acquired absence of other organs: Secondary | ICD-10-CM | POA: Diagnosis not present

## 2022-07-09 DIAGNOSIS — H60391 Other infective otitis externa, right ear: Secondary | ICD-10-CM | POA: Diagnosis not present

## 2022-07-09 DIAGNOSIS — H903 Sensorineural hearing loss, bilateral: Secondary | ICD-10-CM | POA: Diagnosis not present

## 2022-07-18 DIAGNOSIS — C651 Malignant neoplasm of right renal pelvis: Secondary | ICD-10-CM | POA: Diagnosis not present

## 2022-07-18 DIAGNOSIS — N281 Cyst of kidney, acquired: Secondary | ICD-10-CM | POA: Diagnosis not present

## 2022-07-23 DIAGNOSIS — C651 Malignant neoplasm of right renal pelvis: Secondary | ICD-10-CM | POA: Diagnosis not present

## 2022-07-26 ENCOUNTER — Telehealth: Payer: Self-pay | Admitting: *Deleted

## 2022-07-26 NOTE — Telephone Encounter (Signed)
CALLED PATIENT TO INFORM OF CT FOR  12-24-22- ARRIVAL TIME- 1:15 PM @ [facility], NO RESTRICTIONS TO TEST, PATIENT TO RECEIVE RESULTS FROM DR. KINARD ON 12-31-22 @ 3 PM, SPOKE WITH PATIENT AND HE IS AWARE OF THESE APPTS. AND THE INSTRUCTIONS

## 2022-08-20 DIAGNOSIS — H6091 Unspecified otitis externa, right ear: Secondary | ICD-10-CM | POA: Diagnosis not present

## 2022-08-27 ENCOUNTER — Encounter: Payer: Self-pay | Admitting: Internal Medicine

## 2022-09-19 ENCOUNTER — Ambulatory Visit (AMBULATORY_SURGERY_CENTER): Payer: Medicare Other

## 2022-09-19 ENCOUNTER — Other Ambulatory Visit: Payer: Self-pay

## 2022-09-19 VITALS — Ht 72.0 in | Wt 155.0 lb

## 2022-09-19 DIAGNOSIS — Z86010 Personal history of colonic polyps: Secondary | ICD-10-CM

## 2022-09-19 MED ORDER — NA SULFATE-K SULFATE-MG SULF 17.5-3.13-1.6 GM/177ML PO SOLN
1.0000 | Freq: Once | ORAL | 0 refills | Status: AC
Start: 2022-09-19 — End: 2022-09-19

## 2022-09-19 NOTE — Progress Notes (Signed)
Denies allergies to eggs or soy products. Denies complication of anesthesia or sedation. Denies use of weight loss medication. Denies use of O2.   Emmi instructions given for colonoscopy.  

## 2022-10-03 ENCOUNTER — Ambulatory Visit (AMBULATORY_SURGERY_CENTER): Payer: Medicare Other | Admitting: Internal Medicine

## 2022-10-03 ENCOUNTER — Encounter: Payer: Self-pay | Admitting: Internal Medicine

## 2022-10-03 VITALS — BP 148/77 | HR 72 | Temp 98.0°F | Resp 16 | Ht 72.0 in | Wt 155.0 lb

## 2022-10-03 DIAGNOSIS — D128 Benign neoplasm of rectum: Secondary | ICD-10-CM

## 2022-10-03 DIAGNOSIS — E785 Hyperlipidemia, unspecified: Secondary | ICD-10-CM | POA: Diagnosis not present

## 2022-10-03 DIAGNOSIS — Z86010 Personal history of colonic polyps: Secondary | ICD-10-CM

## 2022-10-03 DIAGNOSIS — Z09 Encounter for follow-up examination after completed treatment for conditions other than malignant neoplasm: Secondary | ICD-10-CM

## 2022-10-03 DIAGNOSIS — J439 Emphysema, unspecified: Secondary | ICD-10-CM | POA: Diagnosis not present

## 2022-10-03 DIAGNOSIS — I10 Essential (primary) hypertension: Secondary | ICD-10-CM | POA: Diagnosis not present

## 2022-10-03 MED ORDER — SODIUM CHLORIDE 0.9 % IV SOLN
500.0000 mL | INTRAVENOUS | Status: DC
Start: 2022-10-03 — End: 2022-10-03

## 2022-10-03 NOTE — Patient Instructions (Signed)
 -  Handout on polyps, diverticulosis and hemorrhoids  provided -await pathology results -repeat colonoscopy in 5 years for surveillance recommended.  -Continue present medications   YOU HAD AN ENDOSCOPIC PROCEDURE TODAY AT [place]:   Refer to the procedure report that was given to you for any specific questions about what was found during the examination.  If the procedure report does not answer your questions, please call your gastroenterologist to clarify.  If you requested that your care partner not be given the details of your procedure findings, then the procedure report has been included in a sealed envelope for you to review at your convenience later.  YOU SHOULD EXPECT: Some feelings of bloating in the abdomen. Passage of more gas than usual.  Walking can help get rid of the air that was put into your GI tract during the procedure and reduce the bloating. If you had a lower endoscopy (such as a colonoscopy or flexible sigmoidoscopy) you may notice spotting of blood in your stool or on the toilet paper. If you underwent a bowel prep for your procedure, you may not have a normal bowel movement for a few days.  Please Note:  You might notice some irritation and congestion in your nose or some drainage.  This is from the oxygen used during your procedure.  There is no need for concern and it should clear up in a day or so.  SYMPTOMS TO REPORT IMMEDIATELY:  Following lower endoscopy (colonoscopy or flexible sigmoidoscopy):  Excessive amounts of blood in the stool  Significant tenderness or worsening of abdominal pains  Swelling of the abdomen that is new, acute  Fever of 100F or higher  For urgent or emergent issues, a gastroenterologist can be reached at any hour by calling [phone]. Do not use MyChart messaging for urgent concerns.    DIET:  We do recommend a small meal at first, but then you may proceed to your regular diet.  Drink plenty of fluids but you  should avoid alcoholic beverages for 24 hours.  ACTIVITY:  You should plan to take it easy for the rest of today and you should NOT DRIVE or use heavy machinery until tomorrow (because of the sedation medicines used during the test).    FOLLOW UP: Our staff will call the number listed on your records the next business day following your procedure.  We will call around 7:15- 8:00 am to check on you and address any questions or concerns that you may have regarding the information given to you following your procedure. If we do not reach you, we will leave a message.     If any biopsies were taken you will be contacted by phone or by letter within the next 1-3 weeks.  Please call us at [phone] if you have not heard about the biopsies in 3 weeks.    SIGNATURES/CONFIDENTIALITY: You and/or your care partner have signed paperwork which will be entered into your electronic medical record.  These signatures attest to the fact that that the information above on your After Visit Summary has been reviewed and is understood.  Full responsibility of the confidentiality of this discharge information lies with you and/or your care-partner.

## 2022-10-03 NOTE — Progress Notes (Signed)
HISTORY OF PRESENT ILLNESS:  Dylan Harvey is a 72 y.o. male with a history of multiple advanced adenomatous colon polyps.  Now for surveillance colonoscopy  REVIEW OF SYSTEMS:  All non-GI ROS negative except for  Past Medical History:  Diagnosis Date   Allergy    Arthritis    thumbs   Cataract    Colonic adenoma 2009   ED (erectile dysfunction)    Emphysema lung (HCC) MILD    Gross hematuria    Hematospermia    History of colon polyps    History of radiation therapy 01/18/20-01/25/2020   Left Lung 3 fx; Dr. Antony Blackbird   History of radiation therapy 07/24/2018-07/31/2018   SBRT Left lung; Dr Antony Blackbird   Hyperlipemia    Hypertension    Left testicular pain    Lumbago    Neoplasm of kidney    Renal cancer [facility])    Renal cyst    Right testicular pain    Tobacco abuse     Past Surgical History:  Procedure Laterality Date   COLONOSCOPY     FUDUCIAL PLACEMENT N/A 06/11/2018   Procedure: PLACEMENT OF FUDUCIAL;  Surgeon: Loreli Slot, MD;  Location: [facility] OR;  Service: Thoracic;  Laterality: N/A;   HERNIA REPAIR     inguinal   INNER EAR SURGERY     POLYPECTOMY     ROBOT ASSITED LAPAROSCOPIC NEPHROURETERECTOMY Right 09/07/2020   Procedure: XI ROBOT ASSITED LAPAROSCOPIC NEPHROURETERECTOMY/ CYSTOSCOPY/ STENT PLACEMENT/ TRANSURETHRAL RESECTION OF URETERAL ORIFICE;  Surgeon: Rene Paci, MD;  Location: [facility]S;  Service: Urology;  Laterality: Right;   VIDEO BRONCHOSCOPY WITH ENDOBRONCHIAL NAVIGATION N/A 06/11/2018   Procedure: VIDEO BRONCHOSCOPY WITH ENDOBRONCHIAL NAVIGATION;  Surgeon: Loreli Slot, MD;  Location: [facility] OR;  Service: Thoracic;  Laterality: N/A;   WISDOM TOOTH EXTRACTION      Social History [name]  reports that he has been smoking cigarettes. He has never used smokeless tobacco. He reports current alcohol use of about 2.0 standard drinks of alcohol per week. He reports that he does not use drugs.  family history includes  Alzheimer's disease in his mother; Hypothyroidism in his mother; Lung cancer in his father; Prostate cancer in his father; Severe combined immunodeficiency in his mother.  Allergies  Allergen Reactions   Neomycin-Bacitracin Zn-Polymyx Swelling and Rash       PHYSICAL EXAMINATION: Vital signs: BP 134/85   Pulse 72   Temp 98 F (36.7 C) (Skin)   Resp 17   Ht 6' (1.829 m)   Wt 155 lb (70.3 kg)   SpO2 100%   BMI 21.02 kg/m  General: Well-developed, well-nourished, no acute distress HEENT: Sclerae are anicteric, conjunctiva pink. Oral mucosa intact Lungs: Clear Heart: Regular Abdomen: soft, nontender, nondistended, no obvious ascites, no peritoneal signs, normal bowel sounds. No organomegaly. Extremities: No edema Psychiatric: alert and oriented x3. Cooperative     ASSESSMENT:  Personal history of multiple advanced adenomatous colon polyps   PLAN:  Surveillance colonoscopy

## 2022-10-03 NOTE — Progress Notes (Signed)
 Called to room to assist during endoscopic procedure.  Patient ID and intended procedure confirmed with present staff. Received instructions for my participation in the procedure from the performing physician.  

## 2022-10-03 NOTE — Progress Notes (Signed)
Patient states there have been no changes to medical or surgical history since time of pre-visit. 

## 2022-10-03 NOTE — Op Note (Signed)
[facility] Patient Name: Dylan Harvey Procedure Date: 10/03/2022 1:36 PM MRN: [id number] Endoscopist: Wilhemina Bonito. Marina Goodell , MD, [id number] Age: 72 Referring MD:  Date of Birth: 1950/10/05 Gender: Male Account #: [id number] Procedure:                Colonoscopy with cold snare polypectomy x 1 Indications:              High risk colon cancer surveillance: Personal                            history of adenoma (10 mm or greater in size), High                            risk colon cancer surveillance: Personal history of                            multiple (3 or more) adenomas. Previous                            examinations 2009, 2012, 2017 Medicines:                Monitored Anesthesia Care Procedure:                Pre-Anesthesia Assessment:                           - Prior to the procedure, a History and Physical                            was performed, and patient medications and                            allergies were reviewed. The patient's tolerance of                            previous anesthesia was also reviewed. The risks                            and benefits of the procedure and the sedation                            options and risks were discussed with the patient.                            All questions were answered, and informed consent                            was obtained. Prior Anticoagulants: The patient has                            taken no anticoagulant or antiplatelet agents. ASA                            Grade Assessment: II - A patient with mild systemic  disease. After reviewing the risks and benefits,                            the patient was deemed in satisfactory condition to                            undergo the procedure.                           After obtaining informed consent, the colonoscope                            was passed under direct vision. Throughout the                            procedure,  the patient's blood pressure, pulse, and                            oxygen saturations were monitored continuously. The                            Olympus Scope SN: [id number] was introduced through                            the anus and advanced to the the cecum, identified                            by appendiceal orifice and ileocecal valve. The                            ileocecal valve, appendiceal orifice, and rectum                            were photographed. The quality of the bowel                            preparation was excellent. The colonoscopy was                            performed without difficulty. The patient tolerated                            the procedure well. The bowel preparation used was                            SUPREP via split dose instruction. Scope In: 1:46:27 PM Scope Out: 2:05:04 PM Scope Withdrawal Time: 0 hours 13 minutes 2 seconds  Total Procedure Duration: 0 hours 18 minutes 37 seconds  Findings:                 A 3 mm polyp was found in the rectum. The polyp was                            removed with a cold snare. Resection  and retrieval                            were complete.                           Multiple diverticula were found in the sigmoid                            colon. Severe changes.                           Internal hemorrhoids were found during retroflexion.                           The exam was otherwise without abnormality on                            direct and retroflexion views. Complications:            No immediate complications. Estimated blood loss:                            None. Estimated Blood Loss:     Estimated blood loss: none. Impression:               - One 3 mm polyp in the rectum, removed with a cold                            snare. Resected and retrieved.                           - Diverticulosis in the sigmoid colon. Severe                            changes.                           - Internal  hemorrhoids.                           - The examination was otherwise normal on direct                            and retroflexion views. Recommendation:           - Repeat colonoscopy in 5 years for surveillance                            (personal history).                           - Patient has a contact number available for                            emergencies. The signs and symptoms of potential  delayed complications were discussed with the                            patient. Return to normal activities tomorrow.                            Written discharge instructions were provided to the                            patient.                           - Resume previous diet.                           - Continue present medications.                           - Await pathology results. Wilhemina Bonito. Marina Goodell, MD 10/03/2022 2:10:30 PM This report has been signed electronically.

## 2022-10-04 ENCOUNTER — Telehealth: Payer: Self-pay | Admitting: *Deleted

## 2022-10-04 NOTE — Telephone Encounter (Signed)
Post procedure follow up call placed, no answer and left VM.  

## 2022-10-08 DIAGNOSIS — Z9089 Acquired absence of other organs: Secondary | ICD-10-CM | POA: Diagnosis not present

## 2022-10-08 DIAGNOSIS — H903 Sensorineural hearing loss, bilateral: Secondary | ICD-10-CM | POA: Diagnosis not present

## 2022-10-08 DIAGNOSIS — H60391 Other infective otitis externa, right ear: Secondary | ICD-10-CM | POA: Diagnosis not present

## 2022-10-09 ENCOUNTER — Encounter: Payer: Self-pay | Admitting: Internal Medicine

## 2022-10-29 DIAGNOSIS — Z85118 Personal history of other malignant neoplasm of bronchus and lung: Secondary | ICD-10-CM | POA: Insufficient documentation

## 2022-10-29 DIAGNOSIS — J449 Chronic obstructive pulmonary disease, unspecified: Secondary | ICD-10-CM | POA: Insufficient documentation

## 2022-10-29 DIAGNOSIS — F17210 Nicotine dependence, cigarettes, uncomplicated: Secondary | ICD-10-CM | POA: Insufficient documentation

## 2022-10-29 DIAGNOSIS — J432 Centrilobular emphysema: Secondary | ICD-10-CM | POA: Diagnosis not present

## 2022-11-05 DIAGNOSIS — E785 Hyperlipidemia, unspecified: Secondary | ICD-10-CM | POA: Diagnosis not present

## 2022-11-05 DIAGNOSIS — I10 Essential (primary) hypertension: Secondary | ICD-10-CM | POA: Diagnosis not present

## 2022-11-05 DIAGNOSIS — I2510 Atherosclerotic heart disease of native coronary artery without angina pectoris: Secondary | ICD-10-CM | POA: Diagnosis not present

## 2022-11-05 DIAGNOSIS — J449 Chronic obstructive pulmonary disease, unspecified: Secondary | ICD-10-CM | POA: Diagnosis not present

## 2022-11-05 DIAGNOSIS — Z23 Encounter for immunization: Secondary | ICD-10-CM | POA: Diagnosis not present

## 2022-11-05 DIAGNOSIS — E875 Hyperkalemia: Secondary | ICD-10-CM | POA: Diagnosis not present

## 2022-11-05 DIAGNOSIS — F17200 Nicotine dependence, unspecified, uncomplicated: Secondary | ICD-10-CM | POA: Diagnosis not present

## 2022-11-05 DIAGNOSIS — R011 Cardiac murmur, unspecified: Secondary | ICD-10-CM | POA: Diagnosis not present

## 2022-11-05 DIAGNOSIS — I7090 Unspecified atherosclerosis: Secondary | ICD-10-CM | POA: Diagnosis not present

## 2022-11-05 DIAGNOSIS — E871 Hypo-osmolality and hyponatremia: Secondary | ICD-10-CM | POA: Diagnosis not present

## 2022-11-05 DIAGNOSIS — K5730 Diverticulosis of large intestine without perforation or abscess without bleeding: Secondary | ICD-10-CM | POA: Diagnosis not present

## 2022-11-05 DIAGNOSIS — R7301 Impaired fasting glucose: Secondary | ICD-10-CM | POA: Diagnosis not present

## 2022-11-05 DIAGNOSIS — C3490 Malignant neoplasm of unspecified part of unspecified bronchus or lung: Secondary | ICD-10-CM | POA: Diagnosis not present

## 2022-11-26 DIAGNOSIS — Z9089 Acquired absence of other organs: Secondary | ICD-10-CM | POA: Diagnosis not present

## 2022-11-26 DIAGNOSIS — H60391 Other infective otitis externa, right ear: Secondary | ICD-10-CM | POA: Diagnosis not present

## 2022-11-26 DIAGNOSIS — H903 Sensorineural hearing loss, bilateral: Secondary | ICD-10-CM | POA: Diagnosis not present

## 2022-12-12 DIAGNOSIS — J449 Chronic obstructive pulmonary disease, unspecified: Secondary | ICD-10-CM | POA: Diagnosis not present

## 2022-12-12 DIAGNOSIS — J432 Centrilobular emphysema: Secondary | ICD-10-CM | POA: Diagnosis not present

## 2022-12-24 ENCOUNTER — Ambulatory Visit (HOSPITAL_COMMUNITY)
Admission: RE | Admit: 2022-12-24 | Discharge: 2022-12-24 | Disposition: A | Discharge status: Home / Self Care | Payer: Medicare Other | Source: Ambulatory Visit | Attending: Radiation Oncology | Admitting: Radiation Oncology

## 2022-12-24 DIAGNOSIS — C3490 Malignant neoplasm of unspecified part of unspecified bronchus or lung: Secondary | ICD-10-CM | POA: Diagnosis not present

## 2022-12-24 DIAGNOSIS — I700 Atherosclerosis of aorta: Secondary | ICD-10-CM | POA: Diagnosis not present

## 2022-12-24 DIAGNOSIS — C3432 Malignant neoplasm of lower lobe, left bronchus or lung: Secondary | ICD-10-CM | POA: Insufficient documentation

## 2022-12-24 DIAGNOSIS — J432 Centrilobular emphysema: Secondary | ICD-10-CM | POA: Diagnosis not present

## 2022-12-25 ENCOUNTER — Other Ambulatory Visit (HOSPITAL_COMMUNITY): Payer: Medicare Other

## 2022-12-27 ENCOUNTER — Ambulatory Visit: Payer: Self-pay | Admitting: Radiation Oncology

## 2022-12-29 NOTE — Progress Notes (Signed)
Radiation Oncology         [phone] ________________________________  Name: Dylan Harvey MRN: [id number]  Date: 12/31/2022  DOB: 1950-04-10  Follow-Up Visit Note  CC: Rodrigo Ran, MD  Rodrigo Ran, MD  No diagnosis found.  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 He was last seen here for follow-up on 06/25/22.   His most recent chest CT without contrast on 12/24/22 shows stable treatment changes in the left perihilar region and stable bilateral pulmonary nodules without evidence of local recurrence or metastatic disease. A stable mildly prominent right hilar lymph node was also demonstrated.   Of note: he was recently recently established care with PA Caryn Bee at [facility] on 10/29/22 in the setting of COPD/emphysema.  ***                          Allergies:  is allergic to neomycin-bacitracin zn-polymyx.  Meds: Current Outpatient Medications  Medication Sig Dispense Refill   ALFALFA PO Take by mouth daily.     amLODipine (NORVASC) 5 MG tablet Take 5 mg by mouth daily.     Ascorbic Acid (VITAMIN C) 1000 MG tablet Take 1,000 mg by mouth daily.     b complex vitamins tablet Take 1 tablet by mouth daily.     cholecalciferol (VITAMIN D) 1000 units tablet Take 1,000 Units by mouth daily.     ezetimibe (ZETIA) 10 MG tablet Take 10 mg by mouth daily.     fexofenadine (ALLEGRA) 60 MG tablet Take 60 mg by mouth daily.     Flaxseed, Linseed, (FLAX SEED OIL PO) Take by mouth daily.     Omega-3 Fatty Acids (FISH OIL) 1000 MG CAPS Take by mouth daily.      PRESCRIPTION MEDICATION Place 1 capsule into the right ear every other day. Sulfacetamide amphoteracin b - chloramphenincol - hydrocortisone     PSYLLIUM FIBER PO Take by mouth daily.     sodium chloride (OCEAN) 0.65 % SOLN nasal spray Place 1 spray into the nose daily.     No current facility-administered medications for this encounter.    Physical Findings: The patient is in no acute distress. Patient is alert and oriented.  vitals were not taken for this visit. .  No significant changes. Lungs are clear to auscultation bilaterally. Heart has regular rate and rhythm. No palpable cervical, supraclavicular, or axillary adenopathy. Abdomen soft, non-tender, normal bowel sounds.   Lab Findings: Lab Results  Component Value Date   WBC 7.4 12/04/2021   HGB 14.0 12/04/2021   HCT 39.9 12/04/2021   MCV 92.6 12/04/2021   PLT 181 12/04/2021    Radiographic Findings: CT CHEST WO CONTRAST  Result Date: 12/28/2022 CLINICAL DATA:  Non-small cell lung cancer. Assess treatment response. * Tracking Code: BO * EXAM: CT CHEST WITHOUT CONTRAST TECHNIQUE: Multidetector CT imaging of the chest was performed following the standard protocol without IV contrast. RADIATION DOSE REDUCTION: This exam was performed according to the departmental dose-optimization program which includes automated exposure control, adjustment of the mA and/or  kV according to patient size and/or use of iterative reconstruction technique. COMPARISON:  Chest CT 06/06/2022 and 12/11/2021. FINDINGS: Cardiovascular: Atherosclerosis of the aorta, great vessels and coronary arteries. Prominent calcifications of the mitral annulus again noted. The heart size is normal. There is no pericardial effusion. Mediastinum/Nodes: Previously demonstrated 1.0 cm short axis right hilar node on image 80/2 is stable. No other enlarged mediastinal, hilar or axillary lymph nodes identified. Hilar assessment is limited by the lack of intravenous contrast,  although the hilar contours appear unchanged. The thyroid gland, trachea and esophagus demonstrate no significant findings. Lungs/Pleura: No pleural effusion or pneumothorax. Moderate centrilobular and paraseptal emphysema. Stable treatment changes in the left perihilar region around the fiducial markers with band like opacities best appreciated on the sagittal images. No evidence of local recurrence. Stable benign subpleural 3 mm nodule in the left lower lobe on image 102/6. There are other stable scattered nodules, including a 5 mm left lower lobe nodule on image 134/6. No new or enlarging nodules. Upper abdomen: Previous right nephrectomy. Scattered calcified lymph nodes and vascular calcifications. No suspicious findings. Musculoskeletal/Chest wall: There is no chest wall mass or suspicious osseous finding. Mild multilevel spondylosis. Unless specific follow-up recommendations are mentioned in the findings or impression sections, no imaging follow-up of any mentioned incidental findings is recommended. IMPRESSION: 1. Stable treatment changes in the left perihilar region. No evidence of local recurrence or metastatic disease. 2. Stable small pulmonary nodules bilaterally, consistent with benign findings. 3. Stable mildly prominent right hilar lymph node. 4. Aortic Atherosclerosis (ICD10-I70.0) and Emphysema (ICD10-J43.9). Electronically Signed   By: Carey Bullocks M.D.   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  ***  Plan:  ***   *** minutes of total time was spent for this patient encounter, including preparation, face-to-face counseling with the patient and coordination of care, physical exam, and documentation of the encounter. ____________________________________  Billie Lade, PhD, MD  This document serves as a record of services  personally performed by Antony Blackbird, MD. It was created on his behalf by Neena Rhymes, a trained medical scribe. The creation of this record is based on the scribe's personal observations and the provider's statements to them. This document has been checked and approved by the attending provider.

## 2022-12-31 ENCOUNTER — Encounter: Payer: Self-pay | Admitting: Radiation Oncology

## 2022-12-31 ENCOUNTER — Ambulatory Visit
Admission: RE | Admit: 2022-12-31 | Discharge: 2022-12-31 | Disposition: A | Discharge status: Home / Self Care | Payer: Medicare Other | Source: Ambulatory Visit | Attending: Radiation Oncology | Admitting: Radiation Oncology

## 2022-12-31 VITALS — BP 157/63 | HR 47 | Temp 97.2°F | Resp 18 | Wt 158.0 lb

## 2022-12-31 DIAGNOSIS — I700 Atherosclerosis of aorta: Secondary | ICD-10-CM | POA: Diagnosis not present

## 2022-12-31 DIAGNOSIS — Z79899 Other long term (current) drug therapy: Secondary | ICD-10-CM | POA: Insufficient documentation

## 2022-12-31 DIAGNOSIS — Z923 Personal history of irradiation: Secondary | ICD-10-CM | POA: Diagnosis not present

## 2022-12-31 DIAGNOSIS — Z85118 Personal history of other malignant neoplasm of bronchus and lung: Secondary | ICD-10-CM | POA: Diagnosis not present

## 2022-12-31 DIAGNOSIS — C3432 Malignant neoplasm of lower lobe, left bronchus or lung: Secondary | ICD-10-CM

## 2022-12-31 DIAGNOSIS — J432 Centrilobular emphysema: Secondary | ICD-10-CM | POA: Insufficient documentation

## 2022-12-31 NOTE — Progress Notes (Signed)
Dylan Harvey is here today for follow up post radiation to the lung.  Lung Side: Left, patient completed treatment on 01/25/20.  Does the patient complain of any of the following: Pain: No Shortness of breath w/wo exertion: No Cough: No Hemoptysis: No Pain with swallowing: No Swallowing/choking concerns: No Appetite: Good Energy Level: good Post radiation skin Changes: no    Additional comments if applicable:  BP (!) 157/63   Pulse (!) 47   Temp (!) 97.2 F (36.2 C)   Resp 18   Wt 158 lb (71.7 kg)   SpO2 100%   BMI 21.43 kg/m

## 2023-01-14 DIAGNOSIS — Z9089 Acquired absence of other organs: Secondary | ICD-10-CM | POA: Diagnosis not present

## 2023-01-14 DIAGNOSIS — H60391 Other infective otitis externa, right ear: Secondary | ICD-10-CM | POA: Diagnosis not present

## 2023-01-14 DIAGNOSIS — H903 Sensorineural hearing loss, bilateral: Secondary | ICD-10-CM | POA: Diagnosis not present

## 2023-01-14 DIAGNOSIS — R972 Elevated prostate specific antigen [PSA]: Secondary | ICD-10-CM | POA: Diagnosis not present

## 2023-01-21 DIAGNOSIS — C651 Malignant neoplasm of right renal pelvis: Secondary | ICD-10-CM | POA: Diagnosis not present

## 2023-01-21 DIAGNOSIS — R972 Elevated prostate specific antigen [PSA]: Secondary | ICD-10-CM | POA: Diagnosis not present

## 2023-02-06 DIAGNOSIS — N1832 Chronic kidney disease, stage 3b: Secondary | ICD-10-CM | POA: Diagnosis not present

## 2023-02-13 DIAGNOSIS — N1832 Chronic kidney disease, stage 3b: Secondary | ICD-10-CM | POA: Diagnosis not present

## 2023-02-13 DIAGNOSIS — Z720 Tobacco use: Secondary | ICD-10-CM | POA: Diagnosis not present

## 2023-02-13 DIAGNOSIS — I129 Hypertensive chronic kidney disease with stage 1 through stage 4 chronic kidney disease, or unspecified chronic kidney disease: Secondary | ICD-10-CM | POA: Diagnosis not present

## 2023-02-13 DIAGNOSIS — E785 Hyperlipidemia, unspecified: Secondary | ICD-10-CM | POA: Diagnosis not present

## 2023-02-13 DIAGNOSIS — Z85528 Personal history of other malignant neoplasm of kidney: Secondary | ICD-10-CM | POA: Diagnosis not present

## 2023-02-26 DIAGNOSIS — R5383 Other fatigue: Secondary | ICD-10-CM | POA: Diagnosis not present

## 2023-02-26 DIAGNOSIS — J029 Acute pharyngitis, unspecified: Secondary | ICD-10-CM | POA: Diagnosis not present

## 2023-02-26 DIAGNOSIS — E785 Hyperlipidemia, unspecified: Secondary | ICD-10-CM | POA: Diagnosis not present

## 2023-02-26 DIAGNOSIS — J3489 Other specified disorders of nose and nasal sinuses: Secondary | ICD-10-CM | POA: Diagnosis not present

## 2023-02-26 DIAGNOSIS — Z1152 Encounter for screening for COVID-19: Secondary | ICD-10-CM | POA: Diagnosis not present

## 2023-02-26 DIAGNOSIS — U071 COVID-19: Secondary | ICD-10-CM | POA: Diagnosis not present

## 2023-02-26 DIAGNOSIS — R058 Other specified cough: Secondary | ICD-10-CM | POA: Diagnosis not present

## 2023-03-04 DIAGNOSIS — H60391 Other infective otitis externa, right ear: Secondary | ICD-10-CM | POA: Diagnosis not present

## 2023-03-04 DIAGNOSIS — Z9089 Acquired absence of other organs: Secondary | ICD-10-CM | POA: Diagnosis not present

## 2023-03-04 DIAGNOSIS — H6122 Impacted cerumen, left ear: Secondary | ICD-10-CM | POA: Diagnosis not present

## 2023-04-15 DIAGNOSIS — H60391 Other infective otitis externa, right ear: Secondary | ICD-10-CM | POA: Diagnosis not present

## 2023-04-15 DIAGNOSIS — Z9089 Acquired absence of other organs: Secondary | ICD-10-CM | POA: Diagnosis not present

## 2023-04-29 DIAGNOSIS — I10 Essential (primary) hypertension: Secondary | ICD-10-CM | POA: Diagnosis not present

## 2023-04-29 DIAGNOSIS — E785 Hyperlipidemia, unspecified: Secondary | ICD-10-CM | POA: Diagnosis not present

## 2023-04-29 DIAGNOSIS — E871 Hypo-osmolality and hyponatremia: Secondary | ICD-10-CM | POA: Diagnosis not present

## 2023-04-29 DIAGNOSIS — Z1212 Encounter for screening for malignant neoplasm of rectum: Secondary | ICD-10-CM | POA: Diagnosis not present

## 2023-04-29 DIAGNOSIS — I2510 Atherosclerotic heart disease of native coronary artery without angina pectoris: Secondary | ICD-10-CM | POA: Diagnosis not present

## 2023-04-29 DIAGNOSIS — R319 Hematuria, unspecified: Secondary | ICD-10-CM | POA: Diagnosis not present

## 2023-04-30 ENCOUNTER — Telehealth: Payer: Self-pay | Admitting: *Deleted

## 2023-04-30 NOTE — Telephone Encounter (Signed)
CALLED PATIENT TO ASK QUESTION, LVM FOR A RETURN CALL

## 2023-05-01 ENCOUNTER — Telehealth: Payer: Self-pay | Admitting: *Deleted

## 2023-05-01 NOTE — Telephone Encounter (Signed)
CALLED PATIENT TO INFORM OF CT FOR 07-01-23- ARRIVAL TIME- 3 PM @ [facility], NO RESTRICTIONS TO SCAN, PATIENT TO RECEIVE RESULTS FROM DR.KINARD ON 07-08-23 @ 3 PM,LVM FOR A RETURN CALL

## 2023-05-02 DIAGNOSIS — R82998 Other abnormal findings in urine: Secondary | ICD-10-CM | POA: Diagnosis not present

## 2023-05-06 DIAGNOSIS — K2100 Gastro-esophageal reflux disease with esophagitis, without bleeding: Secondary | ICD-10-CM | POA: Diagnosis not present

## 2023-05-06 DIAGNOSIS — Z8553 Personal history of malignant neoplasm of renal pelvis: Secondary | ICD-10-CM | POA: Diagnosis not present

## 2023-05-06 DIAGNOSIS — I2510 Atherosclerotic heart disease of native coronary artery without angina pectoris: Secondary | ICD-10-CM | POA: Diagnosis not present

## 2023-05-06 DIAGNOSIS — R809 Proteinuria, unspecified: Secondary | ICD-10-CM | POA: Diagnosis not present

## 2023-05-06 DIAGNOSIS — I10 Essential (primary) hypertension: Secondary | ICD-10-CM | POA: Diagnosis not present

## 2023-05-06 DIAGNOSIS — Z905 Acquired absence of kidney: Secondary | ICD-10-CM | POA: Diagnosis not present

## 2023-05-06 DIAGNOSIS — C3490 Malignant neoplasm of unspecified part of unspecified bronchus or lung: Secondary | ICD-10-CM | POA: Diagnosis not present

## 2023-05-06 DIAGNOSIS — F17200 Nicotine dependence, unspecified, uncomplicated: Secondary | ICD-10-CM | POA: Diagnosis not present

## 2023-05-06 DIAGNOSIS — J449 Chronic obstructive pulmonary disease, unspecified: Secondary | ICD-10-CM | POA: Diagnosis not present

## 2023-05-06 DIAGNOSIS — I7090 Unspecified atherosclerosis: Secondary | ICD-10-CM | POA: Diagnosis not present

## 2023-05-06 DIAGNOSIS — Z0000 Encounter for general adult medical examination without abnormal findings: Secondary | ICD-10-CM | POA: Diagnosis not present

## 2023-05-06 DIAGNOSIS — E785 Hyperlipidemia, unspecified: Secondary | ICD-10-CM | POA: Diagnosis not present

## 2023-05-27 DIAGNOSIS — Z9089 Acquired absence of other organs: Secondary | ICD-10-CM | POA: Diagnosis not present

## 2023-05-27 DIAGNOSIS — H6121 Impacted cerumen, right ear: Secondary | ICD-10-CM | POA: Diagnosis not present

## 2023-05-27 DIAGNOSIS — H60391 Other infective otitis externa, right ear: Secondary | ICD-10-CM | POA: Diagnosis not present

## 2023-07-01 ENCOUNTER — Ambulatory Visit (HOSPITAL_COMMUNITY)
Admission: RE | Admit: 2023-07-01 | Discharge: 2023-07-01 | Disposition: A | Discharge status: Home / Self Care | Payer: Medicare Other | Source: Ambulatory Visit | Attending: Radiation Oncology | Admitting: Radiation Oncology

## 2023-07-01 ENCOUNTER — Encounter (HOSPITAL_COMMUNITY): Payer: Self-pay

## 2023-07-01 DIAGNOSIS — I700 Atherosclerosis of aorta: Secondary | ICD-10-CM | POA: Diagnosis not present

## 2023-07-01 DIAGNOSIS — C3432 Malignant neoplasm of lower lobe, left bronchus or lung: Secondary | ICD-10-CM | POA: Insufficient documentation

## 2023-07-01 DIAGNOSIS — J432 Centrilobular emphysema: Secondary | ICD-10-CM | POA: Diagnosis not present

## 2023-07-01 DIAGNOSIS — C3490 Malignant neoplasm of unspecified part of unspecified bronchus or lung: Secondary | ICD-10-CM | POA: Diagnosis not present

## 2023-07-07 NOTE — Progress Notes (Signed)
 Radiation Oncology         [phone] ________________________________  Name: Dylan Harvey MRN: [id number]  Date: 07/08/2023  DOB: [birth year]  Follow-Up Visit Note  CC: Aldo Hun, MD  Aldo Hun, MD  No diagnosis found.  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 He was last seen here for follow-up on 12/31/22.  His most recent chest CT without contrast on 07/01/23 demonstrates a new 7 mm irregular nodular opacity in the anterior right upper lobe, as well as a new left lower lobe pulmonary nodule measuring 6 mm. Both of these appear nonspecific and may represent an infectious or inflammatory etiology, however close attention to follow-up is still warranted. CT findings otherwise show stability of the additional tiny left-sided pulmonary nodules measuring up to 5 mm, and stable post-treatment scarring in the parahilar left lung.           No other significant interval history since the patient was last seen for follow-up.   ***                      Allergies:  is allergic to neomycin-bacitracin zn-polymyx.  Meds: Current Outpatient Medications  Medication Sig Dispense Refill   ALFALFA PO Take by mouth daily.     amLODipine (NORVASC) 5 MG tablet Take 5 mg by mouth daily.     Ascorbic Acid (VITAMIN C) 1000 MG tablet Take 1,000 mg by mouth daily.     b complex vitamins tablet Take 1 tablet by mouth daily.     cholecalciferol (VITAMIN D) 1000 units tablet Take 1,000 Units by mouth daily.     ezetimibe (ZETIA) 10 MG tablet Take 10 mg by mouth daily.      fexofenadine (ALLEGRA) 60 MG tablet Take 60 mg by mouth daily.     Flaxseed, Linseed, (FLAX SEED OIL PO) Take by mouth daily.     Omega-3 Fatty Acids (FISH OIL) 1000 MG CAPS Take by mouth daily.     PRESCRIPTION MEDICATION Place 1 capsule into the right ear every other day. Sulfacetamide amphoteracin b - chloramphenincol - hydrocortisone     PSYLLIUM FIBER PO Take by mouth daily.     sodium chloride  (OCEAN) 0.65 % SOLN nasal spray Place 1 spray into the nose daily.     No current facility-administered medications for this encounter.    Physical Findings: The patient is in no acute distress. Patient is alert and oriented.  vitals were not taken for this visit. .  No significant changes. Lungs are clear to auscultation bilaterally. Heart has regular rate and rhythm. No palpable cervical, supraclavicular, or axillary adenopathy. Abdomen soft, non-tender, normal bowel sounds.   Lab Findings: Lab Results  Component Value Date   WBC 7.4 12/04/2021   HGB 14.0 12/04/2021   HCT 39.9 12/04/2021   MCV 92.6 12/04/2021   PLT 181 12/04/2021    Radiographic Findings: CT CHEST WO CONTRAST Result Date: 07/06/2023 CLINICAL DATA:  Non-small-cell lung cancer. Restaging. * Tracking Code: BO * EXAM: CT CHEST WITHOUT CONTRAST TECHNIQUE: Multidetector CT imaging of the  chest was performed following the standard protocol without IV contrast. RADIATION DOSE REDUCTION: This exam was performed according to the departmental dose-optimization program which includes automated exposure control, adjustment of the mA and/or kV according to patient size and/or use of iterative reconstruction technique. COMPARISON:  12/24/2022 FINDINGS: Cardiovascular: The heart size is normal. No substantial pericardial effusion. Coronary artery calcification is evident. Mitral annular calcification evident. Mild atherosclerotic calcification is noted in the wall of the thoracic aorta. Mediastinum/Nodes: No mediastinal lymphadenopathy.  Upper normal 9-10 mm short axis right hilar lymph node identified previously is unchanged in the interval. No evidence for gross hilar lymphadenopathy although assessment is limited by the lack of intravenous contrast on the current study. The esophagus has normal imaging features. There is no axillary lymphadenopathy. Lungs/Pleura: Centrilobular and paraseptal emphysema evident. New 7 mm irregular nodular opacity identified anterior right upper lobe on image 91/8. 6 mm left lower lobe nodule on 131/8 is new since prior. Post treatment scarring in the parahilar left lung is stable in the interval. 5 mm posterior left lower lobe nodule on 137/8 is stable since prior. 3 mm left lower lobe nodule on 120/8 is stable. No pleural effusion. Upper Abdomen: Calcified lymph nodes in the central aspect of the upper abdomen again noted, stable and most likely benign. Musculoskeletal: No worrisome lytic or sclerotic osseous abnormality. IMPRESSION: 1. New 7 mm irregular nodular opacity anterior right upper lobe with another new left lower lobe pulmonary nodule measuring 6 mm today. Imaging features are nonspecific and may be infectious/inflammatory but close interval follow-up is warranted. 2. Additional stable tiny left-sided pulmonary nodules measuring up to 5 mm. 3. Stable post treatment scarring in the parahilar left lung. 4.  Emphysema (ICD10-J43.9) and Aortic Atherosclerosis (ICD10-170.0) Electronically Signed   By: Donnal Fusi M.D.   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  ***  Plan:  ***   *** minutes of total time was spent for this patient encounter, including preparation, face-to-face counseling with the patient and coordination of care, physical exam, and documentation of the encounter. ____________________________________  Noralee Beam, PhD,  MD  This document serves as a record of services personally performed by Retta Caster, MD. It was created on his behalf by Aleta Anda, a trained medical scribe. The creation of this record is based on the scribe's personal observations and the provider's statements to them. This document has been checked and approved by the attending provider.

## 2023-07-08 ENCOUNTER — Ambulatory Visit
Admission: RE | Admit: 2023-07-08 | Discharge: 2023-07-08 | Disposition: A | Discharge status: Home / Self Care | Payer: Self-pay | Source: Ambulatory Visit | Attending: Radiation Oncology | Admitting: Radiation Oncology

## 2023-07-08 ENCOUNTER — Encounter: Payer: Self-pay | Admitting: Radiation Oncology

## 2023-07-08 VITALS — BP 147/68 | HR 49 | Temp 97.3°F | Resp 18 | Ht 72.0 in | Wt 162.2 lb

## 2023-07-08 DIAGNOSIS — C3432 Malignant neoplasm of lower lobe, left bronchus or lung: Secondary | ICD-10-CM

## 2023-07-08 NOTE — Progress Notes (Signed)
 Dylan Harvey is here today for follow up post radiation to the lung.  Lung Side: Left  Does the patient complain of any of the following: Pain:Denies Shortness of breath w/wo exertion: Denies Cough: Denies Hemoptysis: Denies Pain with swallowing: Denies Swallowing/choking concerns: Denies Appetite: Good Energy Level: Good Post radiation skin Changes: Denies  BP (!) 147/68 (BP Location: Left Arm, Patient Position: Sitting)   Pulse (!) 49   Temp (!) 97.3 F (36.3 C) (Temporal)   Resp 18   Ht 6' (1.829 m)   Wt 162 lb 4 oz (73.6 kg)   SpO2 100%   BMI 22.01 kg/m      Additional comments if applicable:

## 2023-07-15 DIAGNOSIS — Z9089 Acquired absence of other organs: Secondary | ICD-10-CM | POA: Diagnosis not present

## 2023-07-15 DIAGNOSIS — H60391 Other infective otitis externa, right ear: Secondary | ICD-10-CM | POA: Diagnosis not present

## 2023-07-22 DIAGNOSIS — C651 Malignant neoplasm of right renal pelvis: Secondary | ICD-10-CM | POA: Diagnosis not present

## 2023-07-29 ENCOUNTER — Telehealth: Payer: Self-pay | Admitting: *Deleted

## 2023-07-29 NOTE — Telephone Encounter (Signed)
 CALLED PATIENT TO INFORM OF CT FOR 10-07-23- ARRIVAL TIME- 4:15 PM @ [facility], NO RESTRICTIONS TO SCAN, PATIENT TO RECEIVE RESULTS FROM DR. KINARD ON 10/14/23 @ 4 PM, LVM FOR A RETURN CALL

## 2023-07-31 DIAGNOSIS — C651 Malignant neoplasm of right renal pelvis: Secondary | ICD-10-CM | POA: Diagnosis not present

## 2023-07-31 DIAGNOSIS — Z905 Acquired absence of kidney: Secondary | ICD-10-CM | POA: Diagnosis not present

## 2023-07-31 DIAGNOSIS — N400 Enlarged prostate without lower urinary tract symptoms: Secondary | ICD-10-CM | POA: Diagnosis not present

## 2023-07-31 DIAGNOSIS — C3492 Malignant neoplasm of unspecified part of left bronchus or lung: Secondary | ICD-10-CM | POA: Diagnosis not present

## 2023-07-31 DIAGNOSIS — K5730 Diverticulosis of large intestine without perforation or abscess without bleeding: Secondary | ICD-10-CM | POA: Diagnosis not present

## 2023-08-26 DIAGNOSIS — Z9089 Acquired absence of other organs: Secondary | ICD-10-CM | POA: Diagnosis not present

## 2023-08-26 DIAGNOSIS — H60391 Other infective otitis externa, right ear: Secondary | ICD-10-CM | POA: Diagnosis not present

## 2023-09-16 DIAGNOSIS — R972 Elevated prostate specific antigen [PSA]: Secondary | ICD-10-CM | POA: Diagnosis not present

## 2023-09-16 DIAGNOSIS — C651 Malignant neoplasm of right renal pelvis: Secondary | ICD-10-CM | POA: Diagnosis not present

## 2023-09-18 ENCOUNTER — Other Ambulatory Visit: Payer: Self-pay | Admitting: Urology

## 2023-09-18 DIAGNOSIS — R972 Elevated prostate specific antigen [PSA]: Secondary | ICD-10-CM

## 2023-10-07 ENCOUNTER — Ambulatory Visit
Admission: RE | Admit: 2023-10-07 | Discharge: 2023-10-07 | Disposition: A | Discharge status: Home / Self Care | Source: Ambulatory Visit | Attending: Radiology | Admitting: Radiology

## 2023-10-07 DIAGNOSIS — H60391 Other infective otitis externa, right ear: Secondary | ICD-10-CM | POA: Diagnosis not present

## 2023-10-07 DIAGNOSIS — C3432 Malignant neoplasm of lower lobe, left bronchus or lung: Secondary | ICD-10-CM | POA: Diagnosis present

## 2023-10-07 DIAGNOSIS — C3490 Malignant neoplasm of unspecified part of unspecified bronchus or lung: Secondary | ICD-10-CM | POA: Diagnosis not present

## 2023-10-07 DIAGNOSIS — J432 Centrilobular emphysema: Secondary | ICD-10-CM | POA: Diagnosis not present

## 2023-10-07 DIAGNOSIS — Z9089 Acquired absence of other organs: Secondary | ICD-10-CM | POA: Diagnosis not present

## 2023-10-14 ENCOUNTER — Ambulatory Visit
Admission: RE | Admit: 2023-10-14 | Discharge: 2023-10-14 | Disposition: A | Discharge status: Home / Self Care | Source: Ambulatory Visit | Attending: Radiation Oncology | Admitting: Radiation Oncology

## 2023-10-14 ENCOUNTER — Encounter: Payer: Self-pay | Admitting: Radiation Oncology

## 2023-10-14 VITALS — BP 147/65 | HR 45 | Temp 97.7°F | Resp 20 | Ht 72.0 in | Wt 158.2 lb

## 2023-10-14 DIAGNOSIS — C3432 Malignant neoplasm of lower lobe, left bronchus or lung: Secondary | ICD-10-CM

## 2023-10-14 DIAGNOSIS — J432 Centrilobular emphysema: Secondary | ICD-10-CM | POA: Diagnosis not present

## 2023-10-14 DIAGNOSIS — I700 Atherosclerosis of aorta: Secondary | ICD-10-CM | POA: Insufficient documentation

## 2023-10-14 DIAGNOSIS — Z79899 Other long term (current) drug therapy: Secondary | ICD-10-CM | POA: Diagnosis not present

## 2023-10-14 DIAGNOSIS — Z85118 Personal history of other malignant neoplasm of bronchus and lung: Secondary | ICD-10-CM | POA: Diagnosis not present

## 2023-10-14 DIAGNOSIS — I3481 Nonrheumatic mitral (valve) annulus calcification: Secondary | ICD-10-CM | POA: Insufficient documentation

## 2023-10-14 DIAGNOSIS — R918 Other nonspecific abnormal finding of lung field: Secondary | ICD-10-CM | POA: Diagnosis not present

## 2023-10-14 DIAGNOSIS — Z923 Personal history of irradiation: Secondary | ICD-10-CM | POA: Diagnosis not present

## 2023-10-14 NOTE — Progress Notes (Signed)
 Dylan Harvey is here today for follow up post radiation to the lung.  Lung Side: left,patient completed treatment on 01/25/20.  Does the patient complain of any of the following: Pain: No Shortness of breath w/wo exertion: No Cough: No Hemoptysis: No Pain with swallowing: No Swallowing/choking concerns: No Appetite: Good Energy Level: Good Post radiation skin Changes: No    Additional comments if applicable:  BP (!) 147/65 (BP Location: Right Arm, Patient Position: Sitting, Cuff Size: Normal)   Pulse (!) 45   Temp 97.7 F (36.5 C)   Resp 20   Ht 6' (1.829 m)   Wt 158 lb 3.2 oz (71.8 kg)   SpO2 97%   BMI 21.46 kg/m

## 2023-10-14 NOTE — Progress Notes (Signed)
 Radiation Oncology         [phone] ________________________________  Name: Dylan Harvey MRN: [id number]  Date: 10/14/2023  DOB: 14-Jan-1951  Follow-Up Visit Note  CC: Shayne Anes, MD  Shayne Anes, MD  No diagnosis found.  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 He was last seen here for follow-up on 07/08/22.  His most recent chest CT on 10/07/23 demonstrates: an increase in size of the peripheral irregular nodular opacity in the RUL measuring 11 x 7 mm (previously measuring 7 mm). Imaging otherwise shows stable post treatment scaring in the perihilar left lung (without any new areas of nodularity present); and stability of the 9 mm short axis lymph node in the right hilum.       No other significant oncologic interval history since the patient was last seen for follow-up.   Of note: he has continued to follow with [facility] in the setting of his history of a right mastoidectomy and chronic OE.                           ***  Allergies:  is allergic to neomycin-bacitracin zn-polymyx.  Meds: Current Outpatient Medications  Medication Sig Dispense Refill   ALFALFA PO Take by mouth daily.     amLODipine (NORVASC) 5 MG tablet Take 5 mg by mouth daily.     Ascorbic Acid (VITAMIN C) 1000 MG tablet Take 1,000 mg by mouth daily.     b complex vitamins tablet Take 1 tablet by mouth daily.     cholecalciferol (VITAMIN D) 1000 units tablet Take 1,000 Units by mouth daily.     ezetimibe (ZETIA) 10 MG tablet Take 10 mg by mouth daily.     fexofenadine (ALLEGRA) 60  MG tablet Take 60 mg by mouth daily.     Flaxseed, Linseed, (FLAX SEED OIL PO) Take by mouth daily.     Omega-3 Fatty Acids (FISH OIL) 1000 MG CAPS Take by mouth daily.     PRESCRIPTION MEDICATION Place 1 capsule into the right ear every other day. Sulfacetamide amphoteracin b - chloramphenincol - hydrocortisone     PSYLLIUM FIBER PO Take by mouth daily.     sodium chloride  (OCEAN) 0.65 % SOLN nasal spray Place 1 spray into the nose daily.     No current facility-administered medications for this encounter.    Physical Findings: The patient is in no acute distress. Patient is alert and oriented.  vitals were not taken for this visit. .  No significant changes. Lungs are clear to auscultation bilaterally. Heart has regular rate and rhythm. No palpable cervical, supraclavicular, or axillary adenopathy. Abdomen soft, non-tender, normal bowel sounds.   Lab Findings: Lab Results  Component Value Date   WBC 7.4 12/04/2021   HGB 14.0 12/04/2021   HCT 39.9 12/04/2021   MCV 92.6 12/04/2021   PLT 181 12/04/2021    Radiographic Findings: CT CHEST WO CONTRAST Result Date: 10/10/2023 CLINICAL DATA:  History of non-small cell lung cancer, monitor. * Tracking Code: BO * EXAM:  CT CHEST WITHOUT CONTRAST TECHNIQUE: Multidetector CT imaging of the chest was performed following the standard protocol without IV contrast. RADIATION DOSE REDUCTION: This exam was performed according to the departmental dose-optimization program which includes automated exposure control, adjustment of the mA and/or kV according to patient size and/or use of iterative reconstruction technique. COMPARISON:  Multiple priors including most recent chest CT June 30, 2013 FINDINGS: Cardiovascular: Aortic atherosclerosis. Coronary artery calcifications. Normal size heart. Calcifications of the mitral annulus. No significant pericardial effusion/thickening. Mediastinum/Nodes: Stable 9 mm short axis lymph node in the right hilum on image  96/2. No suspicious thyroid  nodule.  Esophagus is grossly unremarkable. Lungs/Pleura: Centrilobular and paraseptal emphysema. Similar post treatment scarring in the perihilar left lung without new nodularity. Increased size of the peripheral irregular nodular opacity in right upper lobe on image 83/7 measuring 11 x 7 mm previously 7 mm. -Stable solid 6 mm left lower lobe pulmonary nodule on image 120/7. -stable 5 mm left lower lobe pulmonary nodule on image 127/7 -stable 3 mm left lower lobe pulmonary nodule on image 110/7 Upper Abdomen: Right kidney surgically absent. Musculoskeletal: No aggressive lytic or blastic lesion of bone. Stable lucent lesion in the posterior right eighth rib with narrow zone of transition on coronal image 129/5 present dating back to at least September 12/07/2019 and not hypermetabolic on PET-CT December 28, 2019 compatible with a benign finding. Multilevel degenerative changes spine. IMPRESSION: 1. Increased size of the peripheral irregular nodular opacity in the right upper lobe measuring 11 x 7 mm, consider further evaluation with nuclear medicine PET-CT. 2. Stable post treatment scarring in the perihilar left lung without new nodularity. 3. Stable 9 mm short axis lymph node in the right hilum. 4. Aortic Atherosclerosis (ICD10-I70.0) and Emphysema (ICD10-J43.9). Electronically Signed   By: Reyes Holder M.D.   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  ***  Plan:  ***   *** minutes of total time was spent for this patient encounter, including preparation, face-to-face counseling with the patient and coordination of care, physical exam, and documentation of the encounter. ____________________________________  Lynwood CHARM Nasuti, PhD, MD  This document serves as a record of services personally performed by Lynwood Nasuti, MD.  It was created on his behalf by Dorthy Fuse, a trained medical scribe. The creation of this record is based on the scribe's personal observations and the provider's statements to them. This document has been checked and approved by the attending provider.

## 2023-10-23 ENCOUNTER — Ambulatory Visit (HOSPITAL_COMMUNITY)
Admission: RE | Admit: 2023-10-23 | Discharge: 2023-10-23 | Disposition: A | Discharge status: Home / Self Care | Source: Ambulatory Visit | Attending: Radiology | Admitting: Radiology

## 2023-10-23 DIAGNOSIS — C3432 Malignant neoplasm of lower lobe, left bronchus or lung: Secondary | ICD-10-CM | POA: Insufficient documentation

## 2023-10-23 DIAGNOSIS — C3411 Malignant neoplasm of upper lobe, right bronchus or lung: Secondary | ICD-10-CM | POA: Diagnosis not present

## 2023-10-23 LAB — GLUCOSE, CAPILLARY: Glucose-Capillary: 115 mg/dL — ABNORMAL HIGH (ref 70–99)

## 2023-10-23 MED ORDER — FLUDEOXYGLUCOSE F - 18 (FDG) INJECTION
8.2860 | Freq: Once | INTRAVENOUS | Status: AC | PRN
  Administered 2023-10-23: 8.286 via INTRAVENOUS

## 2023-10-28 ENCOUNTER — Inpatient Hospital Stay
Admission: RE | Admit: 2023-10-28 | Discharge: 2023-10-28 | Disposition: A | Discharge status: Home / Self Care | Source: Ambulatory Visit | Attending: Urology

## 2023-10-28 DIAGNOSIS — R972 Elevated prostate specific antigen [PSA]: Secondary | ICD-10-CM

## 2023-10-28 DIAGNOSIS — N400 Enlarged prostate without lower urinary tract symptoms: Secondary | ICD-10-CM | POA: Diagnosis not present

## 2023-10-28 MED ORDER — GADOPICLENOL 0.5 MMOL/ML IV SOLN
8.0000 mL | Freq: Once | INTRAVENOUS | Status: AC | PRN
  Administered 2023-10-28 (×2): 8 mL via INTRAVENOUS

## 2023-11-04 ENCOUNTER — Other Ambulatory Visit: Payer: Self-pay | Admitting: Radiology

## 2023-11-04 DIAGNOSIS — C3432 Malignant neoplasm of lower lobe, left bronchus or lung: Secondary | ICD-10-CM

## 2023-11-04 NOTE — Progress Notes (Signed)
 I called the patient to review the results of his most recent PET scan. Imaging demonstrates the enlarging right upper lobe pulmonary nodule to be hypermetabolic; as well ss a 7 mm hypermetabolic nodule in the medial aspect of the right upper lobe, concerning for malignancy.  Dr. Shannon is recommending biopsy of these 2 areas.  Patient expressed understanding and is in agreement with the stated plan.  Referral placed today for consideration of biopsy today.    Leeroy Due, PA-C

## 2023-11-05 NOTE — Progress Notes (Unsigned)
 Dylan Sieving, MD  Dylan Lamar RAMAN, MD; Dylan Leeroy HERO, PA-C Cc: Daralene Ferol FALCON, RT; Baldwin Channing CROME The peripheral RUL nodule is small and percutaneous CT biopsy carries high risk of pneumothorax complication due to underlying emphysema.  The medial RUL lesion abuts posterior RUL segmental bronchus (CT 10/07/23  Se 7 Im  55) and  may be approachable via advanced bronchoscope technique. I have included Dr. Shelah to solicit his opinion on this approach.  Thanks Harvey Johann MD IR       Previous Messages    ----- Message ----- From: Baldwin Channing CROME Sent: 11/05/2023   8:50 AM EDT To: Channing CROME Baldwin; Taryn F Rigney, RT; Ir Proc*  Procedure :CT LUNG MASS BIOPSY  Reason :hypermetabolic left lung nodules, Dx: Primary cancer of left lower lobe of lung  History :MR PROSTATE W WO CONTRAST,NM PET Image Restage (PS) Skull Base to Thigh (F-18 FDG),CT CHEST WO CONTRAST,CT CHEST WO CONTRAST,CT CHEST WO CONTRAST  Provider: Wyatt Leeroy HERO, PA-C  Provider contact ; [phone]

## 2023-11-07 ENCOUNTER — Telehealth: Payer: Self-pay | Admitting: Emergency Medicine

## 2023-11-07 NOTE — Telephone Encounter (Signed)
 Can we work on getting this pt in with Arvine Clayburn, Groce, Dewald, Alghanim or Hattar?  Thank you very much.

## 2023-11-07 NOTE — Telephone Encounter (Signed)
-----   Message from Dayne Daniel Hassell sent at 11/05/2023 11:44 AM EDT ----- The peripheral RUL nodule is small and percutaneous CT biopsy carries high risk of pneumothorax complication due to underlying emphysema.  The medial RUL lesion abuts posterior RUL segmental bronchus (CT 10/07/23  Se 7 Im  55) and  may be approachable via advanced bronchoscope technique. I have included Dr. Shelah to solicit his opinion on this approach.  Thanks Toribio Faes MD IR ----- Message ----- From: Baldwin Channing CROME Sent: 11/05/2023   8:50 AM EDT To: Channing CROME Baldwin; Taryn F Rigney, RT; Ir Proc#  Procedure :CT LUNG MASS BIOPSY  Reason :hypermetabolic left lung nodules, Dx: Primary cancer of left lower lobe of lung  History :MR PROSTATE W WO CONTRAST,NM PET Image Restage (PS) Skull Base to Thigh (F-18 FDG),CT CHEST WO CONTRAST,CT CHEST WO CONTRAST,CT CHEST WO CONTRAST  Provider: Wyatt Leeroy HERO, PA-C  Provider contact ; [phone]

## 2023-11-07 NOTE — Progress Notes (Unsigned)
 Shannon Agent, MD  Shelah Lamar RAMAN, MD; Johann Sieving, MD; Wyatt Leeroy HERO, PA-C Cc: Daralene Ferol FALCON, RT; Baldwin Channing LITTIE Rolan, thanks for reviewing his CT scans.  Rob,  thanks for reviewing his scans and setting up an appointment in your office.    Signe BEAL       Previous Messages    ----- Message ----- From: Shelah Lamar RAMAN, MD Sent: 11/07/2023   5:27 PM EDT To: Sieving Johann, MD; Agent Shannon, MD; Leeroy *  Reviewed his CT chest. The RUL nodule that has enlarged is very peripheral, somewhat poorly formed but could be targeted.  The more medial nodule is small but does have an adjacent airway.  Would be reasonable to consider navigational bronchoscopy to try to biopsy 1 or both locations if we believe the patient wants to pursue. I will get him into our clinic to discuss. Thank you  Myer Shelah ----- Message ----- From: Johann Sieving, MD Sent: 11/05/2023  11:49 AM EDT To: Leeroy HERO Wyatt, PA-C; Lamar RAMAN Shelah, MD; Cher*  The peripheral RUL nodule is small and percutaneous CT biopsy carries high risk of pneumothorax complication due to underlying emphysema.  The medial RUL lesion abuts posterior RUL segmental bronchus (CT 10/07/23  Se 7 Im  55) and  may be approachable via advanced bronchoscope technique. I have included Dr. Shelah to solicit his opinion on this approach.  Thanks Sieving Johann MD IR ----- Message ----- From: Baldwin Channing LITTIE Sent: 11/05/2023   8:50 AM EDT To: Channing LITTIE Baldwin; Taryn F Rigney, RT; Ir Proc*  Procedure :CT LUNG MASS BIOPSY  Reason :hypermetabolic left lung nodules, Dx: Primary cancer of left lower lobe of lung  History :MR PROSTATE W WO CONTRAST,NM PET Image Restage (PS) Skull Base to Thigh (F-18 FDG),CT CHEST WO CONTRAST,CT CHEST WO CONTRAST,CT CHEST WO CONTRAST  Provider: Wyatt Leeroy HERO, PA-C  Provider contact ; [phone]

## 2023-11-07 NOTE — Progress Notes (Unsigned)
 Shelah Lamar RAMAN, MD  Johann Sieving, MD; Wyatt Leeroy CHRISTELLA NELS Shannon Lynwood, MD Cc: Daralene Ferol FALCON, RT; Baldwin Channing CROME Reviewed his CT chest. The RUL nodule that has enlarged is very peripheral, somewhat poorly formed but could be targeted.  The more medial nodule is small but does have an adjacent airway.  Would be reasonable to consider navigational bronchoscopy to try to biopsy 1 or both locations if we believe the patient wants to pursue. I will get him into our clinic to discuss. Thank you  Myer Shelah       Previous Messages    ----- Message ----- From: Johann Sieving, MD Sent: 11/05/2023  11:49 AM EDT To: Leeroy CHRISTELLA Wyatt, PA-C; Lamar RAMAN Shelah, MD; Cher*  The peripheral RUL nodule is small and percutaneous CT biopsy carries high risk of pneumothorax complication due to underlying emphysema.  The medial RUL lesion abuts posterior RUL segmental bronchus (CT 10/07/23  Se 7 Im  55) and  may be approachable via advanced bronchoscope technique. I have included Dr. Shelah to solicit his opinion on this approach.  Thanks Sieving Johann MD IR ----- Message ----- From: Baldwin Channing CROME Sent: 11/05/2023   8:50 AM EDT To: Channing CROME Baldwin; Taryn F Rigney, RT; Ir Proc*  Procedure :CT LUNG MASS BIOPSY  Reason :hypermetabolic left lung nodules, Dx: Primary cancer of left lower lobe of lung  History :MR PROSTATE W WO CONTRAST,NM PET Image Restage (PS) Skull Base to Thigh (F-18 FDG),CT CHEST WO CONTRAST,CT CHEST WO CONTRAST,CT CHEST WO CONTRAST  Provider: Wyatt Leeroy CHRISTELLA, PA-C  Provider contact ; [phone]

## 2023-11-08 NOTE — Progress Notes (Signed)
 Shannon Agent, MD  Shelah Lamar RAMAN, MD; Johann Sieving, MD; Wyatt Leeroy HERO, PA-C Cc: Daralene Ferol FALCON, RT; Baldwin Channing LITTIE Rolan, thanks for reviewing his CT scans.  Rob,  thanks for reviewing his scans and setting up an appointment in your office.    Signe BEAL       Previous Messages    ----- Message ----- From: Shelah Lamar RAMAN, MD Sent: 11/07/2023   5:27 PM EDT To: Sieving Johann, MD; Agent Shannon, MD; Leeroy *  Reviewed his CT chest. The RUL nodule that has enlarged is very peripheral, somewhat poorly formed but could be targeted.  The more medial nodule is small but does have an adjacent airway.  Would be reasonable to consider navigational bronchoscopy to try to biopsy 1 or both locations if we believe the patient wants to pursue. I will get him into our clinic to discuss. Thank you  Myer Shelah ----- Message ----- From: Johann Sieving, MD Sent: 11/05/2023  11:49 AM EDT To: Leeroy HERO Wyatt, PA-C; Lamar RAMAN Shelah, MD; Cher*  The peripheral RUL nodule is small and percutaneous CT biopsy carries high risk of pneumothorax complication due to underlying emphysema.  The medial RUL lesion abuts posterior RUL segmental bronchus (CT 10/07/23  Se 7 Im  55) and  may be approachable via advanced bronchoscope technique. I have included Dr. Shelah to solicit his opinion on this approach.  Thanks Sieving Johann MD IR ----- Message ----- From: Baldwin Channing LITTIE Sent: 11/05/2023   8:50 AM EDT To: Channing LITTIE Baldwin; Sheetal Lyall F Aahana Elza, RT; Ir Proc*  Procedure :CT LUNG MASS BIOPSY  Reason :hypermetabolic left lung nodules, Dx: Primary cancer of left lower lobe of lung  History :MR PROSTATE W WO CONTRAST,NM PET Image Restage (PS) Skull Base to Thigh (F-18 FDG),CT CHEST WO CONTRAST,CT CHEST WO CONTRAST,CT CHEST WO CONTRAST  Provider: Wyatt Leeroy HERO, PA-C  Provider contact ; [phone]

## 2023-11-08 NOTE — Telephone Encounter (Signed)
 Called and spoke with the patient. [facility] and stated he could not go to a quiet area to talk. Pt stated to call him around 5pm and I explained to the patient that our office closed at 2:30 today. Pt started getting upset because he couldn't hear, I advised pt I will contact him Monday to schedule an appointment.   I sent a secure chat to Dr. Catherine to see if I could schedule pt in 8/29 blocked spot. Dr. Catherine said it was OK to schedule pt in blocked spot at [facility].   I will try calling the patient back Monday.

## 2023-11-11 NOTE — Telephone Encounter (Signed)
 ATC went straight to vm- left vm to call back.   Routing to front staff to trying schedule the pt.   Can use Dr. Brett blocked spot on 8/29 at [facility].  If pt is unable to schedule in soonest spot listen above, can use RB or Sarah lung nodule spot if needed.

## 2023-11-11 NOTE — Telephone Encounter (Signed)
 Patient was unavailable for this Friday, but was scheduled for his preference date and time which was Monday September 22nd at 2:30 with Dr. Zaida. Nothing further needed.

## 2023-12-02 DIAGNOSIS — Z9089 Acquired absence of other organs: Secondary | ICD-10-CM | POA: Diagnosis not present

## 2023-12-02 DIAGNOSIS — H60391 Other infective otitis externa, right ear: Secondary | ICD-10-CM | POA: Diagnosis not present

## 2023-12-02 NOTE — Progress Notes (Signed)
 Procedure Note - Ear Microscopy, Bilateral:  Cc: 6-week return visit. History of right mastoidectomy, about 8 years ago. Using CASH powder in right ear 3x per week for chronic OE.   Risks/benefits and alternatives were discussed with patient who understands and agrees to proceed.   DETAILS OF PROCEDURE: The patient was positioned and the ear was examined with the microscope. Minimal moist exudate removed with 3-0 suction from right ear canal (surface of posterior TM). TM is intact and middle ear aerated.   Left EAC with small amount of cerumen along the anterior canal wall, removed using curette. TM normal and intact, middle ear aerated. No signs of cholesteatoma.   The patient tolerated this well.  No complications.   Follow up: 8 weeks. Continue CASH powder in right ear, 2 puffs, 3 times per week.

## 2023-12-03 DIAGNOSIS — R21 Rash and other nonspecific skin eruption: Secondary | ICD-10-CM | POA: Diagnosis not present

## 2023-12-03 DIAGNOSIS — B029 Zoster without complications: Secondary | ICD-10-CM | POA: Diagnosis not present

## 2023-12-03 DIAGNOSIS — Z905 Acquired absence of kidney: Secondary | ICD-10-CM | POA: Diagnosis not present

## 2023-12-09 ENCOUNTER — Telehealth: Payer: Self-pay

## 2023-12-09 ENCOUNTER — Ambulatory Visit (INDEPENDENT_AMBULATORY_CARE_PROVIDER_SITE_OTHER)

## 2023-12-09 VITALS — BP 148/70 | HR 84 | Temp 97.8°F | Ht 72.0 in | Wt 158.4 lb

## 2023-12-09 DIAGNOSIS — R911 Solitary pulmonary nodule: Secondary | ICD-10-CM | POA: Insufficient documentation

## 2023-12-09 DIAGNOSIS — F17210 Nicotine dependence, cigarettes, uncomplicated: Secondary | ICD-10-CM

## 2023-12-09 DIAGNOSIS — Z85118 Personal history of other malignant neoplasm of bronchus and lung: Secondary | ICD-10-CM

## 2023-12-09 DIAGNOSIS — R001 Bradycardia, unspecified: Secondary | ICD-10-CM | POA: Diagnosis not present

## 2023-12-09 DIAGNOSIS — I442 Atrioventricular block, complete: Secondary | ICD-10-CM | POA: Diagnosis not present

## 2023-12-09 NOTE — Progress Notes (Signed)
 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   Patient was referred to pulmonary due to findings on surveillance CT imaging as well as PET.  He is noted to have a CT chest performed in April 2025 which showed a new 7 mm right upper lobe nodular opacity.  Repeat CT scan in July showed an increase in the size of his right upper lobe nodule now measuring 11 x 7 mm.  PET/CT performed which showed PET avid enlarging right upper lobe nodule as well as a 7 mm hypermetabolic nodule in the medial aspect of the right upper lobe.  There was no evidence of recurrent disease on the left side.  No evidence of mediastinal or hilar lymph node involvement.   Patient is not on any blood thinners.  His symptoms include dyspnea on exertion.  He also has a chronic cough with clear mucus production.  He is still a smoker and smokes 1 pack/day.  He is trying to cut down.  He is retired but still works at [employer] center and would like to avoid missing work  Past Medical History:  Diagnosis Date   Allergy    Arthritis    thumbs   Cataract    Colonic adenoma 2009   ED (erectile dysfunction)    Emphysema lung (HCC) MILD    Gross hematuria    Hematospermia    History of colon polyps    History of radiation therapy 01/18/20-01/25/2020   Left Lung 3 fx; Dr. Lynwood Nasuti   History of radiation therapy 07/24/2018-07/31/2018   SBRT Left lung; Dr Lynwood Nasuti   Hyperlipemia    Hypertension    Left testicular pain    Lumbago    Neoplasm of kidney    Renal cancer [facility])    Renal cyst    Right testicular pain    Tobacco abuse      Family History  Problem Relation Age of Onset   Alzheimer's  disease Mother    Hypothyroidism Mother    Severe combined immunodeficiency Mother    Lung cancer Father    Prostate cancer Father    Colon cancer Neg Hx    Colon polyps Neg Hx    Esophageal cancer Neg Hx    Rectal cancer Neg Hx    Stomach cancer Neg Hx      Social History   Socioeconomic History   Marital status: Married    Spouse name: Not on file   Number of children: Not on file   Years of education: Not on file   Highest education level: Not on file  Occupational History   Not on file  Tobacco Use   Smoking status: Every Day    Current packs/day: 1.00    Average packs/day: 1 pack/day for 60.7 years (60.7 ttl pk-yrs)    Types: Cigarettes    Start date: 1965    Passive exposure: Current   Smokeless tobacco: Never   Tobacco comments:    Started 60 years when he was [age] yrs old  ago admits he knows its not good MMP cma  Substance and Sexual Activity   Alcohol  use: Yes    Alcohol /week: 2.0 standard drinks of alcohol   Types: 2 Shots of liquor per week    Comment: occasionally   Drug use: No   Sexual activity: Not on file  Other Topics Concern   Not on file  Social History Narrative   Married 1980 no chilldren. Went to college   Occupation is Preservationist @ [employer]   Social Drivers of Corporate investment banker Strain: Not on [employer] Insecurity: Low Risk  (07/15/2023)   Received from [facility]   Hunger Vital Sign    Within the past 12 months, you worried that your food would run out before you got money to buy more: Never true    Within the past 12 months, the food you bought just didn't last and you didn't have money to get more. : Never true  Transportation Needs: No Transportation Needs (07/15/2023)   Received from [facility]    In the past 12 months, has lack of reliable transportation kept you from medical appointments, meetings, work or from getting things needed for daily living? : No  Physical  Activity: Not on file  Stress: Not on file  Social Connections: Not on file  Intimate Partner Violence: Not on file     Allergies  Allergen Reactions   Neomycin-Bacitracin Zn-Polymyx Swelling and Rash     Outpatient Medications Prior to Visit  Medication Sig Dispense Refill   ALFALFA PO Take by mouth daily.     amLODipine (NORVASC) 5 MG tablet Take 5 mg by mouth daily.     Ascorbic Acid (VITAMIN C) 1000 MG tablet Take 1,000 mg by mouth daily.     b complex vitamins tablet Take 1 tablet by mouth daily.     cholecalciferol (VITAMIN D) 1000 units tablet Take 1,000 Units by mouth daily.     ezetimibe (ZETIA) 10 MG tablet Take 10 mg by mouth daily.     fexofenadine (ALLEGRA) 60 MG tablet Take 60 mg by mouth daily.     Flaxseed, Linseed, (FLAX SEED OIL PO) Take by mouth daily.     lisinopril  (ZESTRIL ) 10 MG tablet      Omega-3 Fatty Acids (FISH OIL) 1000 MG CAPS Take by mouth daily.     pregabalin (LYRICA) 50 MG capsule      PRESCRIPTION MEDICATION Place 1 capsule into the right ear every other day. Sulfacetamide amphoteracin b - chloramphenincol - hydrocortisone     PSYLLIUM FIBER PO Take by mouth daily.     sodium chloride  (OCEAN) 0.65 % SOLN nasal spray Place 1 spray into the nose daily.     valACYclovir (VALTREX) 1000 MG tablet 1 tablet Orally twice daily; Duration: 7 days     No facility-administered medications prior to visit.    ROS Reviewed all systems and reported negative except as above     Objective:   Vitals:   12/09/23 1427  BP: (!) 148/70  Pulse: 84  Temp: 97.8 F (36.6 C)  TempSrc: Temporal  SpO2: 98%  Weight: 158 lb 6.4 oz (71.8 kg)  Height: 6' (1.829 m)    Physical Exam General: Elderly male not in acute distress Chest: Clear to auscultation bilaterally Heart: Regular rate and rhythm, normal S1, normal S2 Extremities: Warm, well-perfused Neuro: Grossly intact    CBC    Component Value Date/Time   WBC 7.4 12/04/2021 1551   RBC 4.31 12/04/2021  1551   HGB 14.0 12/04/2021 1551   HCT 39.9 12/04/2021 1551   PLT 181 12/04/2021 1551  MCV 92.6 12/04/2021 1551   MCH 32.5 12/04/2021 1551   MCHC 35.1 12/04/2021 1551   RDW 13.2 12/04/2021 1551   LYMPHSABS 1.9 03/19/2012 0354   MONOABS 1.0 03/19/2012 0354   EOSABS 0.1 03/19/2012 0354   BASOSABS 0.0 03/19/2012 0354     Chest imaging:  PFT:    Latest Ref Rng & Units 05/26/2018    9:53 AM  PFT Results  FVC-Pre L 4.06   FVC-Predicted Pre % 85   FVC-Post L 4.23   FVC-Predicted Post % 88   Pre FEV1/FVC % % 58   Post FEV1/FCV % % 59   FEV1-Pre L 2.35   FEV1-Predicted Pre % 66   FEV1-Post L 2.49   DLCO uncorrected ml/min/mmHg 15.63   DLCO UNC% % 56   DLVA Predicted % 64   TLC L 7.38   TLC % Predicted % 100   RV % Predicted % 130   FEV1 FVC ratio reduced with evidence of obstruction.  FEV1 of 66% suggestive of moderate obstruction.  DLCO 56% suggestive of parenchymal lung damage      Assessment & Plan:   Assessment & Plan Lung nodule Patient with history of poorly differentiated carcinoma in the left lower lung.  Now has 2 new PET avid and his right upper lung.  Discussed bronchoscopy with robotic assisted biopsy.  Discussed benefits of the procedure including obtaining tissue.  Discussed risks of the procedure including pneumothorax, bleeding and pneumonia.  Patient is interested in pursuing biopsy.  Plan to obtain a repeat CT scan and proceed with robotic assisted navigational bronchoscopy.  Patient is in agreement  No orders of the defined types were placed in this encounter.     Zola Herter, MD [facility] [place] Office: [phone]

## 2023-12-09 NOTE — H&P (View-Only) (Signed)
 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   Patient was referred to pulmonary due to findings on surveillance CT imaging as well as PET.  He is noted to have a CT chest performed in April 2025 which showed a new 7 mm right upper lobe nodular opacity.  Repeat CT scan in July showed an increase in the size of his right upper lobe nodule now measuring 11 x 7 mm.  PET/CT performed which showed PET avid enlarging right upper lobe nodule as well as a 7 mm hypermetabolic nodule in the medial aspect of the right upper lobe.  There was no evidence of recurrent disease on the left side.  No evidence of mediastinal or hilar lymph node involvement.   Patient is not on any blood thinners.  His symptoms include dyspnea on exertion.  He also has a chronic cough with clear mucus production.  He is still a smoker and smokes 1 pack/day.  He is trying to cut down.  He is retired but still works at [employer] center and would like to avoid missing work  Past Medical History:  Diagnosis Date   Allergy    Arthritis    thumbs   Cataract    Colonic adenoma 2009   ED (erectile dysfunction)    Emphysema lung (HCC) MILD    Gross hematuria    Hematospermia    History of colon polyps    History of radiation therapy 01/18/20-01/25/2020   Left Lung 3 fx; Dr. Lynwood Nasuti   History of radiation therapy 07/24/2018-07/31/2018   SBRT Left lung; Dr Lynwood Nasuti   Hyperlipemia    Hypertension    Left testicular pain    Lumbago    Neoplasm of kidney    Renal cancer [facility])    Renal cyst    Right testicular pain    Tobacco abuse      Family History  Problem Relation Age of Onset   Alzheimer's  disease Mother    Hypothyroidism Mother    Severe combined immunodeficiency Mother    Lung cancer Father    Prostate cancer Father    Colon cancer Neg Hx    Colon polyps Neg Hx    Esophageal cancer Neg Hx    Rectal cancer Neg Hx    Stomach cancer Neg Hx      Social History   Socioeconomic History   Marital status: Married    Spouse name: Not on file   Number of children: Not on file   Years of education: Not on file   Highest education level: Not on file  Occupational History   Not on file  Tobacco Use   Smoking status: Every Day    Current packs/day: 1.00    Average packs/day: 1 pack/day for 60.7 years (60.7 ttl pk-yrs)    Types: Cigarettes    Start date: 1965    Passive exposure: Current   Smokeless tobacco: Never   Tobacco comments:    Started 60 years when he was [age] yrs old  ago admits he knows its not good MMP cma  Substance and Sexual Activity   Alcohol  use: Yes    Alcohol /week: 2.0 standard drinks of alcohol   Types: 2 Shots of liquor per week    Comment: occasionally   Drug use: No   Sexual activity: Not on file  Other Topics Concern   Not on file  Social History Narrative   Married 1980 no chilldren. Went to college   Occupation is Preservationist @ [employer]   Social Drivers of Corporate investment banker Strain: Not on [employer] Insecurity: Low Risk  (07/15/2023)   Received from [facility]   Hunger Vital Sign    Within the past 12 months, you worried that your food would run out before you got money to buy more: Never true    Within the past 12 months, the food you bought just didn't last and you didn't have money to get more. : Never true  Transportation Needs: No Transportation Needs (07/15/2023)   Received from [facility]    In the past 12 months, has lack of reliable transportation kept you from medical appointments, meetings, work or from getting things needed for daily living? : No  Physical  Activity: Not on file  Stress: Not on file  Social Connections: Not on file  Intimate Partner Violence: Not on file     Allergies  Allergen Reactions   Neomycin-Bacitracin Zn-Polymyx Swelling and Rash     Outpatient Medications Prior to Visit  Medication Sig Dispense Refill   ALFALFA PO Take by mouth daily.     amLODipine (NORVASC) 5 MG tablet Take 5 mg by mouth daily.     Ascorbic Acid (VITAMIN C) 1000 MG tablet Take 1,000 mg by mouth daily.     b complex vitamins tablet Take 1 tablet by mouth daily.     cholecalciferol (VITAMIN D) 1000 units tablet Take 1,000 Units by mouth daily.     ezetimibe (ZETIA) 10 MG tablet Take 10 mg by mouth daily.     fexofenadine (ALLEGRA) 60 MG tablet Take 60 mg by mouth daily.     Flaxseed, Linseed, (FLAX SEED OIL PO) Take by mouth daily.     lisinopril  (ZESTRIL ) 10 MG tablet      Omega-3 Fatty Acids (FISH OIL) 1000 MG CAPS Take by mouth daily.     pregabalin (LYRICA) 50 MG capsule      PRESCRIPTION MEDICATION Place 1 capsule into the right ear every other day. Sulfacetamide amphoteracin b - chloramphenincol - hydrocortisone     PSYLLIUM FIBER PO Take by mouth daily.     sodium chloride  (OCEAN) 0.65 % SOLN nasal spray Place 1 spray into the nose daily.     valACYclovir (VALTREX) 1000 MG tablet 1 tablet Orally twice daily; Duration: 7 days     No facility-administered medications prior to visit.    ROS Reviewed all systems and reported negative except as above     Objective:   Vitals:   12/09/23 1427  BP: (!) 148/70  Pulse: 84  Temp: 97.8 F (36.6 C)  TempSrc: Temporal  SpO2: 98%  Weight: 158 lb 6.4 oz (71.8 kg)  Height: 6' (1.829 m)    Physical Exam General: Elderly male not in acute distress Chest: Clear to auscultation bilaterally Heart: Regular rate and rhythm, normal S1, normal S2 Extremities: Warm, well-perfused Neuro: Grossly intact    CBC    Component Value Date/Time   WBC 7.4 12/04/2021 1551   RBC 4.31 12/04/2021  1551   HGB 14.0 12/04/2021 1551   HCT 39.9 12/04/2021 1551   PLT 181 12/04/2021 1551  MCV 92.6 12/04/2021 1551   MCH 32.5 12/04/2021 1551   MCHC 35.1 12/04/2021 1551   RDW 13.2 12/04/2021 1551   LYMPHSABS 1.9 03/19/2012 0354   MONOABS 1.0 03/19/2012 0354   EOSABS 0.1 03/19/2012 0354   BASOSABS 0.0 03/19/2012 0354     Chest imaging:  PFT:    Latest Ref Rng & Units 05/26/2018    9:53 AM  PFT Results  FVC-Pre L 4.06   FVC-Predicted Pre % 85   FVC-Post L 4.23   FVC-Predicted Post % 88   Pre FEV1/FVC % % 58   Post FEV1/FCV % % 59   FEV1-Pre L 2.35   FEV1-Predicted Pre % 66   FEV1-Post L 2.49   DLCO uncorrected ml/min/mmHg 15.63   DLCO UNC% % 56   DLVA Predicted % 64   TLC L 7.38   TLC % Predicted % 100   RV % Predicted % 130   FEV1 FVC ratio reduced with evidence of obstruction.  FEV1 of 66% suggestive of moderate obstruction.  DLCO 56% suggestive of parenchymal lung damage      Assessment & Plan:   Assessment & Plan Lung nodule Patient with history of poorly differentiated carcinoma in the left lower lung.  Now has 2 new PET avid and his right upper lung.  Discussed bronchoscopy with robotic assisted biopsy.  Discussed benefits of the procedure including obtaining tissue.  Discussed risks of the procedure including pneumothorax, bleeding and pneumonia.  Patient is interested in pursuing biopsy.  Plan to obtain a repeat CT scan and proceed with robotic assisted navigational bronchoscopy.  Patient is in agreement  No orders of the defined types were placed in this encounter.     Zola Herter, MD [facility] Office: [phone]

## 2023-12-09 NOTE — Patient Instructions (Signed)
 It was nice meeting you in the clinic today  We will plan for a bronchoscopy on the 29th of September.   We will follow up after the procedure

## 2023-12-09 NOTE — Telephone Encounter (Signed)
 Please schedule the following:  Provider performing procedure:Khoi Hamberger, Cy Carmel Assaker Diagnosis: Right lung nodule Which side if for nodule / mass? Right lung Procedure: Robotic Nav bronch + EBUS   Has patient been spoken to by Provider and given informed consent? Yes Anesthesia: General  Do you need Fluro? Yes Duration of procedure: 90 minutes Date: 12/16/23 Alternate Date: N/A  Time: AM Location: [facility] endo Does patient have OSA? No DM? No Or Latex allergy? No Medication Restriction/ Anticoagulate/Antiplatelet: None Pre-op Labs Ordered:determined by Anesthesia Imaging request: Yes, Super D Ct chest   (If, SuperDimension CT Chest, please have STAT courier sent to ENDO)

## 2023-12-09 NOTE — Assessment & Plan Note (Signed)
 Patient with history of poorly differentiated carcinoma in the left lower lung.  Now has 2 new PET avid and his right upper lung.  Discussed bronchoscopy with robotic assisted biopsy.  Discussed benefits of the procedure including obtaining tissue.  Discussed risks of the procedure including pneumothorax, bleeding and pneumonia.  Patient is interested in pursuing biopsy.  Plan to obtain a repeat CT scan and proceed with robotic assisted navigational bronchoscopy.  Patient is in agreement

## 2023-12-09 NOTE — Telephone Encounter (Signed)
 Routing to [employer] for [employer].

## 2023-12-13 ENCOUNTER — Encounter (HOSPITAL_COMMUNITY): Payer: Self-pay

## 2023-12-13 ENCOUNTER — Other Ambulatory Visit: Payer: Self-pay

## 2023-12-13 ENCOUNTER — Ambulatory Visit (HOSPITAL_COMMUNITY)
Admission: RE | Admit: 2023-12-13 | Discharge: 2023-12-13 | Disposition: A | Discharge status: Home / Self Care | Source: Ambulatory Visit

## 2023-12-13 DIAGNOSIS — J432 Centrilobular emphysema: Secondary | ICD-10-CM | POA: Diagnosis not present

## 2023-12-13 DIAGNOSIS — R911 Solitary pulmonary nodule: Secondary | ICD-10-CM | POA: Diagnosis not present

## 2023-12-13 DIAGNOSIS — R918 Other nonspecific abnormal finding of lung field: Secondary | ICD-10-CM | POA: Diagnosis not present

## 2023-12-13 DIAGNOSIS — I2510 Atherosclerotic heart disease of native coronary artery without angina pectoris: Secondary | ICD-10-CM | POA: Diagnosis not present

## 2023-12-13 DIAGNOSIS — I3481 Nonrheumatic mitral (valve) annulus calcification: Secondary | ICD-10-CM | POA: Diagnosis not present

## 2023-12-13 NOTE — Progress Notes (Signed)
 PCP - Oneil Neth, MD  Chest CT- 12/13/23 EKG - DOS   Anesthesia review: N  Patient verbally denies any shortness of breath, fever, cough and chest pain during phone call   -------------  SDW INSTRUCTIONS given:  Your procedure is scheduled on Monday, Sept 29th.  Report to [facility] Main Entrance A at 0530 A.M., and check in at the Admitting office.  Call this number if you have problems the morning of surgery:  [phone]   Remember:  Do not eat or drink after midnight the night before your surgery    Take these medicines the morning of surgery with A SIP OF WATER   N/A  As of today, STOP taking any Aspirin (unless otherwise instructed by your surgeon) Aleve, Naproxen, Ibuprofen, Motrin, Advil, Goody's, BC's, all herbal medications, fish oil, and all vitamins.                      Do not wear jewelry, make up, or nail polish            Do not wear lotions, powders, perfumes/colognes, or deodorant.            Do not shave 48 hours prior to surgery.  Men may shave face and neck.            Do not bring valuables to the hospital.            [facility] is not responsible for any belongings or valuables.  Do NOT Smoke (Tobacco/Vaping) 24 hours prior to your procedure If you use a CPAP at night, you may bring all equipment for your overnight stay.   Contacts, glasses, dentures or bridgework may not be worn into surgery.      For patients admitted to the hospital, discharge time will be determined by your treatment team.   Patients discharged the day of surgery will not be allowed to drive home, and someone needs to stay with them for 24 hours.    Special instructions:   [place]- Preparing For Surgery  Before surgery, you can play an important role. Because skin is not sterile, your skin needs to be as free of germs as possible. You can reduce the number of germs on your skin by washing with CHG (chlorahexidine gluconate) Soap before surgery.  CHG is an antiseptic  cleaner which kills germs and bonds with the skin to continue killing germs even after washing.    Oral Hygiene is also important to reduce your risk of infection.  Remember - BRUSH YOUR TEETH THE MORNING OF SURGERY WITH YOUR REGULAR TOOTHPASTE  Please do not use if you have an allergy to CHG or antibacterial soaps. If your skin becomes reddened/irritated stop using the CHG.  Do not shave (including legs and underarms) for at least 48 hours prior to first CHG shower. It is OK to shave your face.  Please follow these instructions carefully.   Shower the NIGHT BEFORE SURGERY and the MORNING OF SURGERY with DIAL Soap.   Pat yourself dry with a CLEAN TOWEL.  Wear CLEAN PAJAMAS to bed the night before surgery  Place CLEAN SHEETS on your bed the night of your first shower and DO NOT SLEEP WITH PETS.   Day of Surgery: Please shower morning of surgery  Wear Clean/Comfortable clothing the morning of surgery Do not apply any deodorants/lotions.   Remember to brush your teeth WITH YOUR REGULAR TOOTHPASTE.   Questions were answered. Patient verbalized understanding of instructions.

## 2023-12-15 NOTE — Anesthesia Preprocedure Evaluation (Signed)
 Anesthesia Evaluation    Reviewed: Allergy & Precautions, Patient's Chart, lab work & pertinent test results  History of Anesthesia Complications Negative for: history of anesthetic complications  Airway        Dental   Pulmonary COPD, Current Smoker and Patient abstained from smoking. Pulmonary nodules          Cardiovascular hypertension, Pt. on medications      Neuro/Psych negative neurological ROS     GI/Hepatic negative GI ROS, Neg liver ROS,,,  Endo/Other  negative endocrine ROS    Renal/GU negative Renal ROS     Musculoskeletal  (+) Arthritis ,    Abdominal   Peds  Hematology negative hematology ROS (+)   Anesthesia Other Findings   Reproductive/Obstetrics                              Anesthesia Physical Anesthesia Plan  ASA: 3  Anesthesia Plan: General   Post-op Pain Management: Minimal or no pain anticipated   Induction: Intravenous  PONV Risk Score and Plan: 2 and Treatment may vary due to age or medical condition, Ondansetron  and Dexamethasone   Airway Management Planned: Oral ETT  Additional Equipment: None  Intra-op Plan:   Post-operative Plan: Extubation in OR  Informed Consent:   Plan Discussed with:   Anesthesia Plan Comments: (EKG obtained in preop with new bradycardia in 30s, 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 Patient asymptomatic. Will defer procedure and send to ED for further cardiology workup. Lawence, MD)         Anesthesia Quick Evaluation

## 2023-12-16 ENCOUNTER — Ambulatory Visit (HOSPITAL_COMMUNITY)
Admission: RE | Disposition: A | Discharge status: Home / Self Care | Payer: Self-pay | Source: Home / Self Care | Attending: Emergency Medicine

## 2023-12-16 ENCOUNTER — Emergency Department (HOSPITAL_BASED_OUTPATIENT_CLINIC_OR_DEPARTMENT_OTHER)

## 2023-12-16 ENCOUNTER — Encounter (HOSPITAL_COMMUNITY): Payer: Self-pay

## 2023-12-16 ENCOUNTER — Other Ambulatory Visit: Payer: Self-pay

## 2023-12-16 ENCOUNTER — Observation Stay (HOSPITAL_COMMUNITY)
Admission: RE | Admit: 2023-12-16 | Discharge: 2023-12-17 | Disposition: A | Discharge status: Home / Self Care | Attending: Cardiology | Admitting: Cardiology

## 2023-12-16 ENCOUNTER — Emergency Department (HOSPITAL_COMMUNITY)

## 2023-12-16 ENCOUNTER — Encounter (HOSPITAL_COMMUNITY)
Admission: RE | Disposition: A | Discharge status: Home / Self Care | Payer: Self-pay | Source: Home / Self Care | Attending: Emergency Medicine

## 2023-12-16 ENCOUNTER — Encounter (HOSPITAL_COMMUNITY): Payer: Self-pay | Admitting: Anesthesiology

## 2023-12-16 ENCOUNTER — Emergency Department (HOSPITAL_COMMUNITY): Payer: Self-pay | Admitting: Anesthesiology

## 2023-12-16 DIAGNOSIS — I10 Essential (primary) hypertension: Secondary | ICD-10-CM | POA: Insufficient documentation

## 2023-12-16 DIAGNOSIS — F17210 Nicotine dependence, cigarettes, uncomplicated: Secondary | ICD-10-CM | POA: Insufficient documentation

## 2023-12-16 DIAGNOSIS — R911 Solitary pulmonary nodule: Secondary | ICD-10-CM | POA: Insufficient documentation

## 2023-12-16 DIAGNOSIS — I442 Atrioventricular block, complete: Principal | ICD-10-CM | POA: Insufficient documentation

## 2023-12-16 DIAGNOSIS — R9431 Abnormal electrocardiogram [ECG] [EKG]: Secondary | ICD-10-CM

## 2023-12-16 DIAGNOSIS — J449 Chronic obstructive pulmonary disease, unspecified: Secondary | ICD-10-CM | POA: Diagnosis not present

## 2023-12-16 DIAGNOSIS — Z79899 Other long term (current) drug therapy: Secondary | ICD-10-CM | POA: Diagnosis not present

## 2023-12-16 DIAGNOSIS — I4510 Unspecified right bundle-branch block: Secondary | ICD-10-CM | POA: Insufficient documentation

## 2023-12-16 DIAGNOSIS — Z85528 Personal history of other malignant neoplasm of kidney: Secondary | ICD-10-CM | POA: Insufficient documentation

## 2023-12-16 DIAGNOSIS — R0602 Shortness of breath: Secondary | ICD-10-CM | POA: Diagnosis not present

## 2023-12-16 DIAGNOSIS — I459 Conduction disorder, unspecified: Secondary | ICD-10-CM | POA: Diagnosis not present

## 2023-12-16 HISTORY — DX: Pneumonia, unspecified organism: J18.9

## 2023-12-16 HISTORY — PX: PACEMAKER IMPLANT: EP1218

## 2023-12-16 LAB — BASIC METABOLIC PANEL WITH GFR
Anion gap: 7 (ref 5–15)
BUN: 20 mg/dL (ref 8–23)
CO2: 23 mmol/L (ref 22–32)
Calcium: 8.8 mg/dL — ABNORMAL LOW (ref 8.9–10.3)
Chloride: 102 mmol/L (ref 98–111)
Creatinine, Ser: 1.43 mg/dL — ABNORMAL HIGH (ref 0.61–1.24)
GFR, Estimated: 51 mL/min — ABNORMAL LOW (ref >60)
Glucose, Bld: 99 mg/dL (ref 70–99)
Potassium: 3.8 mmol/L (ref 3.5–5.1)
Sodium: 132 mmol/L — ABNORMAL LOW (ref 135–145)

## 2023-12-16 LAB — CBC
HCT: 39 % (ref 39.0–52.0)
Hemoglobin: 13.5 g/dL (ref 13.0–17.0)
MCH: 32.5 pg (ref 26.0–34.0)
MCHC: 34.6 g/dL (ref 30.0–36.0)
MCV: 93.8 fL (ref 80.0–100.0)
Platelets: 178 K/uL (ref 150–400)
RBC: 4.16 MIL/uL — ABNORMAL LOW (ref 4.22–5.81)
RDW: 14.1 % (ref 11.5–15.5)
WBC: 7.4 K/uL (ref 4.0–10.5)
nRBC: 0 % (ref 0.0–0.2)

## 2023-12-16 LAB — TROPONIN I (HIGH SENSITIVITY)
Troponin I (High Sensitivity): 12 ng/L (ref <18)
Troponin I (High Sensitivity): 11 ng/L (ref <18)

## 2023-12-16 LAB — MAGNESIUM: Magnesium: 2.1 mg/dL (ref 1.7–2.4)

## 2023-12-16 LAB — ECHOCARDIOGRAM COMPLETE
Area-P 1/2: 5.13 cm2
Height: 72 in
S' Lateral: 2.6 cm
Weight: 2480 [oz_av]

## 2023-12-16 LAB — SURGICAL PCR SCREEN
MRSA, PCR: NEGATIVE
Staphylococcus aureus: NEGATIVE

## 2023-12-16 SURGERY — CANCELLED PROCEDURE

## 2023-12-16 MED ORDER — MIDAZOLAM HCL 2 MG/2ML IJ SOLN
INTRAMUSCULAR | Status: AC
  Filled 2023-12-16: qty 2

## 2023-12-16 MED ORDER — SODIUM CHLORIDE 0.9% FLUSH: 3.0000 mL | INTRAVENOUS | Status: DC | PRN

## 2023-12-16 MED ORDER — HEPARIN (PORCINE) IN NACL 1000-0.9 UT/500ML-% IV SOLN
INTRAVENOUS | Status: DC | PRN
  Administered 2023-12-16: 500 mL

## 2023-12-16 MED ORDER — VANCOMYCIN HCL 1000 MG IV SOLR
INTRAVENOUS | Status: DC
  Filled 2023-12-16: qty 20

## 2023-12-16 MED ORDER — CHLORHEXIDINE GLUCONATE 0.12 % MT SOLN: 15.0000 mL | Freq: Once | OROMUCOSAL | Status: AC

## 2023-12-16 MED ORDER — AMLODIPINE BESYLATE 5 MG PO TABS: 5.0000 mg | ORAL_TABLET | Freq: Once | ORAL | Status: AC

## 2023-12-16 MED ORDER — MIDAZOLAM HCL 5 MG/5ML IJ SOLN
INTRAMUSCULAR | Status: DC | PRN
  Administered 2023-12-16: .5 mg via INTRAVENOUS
  Administered 2023-12-16: 1 mg via INTRAVENOUS

## 2023-12-16 MED ORDER — ONDANSETRON HCL 4 MG/2ML IJ SOLN: 4.0000 mg | Freq: Four times a day (QID) | INTRAMUSCULAR | Status: DC | PRN

## 2023-12-16 MED ORDER — LACTATED RINGERS IV SOLN: INTRAVENOUS | Status: DC

## 2023-12-16 MED ORDER — SODIUM CHLORIDE 0.9% FLUSH
3.0000 mL | Freq: Two times a day (BID) | INTRAVENOUS | Status: DC
  Administered 2023-12-16 (×2): 3 mL via INTRAVENOUS

## 2023-12-16 MED ORDER — LIDOCAINE HCL 1 % IJ SOLN
INTRAMUSCULAR | Status: AC
Start: 2023-12-16 — End: 2023-12-16
  Filled 2023-12-16: qty 60

## 2023-12-16 MED ORDER — FENTANYL CITRATE (PF) 100 MCG/2ML IJ SOLN
INTRAMUSCULAR | Status: AC
  Filled 2023-12-16: qty 2

## 2023-12-16 MED ORDER — CHLORHEXIDINE GLUCONATE 0.12 % MT SOLN
OROMUCOSAL | Status: AC
  Administered 2023-12-16: 15 mL via OROMUCOSAL
  Filled 2023-12-16: qty 15

## 2023-12-16 MED ORDER — ACETAMINOPHEN 325 MG PO TABS: 650.0000 mg | ORAL_TABLET | ORAL | Status: DC | PRN

## 2023-12-16 MED ORDER — SODIUM CHLORIDE 0.9 % IV SOLN
80.0000 mg | INTRAVENOUS | Status: DC
  Administered 2023-12-16: 80 mg
  Filled 2023-12-16: qty 2

## 2023-12-16 MED ORDER — SODIUM CHLORIDE 0.9 % IV SOLN: INTRAVENOUS | Status: DC

## 2023-12-16 MED ORDER — LIDOCAINE HCL (PF) 1 % IJ SOLN
INTRAMUSCULAR | Status: DC | PRN
  Administered 2023-12-16: 30 mL

## 2023-12-16 MED ORDER — AMLODIPINE BESYLATE 5 MG PO TABS
5.0000 mg | ORAL_TABLET | Freq: Every day | ORAL | Status: DC
  Administered 2023-12-16 – 2023-12-17 (×2): 5 mg via ORAL
  Filled 2023-12-16 (×2): qty 1

## 2023-12-16 MED ORDER — FENTANYL CITRATE (PF) 100 MCG/2ML IJ SOLN
INTRAMUSCULAR | Status: DC | PRN
  Administered 2023-12-16: 25 ug via INTRAVENOUS
  Administered 2023-12-16: 50 ug via INTRAVENOUS
  Administered 2023-12-16: 25 ug via INTRAVENOUS

## 2023-12-16 MED ORDER — CEFAZOLIN SODIUM-DEXTROSE 2-4 GM/100ML-% IV SOLN
2.0000 g | INTRAVENOUS | Status: AC
  Filled 2023-12-16: qty 100

## 2023-12-16 MED ORDER — CEFAZOLIN SODIUM-DEXTROSE 2-4 GM/100ML-% IV SOLN
INTRAVENOUS | Status: AC
  Administered 2023-12-16: 2 g via INTRAVENOUS
  Filled 2023-12-16: qty 100

## 2023-12-16 MED ORDER — AMLODIPINE BESYLATE 5 MG PO TABS
ORAL_TABLET | ORAL | Status: AC
  Administered 2023-12-16: 5 mg via ORAL
  Filled 2023-12-16: qty 1

## 2023-12-16 MED ORDER — SODIUM CHLORIDE 0.9 % IV SOLN
INTRAVENOUS | Status: AC
  Filled 2023-12-16: qty 2

## 2023-12-16 NOTE — H&P (Addendum)
 Cardiology Consultation   Patient ID: Dylan Harvey MRN: [id number]; DOB: May 24, 1950  Admit date: 12/16/2023 Date of Consult: 12/16/2023  PCP:  Shayne Anes, MD   [facility] Providers Cardiologist: Dr. Hobart [phone]) (inactive)   Patient Profile: Dylan Harvey is a 73 y.o. male with a hx of  COPD, asthma Lung Ca ( left lower lobe status post SBRT completed in November 2021 ) HTN, HLD Coronary Ca RBBB  who is being seen 12/16/2023 for the evaluation of CHB at the request of Dr. Pamella.  History of Present Illness: Dylan Harvey was last seen by cardiology service Dec 2023, follonmg for coronary CA vis CT, without symptoms still smoking but working on it, declined statin tx, feeling well  No further cardiac contacts since then  Following with pulmonary w.hx of left lower lobe status post SBRT completed in November 2021 recently found with new 7 mm right upper lobe nodular opacity. Repeat CT scan in July showed an increase in the size of Dylan right upper lobe nodule now measuring 11 x 7 mm. PET/CT performed which showed PET avid enlarging right upper lobe nodule as well as a 7 mm hypermetabolic nodule in the medial aspect of the right upper lobe. There was no evidence of recurrent disease on the left side.  Planned to proceed with robotic assisted navigational bronchoscopy.  He arrived today for Dylan procedure, though observed on monitoring to be in CHB and referred to the ER, Dylan bronch/bx cancelled.  K+ 3.8 Mag 2.1 BUN/Creat 20/1.43 HS Trop 12 WBC 7.3 H/H 13/39 Plts 178  Home meds reviewed No nodal blocking agents noted  No reports of syncope or near syncope. Dylan Harvey has noticed Dylan energy seems to wax/wane. No CP No SOB    Past Medical History:  Diagnosis Date   Allergy    Arthritis    thumbs   Cataract    Colonic adenoma 2009   ED (erectile dysfunction)    Emphysema lung (HCC) MILD    Gross hematuria    Hematospermia    History of colon  polyps    History of radiation therapy 01/18/20-01/25/2020   Left Lung 3 fx; Dr. Lynwood Nasuti   History of radiation therapy 07/24/2018-07/31/2018   SBRT Left lung; Dr Lynwood Nasuti   Hyperlipemia    Hypertension    Left testicular pain    Lumbago    Neoplasm of kidney    Pneumonia    had it as a child   Renal cancer (HCC)    Renal cyst    Right testicular pain    Tobacco abuse     Past Surgical History:  Procedure Laterality Date   COLONOSCOPY     FUDUCIAL PLACEMENT N/A 06/11/2018   Procedure: PLACEMENT OF FUDUCIAL;  Surgeon: Kerrin Elspeth BROCKS, MD;  Location: [facility] OR;  Service: Thoracic;  Laterality: N/A;   HERNIA REPAIR     inguinal   INNER EAR SURGERY     POLYPECTOMY     ROBOT ASSITED LAPAROSCOPIC NEPHROURETERECTOMY Right 09/07/2020   Procedure: XI ROBOT ASSITED LAPAROSCOPIC NEPHROURETERECTOMY/ CYSTOSCOPY/ STENT PLACEMENT/ TRANSURETHRAL RESECTION OF URETERAL ORIFICE;  Surgeon: Devere Lonni Righter, MD;  Location: [facility] ORS;  Service: Urology;  Laterality: Right;   VIDEO BRONCHOSCOPY WITH ENDOBRONCHIAL NAVIGATION N/A 06/11/2018   Procedure: VIDEO BRONCHOSCOPY WITH ENDOBRONCHIAL NAVIGATION;  Surgeon: Kerrin Elspeth BROCKS, MD;  Location: [facility] OR;  Service: Thoracic;  Laterality: N/A;   WISDOM TOOTH EXTRACTION       Home Medications:  Prior  to Admission medications   Medication Sig Start Date End Date Taking? Authorizing Provider  ALFALFA PO Take by mouth daily.   Yes [provider]  amLODipine (NORVASC) 5 MG tablet Take 5 mg by mouth daily.   Yes [provider]  Ascorbic Acid (VITAMIN C) 1000 MG tablet Take 1,000 mg by mouth daily.   Yes [provider]  b complex vitamins tablet Take 1 tablet by mouth daily.   Yes [provider]  cholecalciferol (VITAMIN D) 1000 units tablet Take 1,000 Units by mouth daily.   Yes [provider]  ezetimibe (ZETIA) 10 MG tablet Take 10 mg by mouth daily.   Yes [provider]   fexofenadine (ALLEGRA) 60 MG tablet Take 60 mg by mouth daily.   Yes [provider]  Flaxseed, Linseed, (FLAX SEED OIL PO) Take by mouth daily.   Yes [provider]  PRESCRIPTION MEDICATION Place 1 capsule into the right ear every other day. Sulfacetamide amphoteracin b - chloramphenincol - hydrocortisone   Yes [provider]  PSYLLIUM FIBER PO Take by mouth daily.   Yes [provider]  sodium chloride  (OCEAN) 0.65 % SOLN nasal spray Place 1 spray into the nose daily.   Yes [provider]  valACYclovir (VALTREX) 1000 MG tablet 1 tablet Orally twice daily; Duration: 7 days 12/03/23  Yes [provider]  lisinopril  (ZESTRIL ) 10 MG tablet  05/14/19   [provider]  Omega-3 Fatty Acids (FISH OIL) 1000 MG CAPS Take by mouth daily.    [provider]  pregabalin (LYRICA) 50 MG capsule  07/23/18   [provider]    Scheduled Meds:  Continuous Infusions:  PRN Meds:   Allergies:    Allergies  Allergen Reactions   Neomycin-Bacitracin Zn-Polymyx Swelling and Rash    Social History:   Social History   Socioeconomic History   Marital status: Married    Spouse name: Not on file   Number of children: Not on file   Years of education: Not on file   Highest education level: Not on file  Occupational History   Not on file  Tobacco Use   Smoking status: Every Day    Current packs/day: 1.00    Average packs/day: 1 pack/day for 60.7 years (60.7 ttl pk-yrs)    Types: Cigarettes    Start date: 1965    Passive exposure: Current   Smokeless tobacco: Never   Tobacco comments:    Started 59 years when he was [age] yrs old  ago admits he knows its not good MMP cma  Substance and Sexual Activity   Alcohol  use: Yes    Alcohol /week: 2.0 standard drinks of alcohol     Types: 2 Shots of liquor per week    Comment: occasionally   Drug use: No   Sexual activity: Not on file  Other Topics Concern   Not on file   Social History Narrative   Married 1980 no chilldren. Went to college   Occupation is Preservationist @ [employer]   Social Drivers of Corporate investment banker Strain: Not on [employer] Insecurity: Low Risk  (07/15/2023)   Received from [facility]   Hunger Vital Sign    Within the past 12 months, you worried that your food would run out before you got money to buy more: Never true    Within the past 12 months, the food you bought just didn't last and you didn't have money  to get more. : Never true  Transportation Needs: No Transportation Needs (07/15/2023)   Received from [facility]   Transportation    In the past 12 months, has lack of reliable transportation kept you from medical appointments, meetings, work or from getting things needed for daily living? : No  Physical Activity: Not on file  Stress: Not on file  Social Connections: Not on file  Intimate Partner Violence: Not on file    Family History:   Family History  Problem Relation Age of Onset   Alzheimer's disease Mother    Hypothyroidism Mother    Severe combined immunodeficiency Mother    Lung cancer Father    Prostate cancer Father    Colon cancer Neg Hx    Colon polyps Neg Hx    Esophageal cancer Neg Hx    Rectal cancer Neg Hx    Stomach cancer Neg Hx      ROS:  Please see the history of present illness.  All other ROS reviewed and negative.     Physical Exam/Data: Vitals:   12/16/23 0815 12/16/23 0830 12/16/23 0845 12/16/23 0900  BP: (!) 169/65 (!) 171/61 (!) 175/59   Pulse: (!) 34 (!) 34 (!) 32 (!) 34  Resp: 16 17 18 17   Temp:      TempSrc:      SpO2: 98% 99% 98% 100%  Weight:      Height:       No intake or output data in the 24 hours ending 12/16/23 0911    12/16/2023    6:10 AM 12/09/2023    2:27 PM 10/14/2023    4:01 PM  Last 3 Weights  Weight (lbs) 155 lb 158 lb 6.4 oz 158 lb 3.2 oz  Weight (kg) 70.308 kg 71.85 kg 71.759 kg     Body mass index is 21.02  kg/m.  General:  Well nourished, well developed, in no acute distress HEENT: normal Neck: no JVD Vascular: No carotid bruits; Distal pulses 2+ bilaterally Cardiac:  RRR; no murmurs, gallops or rubs Lungs:  CTA b/l, no wheezing, rhonchi or rales  Abd: soft, nontender, no hepatomegaly  Ext: no edema Musculoskeletal:  No deformities Skin: warm and dry  Neuro:  no gross focal abnormalities noted Psych:  Normal affect   EKG:  The EKG was personally reviewed and demonstrates:    #1. Baseline motion c/w CHB 36bpm, RBBB #2. Is 2:1 AVblock 36bpm, RBBB  Old 02/22/22: is 2:1 AVblock 43bpm, RBBB  Telemetry:  Telemetry was personally reviewed and demonstrates:   2:1 AVblock 30's  Relevant CV Studies:  ORDERED  Laboratory Data: High Sensitivity Troponin:   Recent Labs  Lab 12/16/23 0804  TROPONINIHS 12     Chemistry Recent Labs  Lab 12/16/23 0606 12/16/23 0804  NA 132*  --   K 3.8  --   CL 102  --   CO2 23  --   GLUCOSE 99  --   BUN 20  --   CREATININE 1.43*  --   CALCIUM 8.8*  --   MG  --  2.1  GFRNONAA 51*  --   ANIONGAP 7  --     No results for input(s): PROT, ALBUMIN, AST, ALT, ALKPHOS, BILITOT in the last 168 hours. Lipids No results for input(s): CHOL, TRIG, HDL, LABVLDL, LDLCALC, CHOLHDL in the last 168 hours.  Hematology Recent Labs  Lab 12/16/23 0606  WBC 7.4  RBC 4.16*  HGB 13.5  HCT 39.0  MCV 93.8  MCH 32.5  MCHC 34.6  RDW 14.1  PLT 178   Thyroid  No results for input(s): TSH, FREET4 in the last 168 hours.  BNPNo results for input(s): BNP, PROBNP in the last 168 hours.  DDimer No results for input(s): DDIMER in the last 168 hours.  Radiology/Studies:  DG Chest Portable 1 View Result Date: 12/16/2023 CLINICAL DATA:  Shortness of breath.  Heart block. EXAM: PORTABLE CHEST 1 VIEW COMPARISON:  06/26/2021 FINDINGS: The cardiopericardial silhouette is within normal limits for size. Mild hyperexpansion. Similar  appearance left parahilar scarring with fiducial markers evident. No pulmonary edema, focal airspace consolidation, or pleural effusion. Multiple pulmonary nodule seen on chest CT 12/13/2023 not readily evident by x-ray. Telemetry leads overlie the chest. IMPRESSION: 1. No acute cardiopulmonary findings. 2. Similar appearance of left parahilar scarring with fiducial markers evident. Electronically Signed   By: Camellia Candle M.D.   On: 12/16/2023 08:19   CT Super D Chest Wo Contrast Result Date: 12/16/2023 CLINICAL DATA:  Pulmonary nodule. EXAM: CT CHEST WITHOUT CONTRAST TECHNIQUE: Multidetector CT imaging of the chest was performed using thin slice collimation for electromagnetic bronchoscopy planning purposes, without intravenous contrast. RADIATION DOSE REDUCTION: This exam was performed according to the departmental dose-optimization program which includes automated exposure control, adjustment of the mA and/or kV according to patient size and/or use of iterative reconstruction technique. COMPARISON:  PET-CT 10/23/2023.  Chest CT 10/07/2023. FINDINGS: Cardiovascular: The heart size is normal. No substantial pericardial effusion. Coronary artery calcification is evident. Mitral annular calcification evident. Mild atherosclerotic calcification is noted in the wall of the thoracic aorta. Mediastinum/Nodes: No mediastinal lymphadenopathy. No evidence for gross hilar lymphadenopathy although assessment is limited by the lack of intravenous contrast on the current study. The esophagus has normal imaging features. There is no axillary lymphadenopathy. Lungs/Pleura: Centrilobular and paraseptal emphysema evident. 10 mm central right upper lobe pulmonary nodule on 61/7 was 7 mm on previous PET-CT and noted to be hypermetabolic. Anterior right upper lobe pulmonary lesion measuring 16 mm today was 12 mm on previous PET-CT and noted to be hypermetabolic. Post treatment scarring noted parahilar left lung. 3 mm left lower  lobe nodule on 118/7 is stable since chest CT of 10/07/2023. Perifissural nodule in the inferior left lung on 130/7 is stable since prior chest CT. 5 mm left lower lobe nodule on 135/7 is stable. No new suspicious pulmonary nodule or mass. No focal airspace consolidation. No pleural effusion. Upper Abdomen: Visualized portion of the upper abdomen shows no acute findings. Sequelae of prior right nephrectomy. Musculoskeletal: No worrisome lytic or sclerotic osseous abnormality. IMPRESSION: 1. Interval increase in size of the 2 right upper lobe pulmonary nodules, noted to be hypermetabolic on previous PET-CT. Imaging features are compatible with neoplasm. 2. Stable appearance of additional tiny left lung nodules. 3. Aortic Atherosclerosis (ICD10-I70.0) and Emphysema (ICD10-J43.9). Electronically Signed   By: Camellia Candle M.D.   On: 12/16/2023 08:16     Assessment and Plan:  CHB RBBB Long hx of advanced conduction system disease No syncope/overt symptoms No reversible causes Will need PPM   Will admit Echo has been ordered/I have d/w the echo department to be done ASAP pending PPM Plan for this afternoon  For questions or updates, please contact [facility] Please consult [email or web address] for contact info under    Signed, Charlies Macario Arthur, PA-C  12/16/2023 9:11 AM  I have seen, examined the patient, and reviewed the above assessment and plan.    HPI: Dylan Harvey  is a 73 y.o. male with a history of smoking, COPD, left lower lobe lung cancer s/p SBRT completed in November 2021, now with right upper lobe lung cancer, HTN, HLD and RCC status post nephrectomy who presented to ED from planned bronchoscopy today after ECG was done which demonstrated complete heart block. He reports feeling at baseline. Although does endorse worsening fatigue over the past several months. No syncope. No new or acute complaints today.  General: Well developed, in no acute distress.  Neck: No JVD.   Cardiac: Bradycardic, regular rhythm.  Resp: Normal work of breathing.  Ext: No edema.  Neuro: No gross focal deficits.  Psych: Normal affect.   Assessment: Dylan Harvey is a 73 y.o. male with a history of smoking, COPD, left lower lobe lung cancer s/p SBRT completed in November 2021, now with right upper lobe lung cancer, HTN, HLD, RCC status post nephrectomy and baseline conduction disease who presents today with intermittent complete heart block.  Problem List:  #Complete heart block - Likely progression of underlying conduction disease. No reversible causes identified. Unlikely that he will be able to complete any of Dylan treatment/workup for lung cancer without pacer. #Symptomatic bradycardia - I suspect CHB and bradycardia are contributing to Dylan fatigue.   Plan:  -Patient meets criteria for permanent pacemaker implant in setting of complete heart block. Tentatively plan for implant this afternoon. -Echocardiogram today.  -Explained risks, benefits, and alternatives to pacemaker implantation, including but not limited to bleeding, infection, damage to heart or lungs, heart attack, stroke, or death.  Pt verbalized understanding and agrees to proceed if indicated.    [name], MD 12/16/2023 12:28 PM

## 2023-12-16 NOTE — ED Triage Notes (Signed)
 Patient was in short stay here for a biopsy and he was sent down to the ED due to short stay finding him in complete heart block.

## 2023-12-16 NOTE — Progress Notes (Signed)
 Patient noted to be in complete heart block. Asymptomatic. Will cancel bronchoscopy and send patient to the ER for further assessment. I discussed this with the patient and we agreed that he we would need to postpone his bronchoscopy to once his complete heart block is managed.   Zola Herter, MD [facility] Office: [phone]   See Amion for personal pager PCCM on call pager [phone] until 7pm. Please call Elink 7p-7a. [phone]

## 2023-12-16 NOTE — Interval H&P Note (Signed)
 History and Physical Interval Note:  12/16/2023 2:10 PM  Dylan Harvey  has presented today for surgery, with the diagnosis of complete heart block.  The various methods of treatment have been discussed with the patient and family. After consideration of risks, benefits and other options for treatment, the patient has consented to  Procedure(s): PACEMAKER IMPLANT (N/A) as a surgical intervention.  The patient's history has been reviewed, patient examined, no change in status, stable for surgery.  I have reviewed the patient's chart and labs.  Questions were answered to the patient's satisfaction.     Dylan Harvey

## 2023-12-16 NOTE — Progress Notes (Signed)
 Late Entry: At [id number] Dr. Paul notified of abnormal EKG and HR 30s.  Per Dr. Paul, procedure needs to be canceled and patient needs to be evaluated by cardiology.  Dr. Zaida and Endo made aware.  Dr. Paul and Dr. Zaida to speak with patient.  Patient transported to ED in wheelchair with wife and all belongings by short stay RN.

## 2023-12-16 NOTE — ED Provider Notes (Signed)
 [place] EMERGENCY DEPARTMENT AT [facility] Provider Note   CSN: [id number] Arrival date & time: 12/16/23  9262     Patient presents with: complete Heart Block   Dylan Harvey is a 73 y.o. male.  With a history of hypertension, hyperlipidemia and lung cancer presents to the ED for bradycardia.  Patient was scheduled to undergo lung biopsy this morning and was noted to be in third-degree heart block and sent here for further evaluation.  He is asymptomatic at this time.  No chest pain or shortness of breath.  Wife does note he has been more fatigued recently.  No fevers chills recent illness.   HPI     Prior to Admission medications   Medication Sig Start Date End Date Taking? Authorizing Provider  ALFALFA PO Take by mouth daily.   Yes [provider]  amLODipine (NORVASC) 5 MG tablet Take 5 mg by mouth daily.   Yes [provider]  Ascorbic Acid (VITAMIN C) 1000 MG tablet Take 1,000 mg by mouth daily.   Yes [provider]  b complex vitamins tablet Take 1 tablet by mouth daily.   Yes [provider]  cholecalciferol (VITAMIN D) 1000 units tablet Take 1,000 Units by mouth daily.   Yes [provider]  ezetimibe (ZETIA) 10 MG tablet Take 10 mg by mouth daily.   Yes [provider]  fexofenadine (ALLEGRA) 60 MG tablet Take 60 mg by mouth daily.   Yes [provider]  Flaxseed, Linseed, (FLAX SEED OIL PO) Take by mouth daily.   Yes [provider]  PRESCRIPTION MEDICATION Place 1 capsule into the right ear every other day. Sulfacetamide amphoteracin b - chloramphenincol - hydrocortisone   Yes [provider]  PSYLLIUM FIBER PO Take by mouth daily.   Yes [provider]  sodium chloride  (OCEAN) 0.65 % SOLN nasal spray Place 1 spray into the nose daily.   Yes [provider]  valACYclovir (VALTREX) 1000 MG tablet 1 tablet Orally twice daily; Duration: 7 days 12/03/23  Yes  [provider]  lisinopril  (ZESTRIL ) 10 MG tablet  05/14/19   [provider]  Omega-3 Fatty Acids (FISH OIL) 1000 MG CAPS Take by mouth daily.    [provider]  pregabalin (LYRICA) 50 MG capsule  07/23/18   [provider]    Allergies: Neomycin-bacitracin zn-polymyx    Review of Systems  Updated Vital Signs BP (!) 175/59   Pulse (!) 34   Temp 98 F (36.7 C) (Oral)   Resp 17   Ht 6' (1.829 m)   Wt 70.3 kg   SpO2 100%   BMI 21.02 kg/m   Physical Exam Vitals and nursing note reviewed.  HENT:     Head: Normocephalic and atraumatic.  Eyes:     Pupils: Pupils are equal, round, and reactive to light.  Cardiovascular:     Rate and Rhythm: Bradycardia present. Rhythm irregular.  Pulmonary:     Effort: Pulmonary effort is normal.     Breath sounds: Normal breath sounds.  Abdominal:     Palpations: Abdomen is soft.     Tenderness: There is no abdominal tenderness.  Skin:    General: Skin is warm and dry.  Neurological:     Mental Status: He is alert.  Psychiatric:        Mood and Affect: Mood normal.     (all labs ordered are listed, but only abnormal results are displayed) Labs Reviewed  CBC - Abnormal; Notable for the following components:      Result Value   RBC 4.16 (*)    All other components within normal limits  BASIC METABOLIC PANEL WITH GFR - Abnormal; Notable for the following components:   Sodium 132 (*)    Creatinine, Ser 1.43 (*)    Calcium 8.8 (*)    GFR, Estimated 51 (*)    All other components within normal limits  SURGICAL PCR SCREEN  MAGNESIUM  TROPONIN I (HIGH SENSITIVITY)  TROPONIN I (HIGH SENSITIVITY)    EKG: EKG Interpretation Date/Time:  Monday December 16 2023 07:45:26 EDT Ventricular Rate:  36 PR Interval:  158 QRS Duration:  147 QT Interval:  544 QTC Calculation: 421 R Axis:   90  Text Interpretation: Sinus bradycardia Probable left atrial enlargement Right bundle branch block Anteroseptal  infarct, age indeterminate Confirmed by Pamella Sharper [phone]) on 12/16/2023 10:47:35 AM  Radiology: [facility] Chest Portable 1 View Result Date: 12/16/2023 CLINICAL DATA:  Shortness of breath.  Heart block. EXAM: PORTABLE CHEST 1 VIEW COMPARISON:  06/26/2021 FINDINGS: The cardiopericardial silhouette is within normal limits for size. Mild hyperexpansion. Similar appearance left parahilar scarring with fiducial markers evident. No pulmonary edema, focal airspace consolidation, or pleural effusion. Multiple pulmonary nodule seen on chest CT 12/13/2023 not readily evident by x-ray. Telemetry leads overlie the chest. IMPRESSION: 1. No acute cardiopulmonary findings. 2. Similar appearance of left parahilar scarring with fiducial markers evident. Electronically Signed   By: Camellia Candle M.D.   On: 12/16/2023 08:19     .Critical Care  Performed by: Pamella Sharper LABOR, DO Authorized by: Pamella Sharper LABOR, DO   Critical care provider statement:    Critical care time (minutes):  40   Critical care was necessary to treat or prevent imminent or life-threatening deterioration of the following conditions:  Cardiac failure   Critical care was time spent personally by me on the following activities:  Development of treatment plan with patient or surrogate, discussions with consultants, evaluation of patient's response to treatment, examination of patient, ordering and review of laboratory studies, ordering and review of radiographic studies, ordering and performing treatments and interventions, pulse oximetry, re-evaluation of patient's condition, review of old charts and obtaining history from patient or surrogate   I assumed direction of critical care for this patient from another provider in my specialty: no     Care discussed with: admitting provider      Medications Ordered in the ED  acetaminophen  (TYLENOL ) tablet 650 mg (has no administration in time range)  ondansetron  (ZOFRAN ) injection 4 mg (has no  administration in time range)  0.9 %  sodium chloride  infusion (0 mLs Intravenous Stopped 12/16/23 1041)  gentamicin (GARAMYCIN) 80 mg in sodium chloride  0.9 % 500 mL irrigation (has no administration in time range)  sodium chloride  flush (NS) 0.9 % injection 3 mL (3 mLs Intravenous Given 12/16/23 1037)  sodium chloride  flush (NS) 0.9 % injection 3 mL (has no administration in time range)  ceFAZolin  (ANCEF ) IVPB 2g/100 mL premix (has no administration in time range)  amLODipine (NORVASC) tablet 5 mg (5 mg Oral Given 12/16/23 1037)  chlorhexidine  (PERIDEX ) 0.12 % solution 15 mL (15 mLs Mouth/Throat Given 12/16/23 0631)    Clinical Course as of 12/16/23 1048  Mon Dec 16, 2023  0950 Cardiology team has evaluated patient in the ED.  Plan for echocardiogram and then likely permanent pacemaker placement.  He will be admitted to cardiology service.  Blood pressure remains hypertensive  at this time in third-degree heart block [MP]    Clinical Course User Index [MP] Pamella Ozell LABOR, DO                                 Medical Decision Making 72 year old male with history as above presenting to the ED given concern for bradycardia.  Noted to be in third-degree heart block on preop EKG prior to his scheduled lung biopsy.  Hypertensive.  Awake alert.  Asymptomatic.  Labs drawn prior to arrival but will add on troponin and magnesium and obtain chest x-ray here.  Will page cardiology.  Pacing pads to bedside patient on monitor  Amount and/or Complexity of Data Reviewed Labs: ordered. Radiology: ordered.  Risk Decision regarding hospitalization.        Final diagnoses:  Complete heart block [facility])    ED Discharge Orders     None          Pamella Ozell LABOR, DO 12/16/23 1049

## 2023-12-17 ENCOUNTER — Observation Stay (HOSPITAL_COMMUNITY)

## 2023-12-17 ENCOUNTER — Encounter (HOSPITAL_COMMUNITY): Payer: Self-pay | Admitting: Cardiology

## 2023-12-17 DIAGNOSIS — R911 Solitary pulmonary nodule: Secondary | ICD-10-CM | POA: Diagnosis not present

## 2023-12-17 DIAGNOSIS — I442 Atrioventricular block, complete: Secondary | ICD-10-CM | POA: Diagnosis not present

## 2023-12-17 DIAGNOSIS — F17210 Nicotine dependence, cigarettes, uncomplicated: Secondary | ICD-10-CM | POA: Diagnosis not present

## 2023-12-17 DIAGNOSIS — Z79899 Other long term (current) drug therapy: Secondary | ICD-10-CM | POA: Diagnosis not present

## 2023-12-17 DIAGNOSIS — I10 Essential (primary) hypertension: Secondary | ICD-10-CM | POA: Diagnosis not present

## 2023-12-17 DIAGNOSIS — J449 Chronic obstructive pulmonary disease, unspecified: Secondary | ICD-10-CM | POA: Diagnosis not present

## 2023-12-17 DIAGNOSIS — J432 Centrilobular emphysema: Secondary | ICD-10-CM | POA: Diagnosis not present

## 2023-12-17 DIAGNOSIS — I4510 Unspecified right bundle-branch block: Secondary | ICD-10-CM | POA: Diagnosis not present

## 2023-12-17 DIAGNOSIS — Z85528 Personal history of other malignant neoplasm of kidney: Secondary | ICD-10-CM | POA: Diagnosis not present

## 2023-12-17 MED FILL — Midazolam HCl Inj 2 MG/2ML (Base Equivalent): INTRAMUSCULAR | Qty: 1.5 | Status: AC

## 2023-12-17 NOTE — Plan of Care (Signed)
  Problem: Education: Goal: Knowledge of cardiac device and self-care will improve Outcome: Adequate for Discharge Goal: Ability to safely manage health related needs after discharge will improve Outcome: Adequate for Discharge Goal: Individualized Educational Video(s) Outcome: Adequate for Discharge   Problem: Cardiac: Goal: Ability to achieve and maintain adequate cardiopulmonary perfusion will improve Outcome: Adequate for Discharge   Problem: Education: Goal: Knowledge of General Education information will improve Description: Including pain rating scale, medication(s)/side effects and non-pharmacologic comfort measures Outcome: Adequate for Discharge   Problem: Health Behavior/Discharge Planning: Goal: Ability to manage health-related needs will improve Outcome: Adequate for Discharge   Problem: Clinical Measurements: Goal: Ability to maintain clinical measurements within normal limits will improve Outcome: Adequate for Discharge Goal: Will remain free from infection Outcome: Adequate for Discharge Goal: Diagnostic test results will improve Outcome: Adequate for Discharge Goal: Respiratory complications will improve Outcome: Adequate for Discharge Goal: Cardiovascular complication will be avoided Outcome: Adequate for Discharge   Problem: Activity: Goal: Risk for activity intolerance will decrease Outcome: Adequate for Discharge   Problem: Nutrition: Goal: Adequate nutrition will be maintained Outcome: Adequate for Discharge   Problem: Coping: Goal: Level of anxiety will decrease Outcome: Adequate for Discharge   Problem: Elimination: Goal: Will not experience complications related to bowel motility Outcome: Adequate for Discharge Goal: Will not experience complications related to urinary retention Outcome: Adequate for Discharge   Problem: Safety: Goal: Ability to remain free from injury will improve Outcome: Adequate for Discharge   Problem: Pain  Managment: Goal: General experience of comfort will improve and/or be controlled Outcome: Adequate for Discharge   Problem: Skin Integrity: Goal: Risk for impaired skin integrity will decrease Outcome: Adequate for Discharge

## 2023-12-17 NOTE — Progress Notes (Signed)
 Talked with Dr. Otelia about patient's high BP's. Doctor informed me patient's BP's have been going down since extra Norvasc 5 mg's given at 1825 so no PRN BP meds needed at this time.

## 2023-12-17 NOTE — Care Management Obs Status (Signed)
 MEDICARE OBSERVATION STATUS NOTIFICATION   Patient Details  Name: Dylan Harvey MRN: [id number] Date of Birth: [birth year]   Medicare Observation Status Notification Given:  Yes    Nena LITTIE Coffee, RN 12/17/2023, 10:49 AM

## 2023-12-17 NOTE — Discharge Instructions (Signed)
 After Your Pacemaker   You have a Abbott Pacemaker  If you have a Medtronic or Biotronik device, plug in your home monitor once you get home, and no manual interaction is required.   If you have an Abbott or [employer] device, plug your home monitor once you get home, sit near the device, and press the large activation button. Sit nearby until the process is complete, usually notated by lights on the monitor.   If you were set up for monitoring using an app on your phone, make sure the app remains open in the background and the Bluetooth remains on.  ACTIVITY Do not lift your arm above shoulder height for 1 week after your procedure. After 7 days, you may progress as below.  You should remove your sling 24 hours after your procedure, unless otherwise instructed by your provider.     Tuesday December 24, 2023  Wednesday December 25, 2023 Thursday December 26, 2023 Friday December 27, 2023   Do not lift, push, pull, or carry anything over 10 pounds with the affected arm until 6 weeks (Tuesday January 28, 2024 ) after your procedure.   You may drive AFTER your wound check, unless you have been told otherwise by your provider.   Ask your healthcare provider when you can go back to work   INCISION/Dressing If you are on a blood thinner such as Coumadin, Xarelto, Eliquis, Plavix, or Pradaxa please confirm with your provider when this should be resumed.   If large square, outer bandage is left in place, this can be removed after 24 hours from your procedure. Do not remove steri-strips or glue as below.   If a PRESSURE DRESSING (a bulky dressing that usually goes up over your shoulder) was applied or left in place, please follow instructions given by your provider on when to return to have this removed.   Monitor your Pacemaker site for redness, swelling, and drainage. Call the device clinic at [phone] if you experience these symptoms or fever/chills.  If your incision is sealed with  Steri-strips or staples, you may shower 7 days after your procedure or when told by your provider. Do not remove the steri-strips or let the shower hit directly on your site. You may wash around your site with soap and water .    If you were discharged in a sling, please do not wear this during the day more than 48 hours after your surgery unless otherwise instructed. This may increase the risk of stiffness and soreness in your shoulder.   Avoid lotions, ointments, or perfumes over your incision until it is well-healed.  You may use a hot tub or a pool AFTER your wound check appointment if the incision is completely closed.  Pacemaker Alerts:  Some alerts are vibratory and others beep. These are NOT emergencies. Please call our office to let us  know. If this occurs at night or on weekends, it can wait until the next business day. Send a remote transmission.  If your device is capable of reading fluid status (for heart failure), you will be offered monthly monitoring to review this with you.   DEVICE MANAGEMENT Remote monitoring is used to monitor your pacemaker from home. This monitoring is scheduled every 91 days by our office. It allows us  to keep an eye on the functioning of your device to ensure it is working properly. You will routinely see your Electrophysiologist annually (more often if necessary).  This will appear as a REMOTE check on your  MyChart schedule. These are automatic and there is nothing for you to manually do unless otherwise instructed.  You should receive your ID card for your new device in 4-8 weeks. Keep this card with you at all times once received. Consider wearing a medical alert bracelet or necklace.  Your Pacemaker may be MRI compatible. This will be discussed at your next office visit/wound check.  You should avoid contact with strong electric or magnetic fields.   Do not use amateur (ham) radio equipment or electric (arc) welding torches. MP3 player headphones with  magnets should not be used. Some devices are safe to use if held at least 12 inches (30 cm) from your Pacemaker. These include power tools, lawn mowers, and speakers. If you are unsure if something is safe to use, ask your health care provider.  When using your cell phone, hold it to the ear that is on the opposite side from the Pacemaker. Do not leave your cell phone in a pocket over the Pacemaker.  You may safely use electric blankets, heating pads, computers, and microwave ovens.  Call the office right away if: You have chest pain. You feel more short of breath than you have felt before. You feel more light-headed than you have felt before. Your incision starts to open up.  This information is not intended to replace advice given to you by your health care provider. Make sure you discuss any questions you have with your health care provider.

## 2023-12-17 NOTE — Discharge Summary (Addendum)
 ELECTROPHYSIOLOGY PROCEDURE DISCHARGE SUMMARY    Patient ID: Dylan Harvey,  MRN: [id number], DOB/AGE: 74-29-1952 74 y.o.  Admit date: 12/16/2023 Discharge date: 12/17/2023  Primary Care Physician: Shayne Anes, MD  Primary Cardiologist: Dr. Hobart (inactive, last seen 2023) Electrophysiologist: new this admission, Dr. Kennyth)  Primary Discharge Diagnosis:  CHB  Secondary Discharge Diagnosis:  Lung cancer HTN COPD/asthma  Allergies  Allergen Reactions   Neosporin Original [Neomycin-Bacitracin Zn-Polymyx] Swelling and Rash   Zestril  [Lisinopril ] Other (See Comments)    Hx of ARF from lisinopril  use     Procedures This Admission:  1.  Implantation of a Abbott dual chamber PPM on 12/17/23 by Dr Kennyth.   There were no immediate post procedure complications. CXR on 12/17/23 demonstrated no pneumothorax status post device implantation.   Brief HPI: Dylan Harvey is a 73 y.o. male was scheduled for elective/out patient bronchoscopy, on arrival found to be in CHB, pt unaware, denied symptoms.  He was referred to the ER EP team engaged Noting long hx of conduction system disease now advanced to CHB.  BP stable, no syncope, wife mentioned in the last year waxing/waning stamina, without overt symptoms of bradycardia otherwise. Given no reversible causes advised admission and PPM implant. Risks, benefits, and alternatives to PPM implantation were reviewed with the patient who wished to proceed.   Hospital Course:  The patient was admitted labs unremarkable, echo with preserved LVEF, MR suspect driven by heart block.  He underwent implantation of a PPM with details as outlined in the procedure report.  He was monitored on telemetry overnight which demonstrated SR/V paced.  Left chest was without hematoma or ecchymosis.  The device was interrogated and found to be functioning normally.  CXR was obtained and demonstrated no pneumothorax status post device implantation.  Wound  care, arm mobility, and restrictions were reviewed with the patient.  The patient feels well, denies any CP/SOB, with minimal site discomfort.  He was examined by Dr. Kennyth and considered stable for discharge to home.    Physical Exam: Vitals:   12/16/23 2256 12/16/23 2347 12/17/23 0833 12/17/23 1102  BP: (!) 148/89 (!) 156/92 (!) 165/91 (!) 125/92  Pulse:   72   Resp: 20 17 15    Temp:  97.7 F (36.5 C) 98.2 F (36.8 C)   TempSrc:  Oral Oral   SpO2:   98%   Weight:      Height:         GEN- The patient is well appearing, alert and oriented x 3 today.   HEENT: normocephalic, atraumatic; sclera clear, conjunctiva pink; hearing intact; oropharynx clear; neck supple, no JVP Lungs- CTA b/l, normal work of breathing.  No wheezes, rales, rhonchi Heart- RRR, no murmurs, rubs or gallops, PMI not laterally displaced GI- soft, non-tender, non-distended Extremities- no clubbing, cyanosis, or edema MS- no significant deformity or atrophy Skin- warm and dry, no rash or lesion, left chest without hematoma/ecchymosis Psych- euthymic mood, full affect Neuro- no gross deficits   Labs:   Lab Results  Component Value Date   WBC 7.4 12/16/2023   HGB 13.5 12/16/2023   HCT 39.0 12/16/2023   MCV 93.8 12/16/2023   PLT 178 12/16/2023    Recent Labs  Lab 12/16/23 0606  NA 132*  K 3.8  CL 102  CO2 23  BUN 20  CREATININE 1.43*  CALCIUM 8.8*  GLUCOSE 99    Discharge Medications:  Allergies as of 12/17/2023  Reactions   Neosporin Original [neomycin-bacitracin Zn-polymyx] Swelling, Rash   Zestril  [lisinopril ] Other (See Comments)   Hx of ARF from lisinopril  use        Medication List     TAKE these medications    ALFALFA PO Take 1 tablet by mouth daily.   amLODipine 5 MG tablet Commonly known as: NORVASC Take 5 mg by mouth at bedtime.   b complex vitamins tablet Take 1 tablet by mouth daily.   ezetimibe 10 MG tablet Commonly known as: ZETIA Take 10 mg by mouth  daily with lunch.   fexofenadine 60 MG tablet Commonly known as: ALLEGRA Take 60 mg by mouth daily as needed for allergies.   FLAX SEED OIL PO Take 1 capsule by mouth daily.   PRESCRIPTION MEDICATION Place 1 application  into the right ear See admin instructions. Compounded capsule (powder) Sulfacetamide amphoteracin b + chloramphenincol + hydrocortisone. Apply to right ear every other day.   PSYLLIUM FIBER PO Take 1 capsule by mouth daily.   sodium chloride  0.65 % Soln nasal spray Commonly known as: OCEAN Place 1 spray into the nose at bedtime.   VITAMIN C PO Take 1 tablet by mouth daily.   VITAMIN D-3 PO Take 1 tablet by mouth daily.               Discharge Care Instructions  (From admission, onward)           Start     Ordered   12/17/23 0000  Discharge wound care:       Comments: As per AVS   12/17/23 1124            Disposition: home Discharge Instructions     Diet - low sodium heart healthy   Complete by: As directed    Discharge wound care:   Complete by: As directed    As per AVS   Increase activity slowly   Complete by: As directed         Signed, Charlies Arthur, PA-C 12/17/2023 11:24 AM   I have seen, examined the patient, and reviewed the above assessment and plan.    Hospital Course: Patient was sent to ED on 9/29 from scheduled bronchoscopy due to presence of complete heart block on EKG.  Patient also endorsed some fatigue over the past few months.  He underwent dual-chamber pacemaker implant on 9/29.  He was monitored overnight per usual protocol.  There were no acute overnight events or obvious complications.  He reported feeling relatively well on day of discharge.  General: Well developed, in no acute distress.  Neck: No JVD.  Cardiac: Normal rate, regular rhythm.  Left chest pacer pocket without hematoma or bleeding. Resp: Normal work of breathing.  Ext: No edema.  Neuro: No gross focal deficits.  Psych: Normal affect.    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 - Bedside device interrogation was performed today with appropriate device function and stable lead parameters. - Usual post implant discharge instructions were provided regarding activity restrictions and wound care. - Follow-up has been arranged with our device clinic in 10 to 14 days.  Total duration of discharge encounter greater than 30 minutes.  [name], MD 12/17/2023 10:54 PM

## 2023-12-17 NOTE — TOC CM/SW Note (Signed)
 Transition of Care [facility]) - Inpatient Brief Assessment   Patient Details  Name: Dylan Harvey MRN: [id number] Date of Birth: 02-16-1951  Transition of Care [facility]) CM/SW Contact:    Sudie Erminio Deems, RN Phone Number: 12/17/2023, 11:33 AM   Clinical Narrative: Patient presented for evaluation of CHB. Patient post PPM. Plan for transition home today. PTA patient was independent from with spouse. Patient has insurance and PCP. No home needs identified at this time.    Transition of Care Asessment: Insurance and Status: Insurance coverage has been reviewed Patient has primary care physician: Yes Home environment has been reviewed: reviewed Prior level of function:: independent Prior/Current Home Services: No current home services Social Drivers of Health Review: SDOH reviewed no interventions necessary Readmission risk has been reviewed: Yes Transition of care needs: no transition of care needs at this time

## 2023-12-18 DIAGNOSIS — R918 Other nonspecific abnormal finding of lung field: Secondary | ICD-10-CM

## 2023-12-18 HISTORY — DX: Other nonspecific abnormal finding of lung field: R91.8

## 2023-12-19 ENCOUNTER — Telehealth: Payer: Self-pay

## 2023-12-19 DIAGNOSIS — R911 Solitary pulmonary nodule: Secondary | ICD-10-CM

## 2023-12-19 NOTE — Telephone Encounter (Addendum)
 Please schedule the following:  Provider performing procedure: Dorn Chill Diagnosis: two right upper lobe lung nodules  Which side if for nodule / mass? Right Procedure: Robotic bronch + EBUS   Has patient been spoken to by Provider and given informed consent? Yes Anesthesia: General Do you need Fluro? Yes Duration of procedure: 90 Date: 01/02/24 Time: PM Location: [facility] endo Does patient have OSA? No DM? No Or Latex allergy? No Medication Restriction/ Anticoagulate/Antiplatelet: None Pre-op Labs Ordered:determined by Anesthesia Imaging request: None   (If, SuperDimension CT Chest, please have STAT courier sent to ENDO)

## 2023-12-19 NOTE — Telephone Encounter (Signed)
 Copied from CRM #[id number]. Topic: Appointments - Scheduling Inquiry for Clinic >> Dec 18, 2023  4:57 PM Joesph PARAS wrote: Reason for CRM: Patient is calling to request to reschedule biopsy with Dr. Zaida, as it had to be cancelled because he needed a emergent pacemaker. Patient asking to not be called too early if possible, prefers 11 AM or after.

## 2023-12-20 NOTE — Telephone Encounter (Signed)
 Hey Dr.Hattar, please create a case request for this patient. Thank you!

## 2023-12-20 NOTE — Addendum Note (Signed)
 Addended by: ZAIDA MASON on: 12/20/2023 09:03 AM   Modules accepted: Orders

## 2023-12-20 NOTE — Telephone Encounter (Signed)
 Patient is aware of appt. Routing to [employer] for Authorization.

## 2023-12-26 ENCOUNTER — Ambulatory Visit: Attending: Cardiovascular Disease | Admitting: *Deleted

## 2023-12-26 DIAGNOSIS — I442 Atrioventricular block, complete: Secondary | ICD-10-CM | POA: Insufficient documentation

## 2023-12-26 NOTE — Progress Notes (Signed)
 Normal dual chamber pacemaker wound check. Presenting rhythm: AS/VP. Wound well healed. Routine testing performed. Thresholds, sensing, and impedance consistent with implant measurements and at 3.5V safety margin/auto capture until 3 month visit. No episodes. Reviewed arm restrictions to continue for 6 weeks total post op.  Pt enrolled in remote follow-up.

## 2023-12-26 NOTE — Telephone Encounter (Addendum)
 Patient called wanting to confirm issues that he may have had. 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 He concerned there might be issues with the procedure due to this.

## 2023-12-26 NOTE — Patient Instructions (Signed)

## 2023-12-27 LAB — CUP PACEART INCLINIC DEVICE CHECK
Date Time Interrogation Session: 20251009140916
Implantable Lead Connection Status: 753985
Implantable Lead Connection Status: 753985
Implantable Lead Implant Date: 20250929
Implantable Lead Implant Date: 20250929
Implantable Lead Location: 753859
Implantable Lead Location: 753860
Implantable Pulse Generator Implant Date: 20250929
Pulse Gen Model: 2272
Pulse Gen Serial Number: 8219539

## 2023-12-27 NOTE — Telephone Encounter (Signed)
 It has been canceled. Nothing further needed.

## 2023-12-30 ENCOUNTER — Ambulatory Visit: Payer: Self-pay | Admitting: Cardiology

## 2023-12-30 DIAGNOSIS — Z950 Presence of cardiac pacemaker: Secondary | ICD-10-CM | POA: Diagnosis not present

## 2023-12-30 DIAGNOSIS — K2100 Gastro-esophageal reflux disease with esophagitis, without bleeding: Secondary | ICD-10-CM | POA: Diagnosis not present

## 2023-12-30 DIAGNOSIS — I7090 Unspecified atherosclerosis: Secondary | ICD-10-CM | POA: Diagnosis not present

## 2023-12-30 DIAGNOSIS — Z8553 Personal history of malignant neoplasm of renal pelvis: Secondary | ICD-10-CM | POA: Diagnosis not present

## 2023-12-30 DIAGNOSIS — D126 Benign neoplasm of colon, unspecified: Secondary | ICD-10-CM | POA: Diagnosis not present

## 2023-12-30 DIAGNOSIS — C3490 Malignant neoplasm of unspecified part of unspecified bronchus or lung: Secondary | ICD-10-CM | POA: Diagnosis not present

## 2023-12-30 DIAGNOSIS — J449 Chronic obstructive pulmonary disease, unspecified: Secondary | ICD-10-CM | POA: Diagnosis not present

## 2023-12-30 DIAGNOSIS — E785 Hyperlipidemia, unspecified: Secondary | ICD-10-CM | POA: Diagnosis not present

## 2023-12-30 DIAGNOSIS — Z905 Acquired absence of kidney: Secondary | ICD-10-CM | POA: Diagnosis not present

## 2023-12-30 DIAGNOSIS — I10 Essential (primary) hypertension: Secondary | ICD-10-CM | POA: Diagnosis not present

## 2023-12-30 DIAGNOSIS — I2510 Atherosclerotic heart disease of native coronary artery without angina pectoris: Secondary | ICD-10-CM | POA: Diagnosis not present

## 2023-12-30 DIAGNOSIS — F17200 Nicotine dependence, unspecified, uncomplicated: Secondary | ICD-10-CM | POA: Diagnosis not present

## 2024-01-01 ENCOUNTER — Encounter (HOSPITAL_COMMUNITY): Payer: Self-pay | Admitting: Pulmonary Disease

## 2024-01-01 ENCOUNTER — Ambulatory Visit

## 2024-01-01 ENCOUNTER — Other Ambulatory Visit: Payer: Self-pay

## 2024-01-01 NOTE — Progress Notes (Addendum)
 PCP - Dr Oneil Neth Cardiologist - Dr [name]  Chest x-ray - 12/17/23, CT Chest 12/13/23 EKG - 12/16/23 Stress Test - n/a ECHO - 12/16/23 Cardiac Cath - n/a  ICD Pacemaker - Abbott dual-chamber PPM on 12/16/23 by Dr. Chrisandra  Last remote device check was on 12/26/23.  Rep. Redell Regal was notified of surgery date & time.  Meredith in the Endoscopy Dept was also notified. Perioperative ICD device orders were initiated and awaiting results.    Sleep Study -  n/a  Diabetes n/a  Aspirin & Blood Thinner Instructions:  n/a  NPO  Anesthesia review: Yes  STOP now taking any Aspirin (unless otherwise instructed by your surgeon), Aleve, Naproxen, Ibuprofen, Motrin, Advil, Goody's, BC's, all herbal medications, fish oil, and all vitamins.   Coronavirus Screening Does the patient have any of the following symptoms:  Cough yes/no: No Fever (>100.59F)  yes/no: No Runny nose yes/no: No Sore throat yes/no: No Difficulty breathing/shortness of breath  yes/no: No  Has the patient traveled in the last 14 days and where? yes/no: No  Patient's wife Rollene verbalized understanding of instructions that were given via phone.

## 2024-01-01 NOTE — Progress Notes (Signed)
 Anesthesia Chart Review: Same day workup  73 year old male with pertinent history including current smoker, emphysema, HTN, HLD, RCC s/p right nephro ureterectomy 08/2020, left lower lobe lung cancer s/p SBRT completed 01/2020.   Recent imaging concerning for recurrent disease.  He presented for bronchoscopy on 12/16/2023 but was noted to be bradycardic with third-degree AVB.  He was admitted and underwent implantation of an Abbott dual-chamber PPM on 12/17/2023 by Dr. Kennyth.  Echo showed LVEF 60 to 65%, normal RV, MR suspect driven by heart block.  He had EP follow-up on 12/26/2023 with wound check showing implant site well-healed, normal pacemaker function.  BMP and CBC 12/16/2023 reviewed, mild hyponatremia sodium 132, creatinine mildly elevated 1.43, otherwise unremarkable.  Patient will need day of surgery evaluation.  EKG 12/17/2023:  Atrial sensed ventricular paced rhythm.  Rate 66.  CT super D chest 12/13/2023: IMPRESSION: 1. Interval increase in size of the 2 right upper lobe pulmonary nodules, noted to be hypermetabolic on previous PET-CT. Imaging features are compatible with neoplasm. 2. Stable appearance of additional tiny left lung nodules. 3. Aortic Atherosclerosis (ICD10-I70.0) and Emphysema (ICD10-J43.9).  TTE 12/16/2023: 1. Left ventricular ejection fraction, by estimation, is 60 to 65%. The  left ventricle has normal function. The left ventricle has no regional  wall motion abnormalities. Left ventricular diastolic function could not  be evaluated.   2. Right ventricular systolic function is normal. The right ventricular  size is normal.   3. Diastolic MR, more a function of heart block and bradycardia. The  mitral valve is degenerative. Mild mitral valve regurgitation. No evidence  of mitral stenosis.   4. The aortic valve is tricuspid. There is mild calcification of the  aortic valve. Aortic valve regurgitation is mild. Aortic valve sclerosis  is present, with no  evidence of aortic valve stenosis.   5. The inferior vena cava is normal in size with greater than 50%  respiratory variability, suggesting right atrial pressure of 3 mmHg.      Lynwood Geofm RIGGERS [facility] Short Stay Center/Anesthesiology Phone [phone] 01/01/2024 12:22 PM

## 2024-01-01 NOTE — Anesthesia Preprocedure Evaluation (Signed)
 Anesthesia Evaluation  Patient identified by MRN, date of birth, ID band Patient awake    Reviewed: Allergy & Precautions, H&P , NPO status , Patient's Chart, lab work & pertinent test results  History of Anesthesia Complications Negative for: history of anesthetic complications  Airway Mallampati: II  TM Distance: >3 FB Neck ROM: Full    Dental no notable dental hx.    Pulmonary COPD, Current Smoker Hx of LLL malignancy    Pulmonary exam normal breath sounds clear to auscultation       Cardiovascular hypertension, Normal cardiovascular exam+ dysrhythmias + pacemaker  Rhythm:Regular Rate:Normal     Neuro/Psych neg Seizures negative neurological ROS  negative psych ROS   GI/Hepatic negative GI ROS, Neg liver ROS,,,  Endo/Other  negative endocrine ROS    Renal/GU Renal diseaseHx of renal cancer   negative genitourinary   Musculoskeletal  (+) Arthritis ,    Abdominal   Peds negative pediatric ROS (+)  Hematology negative hematology ROS (+)   Anesthesia Other Findings   Reproductive/Obstetrics negative OB ROS                              Anesthesia Physical Anesthesia Plan  ASA: 3  Anesthesia Plan: General   Post-op Pain Management:    Induction: Intravenous  PONV Risk Score and Plan: 1 and Ondansetron  and Dexamethasone   Airway Management Planned: Oral ETT  Additional Equipment: None  Intra-op Plan:   Post-operative Plan: Extubation in OR  Informed Consent: I have reviewed the patients History and Physical, chart, labs and discussed the procedure including the risks, benefits and alternatives for the proposed anesthesia with the patient or authorized representative who has indicated his/her understanding and acceptance.     Dental advisory given  Plan Discussed with: CRNA  Anesthesia Plan Comments: (PAT note by Lynwood Hope, PA-C: 73 year old male with pertinent  history including current smoker, emphysema, HTN, HLD, RCC s/p right nephro ureterectomy 08/2020, left lower lobe lung cancer s/p SBRT completed 01/2020.   Recent imaging concerning for recurrent disease.  He presented for bronchoscopy on 12/16/2023 but was noted to be bradycardic with third-degree AVB.  He was admitted and underwent implantation of an Abbott dual-chamber PPM on 12/17/2023 by Dr. Kennyth.  Echo showed LVEF 60 to 65%, normal RV, MR suspect driven by heart block.  He had EP follow-up on 12/26/2023 with wound check showing implant site well-healed, normal pacemaker function.  BMP and CBC 12/16/2023 reviewed, mild hyponatremia sodium 132, creatinine mildly elevated 1.43, otherwise unremarkable.  Patient will need day of surgery evaluation.  EKG 12/17/2023:  Atrial sensed ventricular paced rhythm.  Rate 66.  CT super D chest 12/13/2023: IMPRESSION: 1. Interval increase in size of the 2 right upper lobe pulmonary nodules, noted to be hypermetabolic on previous PET-CT. Imaging features are compatible with neoplasm. 2. Stable appearance of additional tiny left lung nodules. 3. Aortic Atherosclerosis (ICD10-I70.0) and Emphysema (ICD10-J43.9).  TTE 12/16/2023: 1. Left ventricular ejection fraction, by estimation, is 60 to 65%. The  left ventricle has normal function. The left ventricle has no regional  wall motion abnormalities. Left ventricular diastolic function could not  be evaluated.   2. Right ventricular systolic function is normal. The right ventricular  size is normal.   3. Diastolic MR, more a function of heart block and bradycardia. The  mitral valve is degenerative. Mild mitral valve regurgitation. No evidence  of mitral stenosis.   4. The aortic valve  is tricuspid. There is mild calcification of the  aortic valve. Aortic valve regurgitation is mild. Aortic valve sclerosis  is present, with no evidence of aortic valve stenosis.   5. The inferior vena cava is normal in size  with greater than 50%  respiratory variability, suggesting right atrial pressure of 3 mmHg.    )         Anesthesia Quick Evaluation

## 2024-01-02 ENCOUNTER — Encounter (HOSPITAL_COMMUNITY): Payer: Self-pay | Admitting: Pulmonary Disease

## 2024-01-02 ENCOUNTER — Ambulatory Visit (HOSPITAL_COMMUNITY)

## 2024-01-02 ENCOUNTER — Ambulatory Visit (HOSPITAL_COMMUNITY): Payer: Self-pay | Admitting: Physician Assistant

## 2024-01-02 ENCOUNTER — Ambulatory Visit (HOSPITAL_COMMUNITY)
Admission: RE | Admit: 2024-01-02 | Discharge: 2024-01-02 | Disposition: A | Discharge status: Home / Self Care | Attending: Pulmonary Disease | Admitting: Pulmonary Disease

## 2024-01-02 ENCOUNTER — Encounter: Payer: Self-pay | Admitting: Cardiology

## 2024-01-02 ENCOUNTER — Encounter (HOSPITAL_COMMUNITY)
Admission: RE | Disposition: A | Discharge status: Home / Self Care | Payer: Self-pay | Source: Home / Self Care | Attending: Pulmonary Disease

## 2024-01-02 ENCOUNTER — Telehealth: Payer: Self-pay | Admitting: Pulmonary Disease

## 2024-01-02 DIAGNOSIS — C3432 Malignant neoplasm of lower lobe, left bronchus or lung: Secondary | ICD-10-CM | POA: Insufficient documentation

## 2024-01-02 DIAGNOSIS — F17210 Nicotine dependence, cigarettes, uncomplicated: Secondary | ICD-10-CM | POA: Insufficient documentation

## 2024-01-02 DIAGNOSIS — I10 Essential (primary) hypertension: Secondary | ICD-10-CM

## 2024-01-02 DIAGNOSIS — C3411 Malignant neoplasm of upper lobe, right bronchus or lung: Secondary | ICD-10-CM | POA: Diagnosis not present

## 2024-01-02 DIAGNOSIS — Z8553 Personal history of malignant neoplasm of renal pelvis: Secondary | ICD-10-CM | POA: Insufficient documentation

## 2024-01-02 DIAGNOSIS — J439 Emphysema, unspecified: Secondary | ICD-10-CM | POA: Diagnosis not present

## 2024-01-02 DIAGNOSIS — J449 Chronic obstructive pulmonary disease, unspecified: Secondary | ICD-10-CM

## 2024-01-02 DIAGNOSIS — R911 Solitary pulmonary nodule: Secondary | ICD-10-CM

## 2024-01-02 DIAGNOSIS — Z905 Acquired absence of kidney: Secondary | ICD-10-CM | POA: Diagnosis not present

## 2024-01-02 HISTORY — DX: Presence of cardiac pacemaker: Z95.0

## 2024-01-02 HISTORY — PX: VIDEO BRONCHOSCOPY WITH ENDOBRONCHIAL NAVIGATION: SHX6175

## 2024-01-02 HISTORY — PX: ENDOBRONCHIAL ULTRASOUND: SHX5096

## 2024-01-02 SURGERY — VIDEO BRONCHOSCOPY WITH ENDOBRONCHIAL NAVIGATION
Anesthesia: General

## 2024-01-02 MED ORDER — LACTATED RINGERS IV SOLN: INTRAVENOUS | Status: DC

## 2024-01-02 MED ORDER — FENTANYL CITRATE (PF) 100 MCG/2ML IJ SOLN: 25.0000 ug | INTRAMUSCULAR | Status: DC | PRN

## 2024-01-02 MED ORDER — DEXAMETHASONE SOD PHOSPHATE PF 10 MG/ML IJ SOLN
INTRAMUSCULAR | Status: DC | PRN
  Administered 2024-01-02: 5 mg via INTRAVENOUS

## 2024-01-02 MED ORDER — ROCURONIUM BROMIDE 10 MG/ML (PF) SYRINGE
PREFILLED_SYRINGE | INTRAVENOUS | Status: DC | PRN
  Administered 2024-01-02: 10 mg via INTRAVENOUS
  Administered 2024-01-02: 70 mg via INTRAVENOUS
  Administered 2024-01-02: 10 mg via INTRAVENOUS

## 2024-01-02 MED ORDER — CHLORHEXIDINE GLUCONATE 0.12 % MT SOLN: 15.0000 mL | Freq: Once | OROMUCOSAL | Status: DC

## 2024-01-02 MED ORDER — PROPOFOL 10 MG/ML IV BOLUS
INTRAVENOUS | Status: DC | PRN
Start: 2024-01-02 — End: 2024-01-02
  Administered 2024-01-02: 150 mg via INTRAVENOUS

## 2024-01-02 MED ORDER — FENTANYL CITRATE (PF) 100 MCG/2ML IJ SOLN
INTRAMUSCULAR | Status: DC | PRN
  Administered 2024-01-02 (×2): 50 ug via INTRAVENOUS

## 2024-01-02 MED ORDER — ACETAMINOPHEN 10 MG/ML IV SOLN: 1000.0000 mg | Freq: Once | INTRAVENOUS | Status: DC | PRN

## 2024-01-02 MED ORDER — FENTANYL CITRATE (PF) 100 MCG/2ML IJ SOLN
INTRAMUSCULAR | Status: AC
  Filled 2024-01-02: qty 2

## 2024-01-02 MED ORDER — PHENYLEPHRINE HCL-NACL 20-0.9 MG/250ML-% IV SOLN
INTRAVENOUS | Status: DC | PRN
  Administered 2024-01-02: 30 ug/min via INTRAVENOUS

## 2024-01-02 MED ORDER — LIDOCAINE 2% (20 MG/ML) 5 ML SYRINGE
INTRAMUSCULAR | Status: DC | PRN
  Administered 2024-01-02: 100 mg via INTRAVENOUS

## 2024-01-02 MED ORDER — OXYCODONE HCL 5 MG PO TABS: 5.0000 mg | ORAL_TABLET | Freq: Once | ORAL | Status: DC | PRN

## 2024-01-02 MED ORDER — DROPERIDOL 2.5 MG/ML IJ SOLN: 0.6250 mg | Freq: Once | INTRAMUSCULAR | Status: DC | PRN

## 2024-01-02 MED ORDER — CHLORHEXIDINE GLUCONATE 0.12 % MT SOLN
OROMUCOSAL | Status: AC
  Filled 2024-01-02: qty 15

## 2024-01-02 MED ORDER — OXYCODONE HCL 5 MG/5ML PO SOLN: 5.0000 mg | Freq: Once | ORAL | Status: DC | PRN

## 2024-01-02 MED ORDER — SUGAMMADEX SODIUM 200 MG/2ML IV SOLN
INTRAVENOUS | Status: DC | PRN
  Administered 2024-01-02: 286.8 mg via INTRAVENOUS

## 2024-01-02 MED ORDER — ONDANSETRON HCL 4 MG/2ML IJ SOLN
INTRAMUSCULAR | Status: DC | PRN
  Administered 2024-01-02: 4 mg via INTRAVENOUS

## 2024-01-02 NOTE — Transfer of Care (Signed)
 Immediate Anesthesia Transfer of Care Note  Patient: Dylan Harvey  Procedure(s) Performed: VIDEO BRONCHOSCOPY WITH ENDOBRONCHIAL NAVIGATION ENDOBRONCHIAL ULTRASOUND (EBUS)  Patient Location: PACU  Anesthesia Type:General  Level of Consciousness: awake  Airway & Oxygen Therapy: Patient Spontanous Breathing  Post-op Assessment: Report given to RN and Post -op Vital signs reviewed and stable  Post vital signs: Reviewed and stable  Last Vitals:  Vitals Value Taken Time  BP 117/91 01/02/24 13:45  Temp 37.2 C 01/02/24 13:45  Pulse 63 01/02/24 13:49  Resp 23 01/02/24 13:49  SpO2 94 % 01/02/24 13:49  Vitals shown include unfiled device data.  Last Pain:  Vitals:   01/02/24 0933  TempSrc:   PainSc: 0-No pain         Complications: No notable events documented.

## 2024-01-02 NOTE — Interval H&P Note (Signed)
 History and Physical Interval Note:  01/02/2024 8:55 AM  Dylan Harvey  has presented today for surgery, with the diagnosis of Lung nodule.  The various methods of treatment have been discussed with the patient and family. After consideration of risks, benefits and other options for treatment, the patient has consented to  Procedure(s): VIDEO BRONCHOSCOPY WITH ENDOBRONCHIAL NAVIGATION (N/A) ENDOBRONCHIAL ULTRASOUND (EBUS) (N/A) as a surgical intervention.  The patient's history has been reviewed, patient examined, no change in status, stable for surgery.  I have reviewed the patient's chart and labs.  Questions were answered to the patient's satisfaction.     Dorn KATHEE Chill

## 2024-01-02 NOTE — Progress Notes (Signed)
 PERIOPERATIVE PRESCRIPTION FOR IMPLANTED CARDIAC DEVICE PROGRAMMING  Patient Information: Name:  Dylan Harvey  DOB:  1951-03-15  MRN:  [id number]   Planned Procedure: Robotic Assisted Navigational   Bronchoscopy with endobronchial ultrasound   Surgeon: Dr Dorn Chill   Date of Procedure: 01/02/24   Cautery will be used.   Position during surgery: Supine   Device Information:  Clinic EP Physician: [name], MD  Device Type:  Pacemaker Manufacturer and Phone #:  St. [employer]/Abbott: [phone] Pacemaker Dependent?:  Yes.   Date of Last Device Check:  12/26/2023  Normal Device Function?:  Yes.    Electrophysiologist's Recommendations:  Have magnet available. Provide continuous ECG monitoring when magnet is used or reprogramming is to be performed.  Procedure may interfere with device function.  Magnet should be placed over device during procedure.  Per [facility] [address] [name], [place]  8:23 AM 01/02/2024

## 2024-01-02 NOTE — Op Note (Signed)
 Video Bronchoscopy with Robotic Assisted Bronchoscopic Navigation   Date of Operation: 01/02/2024   Pre-op Diagnosis: RUL Nodules   Post-op Diagnosis: RUL Nodules  Surgeon: Dorn Chill, MD  Assistants: n/a  Anesthesia: General endotracheal anesthesia  Operation: Flexible video fiberoptic bronchoscopy with robotic assistance and biopsies.  Estimated Blood Loss: Minimal  Complications: None  Indications and History: Dylan Harvey is a 73 y.o. 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  Recommendation made to achieve a tissue diagnosis via robotic assisted navigational bronchoscopy.  The risks, benefits, complications, treatment options and expected outcomes were discussed with the patient.  The possibilities of pneumothorax, pneumonia, reaction to medication, pulmonary aspiration, perforation of a viscus, bleeding, failure to diagnose a condition and creating a complication requiring transfusion or operation were discussed with the patient who freely signed the consent.    Description of Procedure: The patient was seen in the Preoperative Area, was examined and was deemed appropriate to proceed.  The patient was taken to [facility] Endoscopy room 3, identified as Dylan Harvey and the procedure verified as Flexible Video Fiberoptic Bronchoscopy.  A Time Out was held and the above information confirmed.   Prior to the date of the procedure a high-resolution CT scan of the chest was performed. Utilizing ION software program a virtual tracheobronchial tree was generated to allow the creation of distinct navigation pathways to the patient's parenchymal abnormalities. After being taken to the operating room general anesthesia was initiated and the patient  was orally intubated. The video fiberoptic bronchoscope was introduced via the endotracheal tube and a general inspection was performed  which showed normal right and left lung anatomy. Aspiration of the bilateral mainstems was completed to remove any remaining secretions. Robotic catheter inserted into patient's endotracheal tube.   Target #1 RUL central nodule: The distinct navigation pathways prepared prior to this procedure were then utilized to navigate to patient's lesion identified on CT scan. The robotic catheter was secured into place and the vision probe was withdrawn.  Lesion location was approximated using fluoroscopy.  Local registration and targeting was performed using [employer] Cios mobile C-arm three-dimensional imaging. Under fluoroscopic guidance transbronchial needle brushings, transbronchial needle biopsies, and transbronchial forceps biopsies were performed to be sent for cytology and pathology.  Needle-in-lesion was confirmed using Cios mobile C-arm.    Target #2 RUL Anterior peripheral nodule: The distinct navigation pathways prepared prior to this procedure were then utilized to navigate to patient's lesion identified on CT scan. The robotic catheter was secured into place and the vision probe was withdrawn.  Lesion location was approximated using fluoroscopy.  Local registration and targeting was performed using [employer] Cios mobile C-arm three-dimensional imaging. Under fluoroscopic guidance transbronchial needle brushings, transbronchial needle biopsies, and transbronchial forceps biopsies were performed to be sent for cytology and pathology.  Needle-in-lesion was confirmed using Cios mobile C-arm.  A bronchioalveolar lavage was performed in the RUL peripheral lesion and sent for microbiology.    EBUS: No enlarged lymph nodes noted at 11R, 7, 4R or 4L. No biopsies performed of lymph nodes.   At the end of the procedure a general airway inspection was performed and there was no evidence of active bleeding. The bronchoscope was removed.  The patient tolerated the procedure well. There was  no significant blood loss and there were no obvious complications. A post-procedural chest x-ray is pending.  Samples Target #1: 1.  Transbronchial needle brushings from RUL central nodule 2. Transbronchial needle biopsies from RUL central nodule 3. Transbronchial forceps biopsies from RUL central nodule   Samples Target #2: 1. Transbronchial needle biopsies from RUL anterior peripheral nodule 2. Bronchoalveolar lavage from RUL anterior peripheral nodule   Plans:  The patient will be discharged from the PACU to home when recovered from anesthesia and after chest x-ray is reviewed. We will review the cytology, pathology and microbiology results with the patient when they become available. Outpatient followup will be with Lauraine Lites, NP.

## 2024-01-02 NOTE — Procedures (Signed)
 Procedure Note  Patient: Dylan Harvey  [employer] Cios mobile C-arm was utilized to identify and biopsy RUL nodules.  Needle-in-lesion was confirmed using real-time Cios imaging, and images were uploaded to PACS.   Target 1, central RUL Nodule   Target 2, anterior peripheral RUL nodule   Dorn Chill, MD [facility] Office: [phone]   See Amion for personal pager PCCM on call pager [phone] until 7pm. Please call Elink 7p-7a. [phone]

## 2024-01-02 NOTE — Telephone Encounter (Signed)
 Patient scheduled 01/10/2024 at 11:30am with Lauraine. Patient is aware- nothing further needed

## 2024-01-02 NOTE — Telephone Encounter (Signed)
 Please schedule follow up with Lauraine Lites on 10/22, 10/23 or 10/24 to follow up on bronchoscopy results.  Thanks, JD

## 2024-01-02 NOTE — Anesthesia Procedure Notes (Signed)
 Procedure Name: Intubation Date/Time: 01/02/2024 12:05 PM  Performed by: Virgil Ee, CRNAPre-anesthesia Checklist: Patient identified, Patient being monitored, Timeout performed, Emergency Drugs available and Suction available Patient Re-evaluated:Patient Re-evaluated prior to induction Oxygen Delivery Method: Circle system utilized Preoxygenation: Pre-oxygenation with 100% oxygen Induction Type: IV induction Ventilation: Mask ventilation without difficulty Laryngoscope Size: Mac and 4 Grade View: Grade II Tube type: Oral Tube size: 8.5 mm Number of attempts: 1 Airway Equipment and Method: Stylet Placement Confirmation: ETT inserted through vocal cords under direct vision, positive ETCO2 and breath sounds checked- equal and bilateral Secured at: 23 cm Tube secured with: Tape Dental Injury: Teeth and Oropharynx as per pre-operative assessment

## 2024-01-03 NOTE — Anesthesia Postprocedure Evaluation (Signed)
 Anesthesia Post Note  Patient: Dylan Harvey  Procedure(s) Performed: VIDEO BRONCHOSCOPY WITH ENDOBRONCHIAL NAVIGATION ENDOBRONCHIAL ULTRASOUND (EBUS)     Patient location during evaluation: PACU Anesthesia Type: General Level of consciousness: awake and alert Pain management: pain level controlled Vital Signs Assessment: post-procedure vital signs reviewed and stable Respiratory status: spontaneous breathing, nonlabored ventilation, respiratory function stable and patient connected to nasal cannula oxygen Cardiovascular status: blood pressure returned to baseline and stable Postop Assessment: no apparent nausea or vomiting Anesthetic complications: no   No notable events documented.  Last Vitals:  Vitals:   01/02/24 1415 01/02/24 1430  BP: 118/65 119/63  Pulse: 63 61  Resp: 19 17  Temp:  36.7 C  SpO2: 93% 93%    Last Pain:  Vitals:   01/02/24 1415  TempSrc:   PainSc: 0-No pain                 Thom JONELLE Peoples

## 2024-01-04 LAB — ACID FAST SMEAR (AFB, MYCOBACTERIA): Acid Fast Smear: NEGATIVE

## 2024-01-05 ENCOUNTER — Encounter (HOSPITAL_COMMUNITY): Payer: Self-pay | Admitting: Pulmonary Disease

## 2024-01-05 LAB — CULTURE, BAL-QUANTITATIVE W GRAM STAIN: Culture: NO GROWTH

## 2024-01-07 LAB — CYTOLOGY - NON PAP

## 2024-01-07 LAB — AEROBIC/ANAEROBIC CULTURE W GRAM STAIN (SURGICAL/DEEP WOUND): Culture: NO GROWTH

## 2024-01-08 ENCOUNTER — Telehealth: Payer: Self-pay

## 2024-01-08 NOTE — Telephone Encounter (Signed)
 Copied from CRM #[id number]. Topic: General - Other >> Jan 07, 2024 10:45 AM Corean SAUNDERS wrote: Reason for CRM: Dr. LeGolvan is requesting a call back from Dr. Ples nurse when available regarding this patient. Please call back at [phone]   Spoke w/ Dr. LeGolvan  Rely message to Dr. Pearley   -NFN

## 2024-01-08 NOTE — Telephone Encounter (Signed)
 Copied from CRM #[id number]. Topic: General - Other >> Jan 07, 2024 10:45 AM Corean SAUNDERS wrote: Reason for CRM: Dr. LeGolvan is requesting a call back from Dr. Ples nurse when available regarding this patient. Please call back at [phone]

## 2024-01-10 ENCOUNTER — Ambulatory Visit (INDEPENDENT_AMBULATORY_CARE_PROVIDER_SITE_OTHER): Admitting: Acute Care

## 2024-01-10 ENCOUNTER — Ambulatory Visit: Admitting: Acute Care

## 2024-01-10 ENCOUNTER — Encounter: Payer: Self-pay | Admitting: Acute Care

## 2024-01-10 VITALS — BP 127/83 | HR 67 | Temp 97.5°F | Ht 72.0 in | Wt 156.6 lb

## 2024-01-10 DIAGNOSIS — F17200 Nicotine dependence, unspecified, uncomplicated: Secondary | ICD-10-CM

## 2024-01-10 DIAGNOSIS — C3411 Malignant neoplasm of upper lobe, right bronchus or lung: Secondary | ICD-10-CM

## 2024-01-10 DIAGNOSIS — R9389 Abnormal findings on diagnostic imaging of other specified body structures: Secondary | ICD-10-CM | POA: Diagnosis not present

## 2024-01-10 DIAGNOSIS — R942 Abnormal results of pulmonary function studies: Secondary | ICD-10-CM | POA: Diagnosis not present

## 2024-01-10 DIAGNOSIS — Z9889 Other specified postprocedural states: Secondary | ICD-10-CM

## 2024-01-10 DIAGNOSIS — C3490 Malignant neoplasm of unspecified part of unspecified bronchus or lung: Secondary | ICD-10-CM

## 2024-01-10 DIAGNOSIS — R911 Solitary pulmonary nodule: Secondary | ICD-10-CM

## 2024-01-10 NOTE — Progress Notes (Addendum)
 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 He will be followed by Dr. Zaida.   01/10/2024 Discussed the use of AI scribe software for clinical note transcription with the patient, who gave verbal consent to proceed.  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     Patient was referred to pulmonary due to findings on surveillance CT imaging as well as PET.  He is noted to have a CT chest performed in April 2025 which showed a new 7 mm right upper lobe nodular opacity.  Repeat CT scan in July showed an increase in the size of his right upper lobe nodule now measuring 11 x 7 mm.  PET/CT performed which showed PET avid enlarging right upper lobe nodule as well as a 7 mm hypermetabolic nodule in the medial aspect of the right upper lobe.  There was no evidence of recurrent disease on the left side.  No evidence of mediastinal or hilar lymph node involvement.    History of Present Illness Dylan Harvey is a 73 year old male with stage 1A lung carcinoma who presents for evaluation of new lung nodules.  He has a history of stage 1A lung carcinoma diagnosed in 2021, treated with radiation therapy. A recent surveillance scan revealed a PET avid lung nodule, leading to a biopsy. We have reviewed the biopsy results of 2 targets in the right upper lobe of the lung.  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 There are two nodules: one measuring 11 by 7 millimeters and another measuring 7  millimeters.Both were positive for squamous cell carcinoma.  Pt. States he did well after the procedure. No bleeding, fever, infection, or worsening shortness of breath following the biopsy procedure. No recent weight loss.  We discussed plans moving forward. I have placed referrals to radiation oncology, medical oncology, and thoracic surgery to be evaluated for treatment options.  All pulmonary function testing done in 2020 shows that the patient is potentially a surgical candidate.  As he has continued to smoke I will repeat pulmonary function testing for best evaluation of surgery as an option.  I have placed order for MRI brain to complete staging.He has a pacemaker implanted on December 16, 2023, manufactured by [employer]. I have provided the information on the order for the MRI.I have asked that they notify me if the order needs to be changes to a CT Brain.  He resides in [place].  Patient is currently asymptomatic.  No weight loss or other concerns.   Pacemaker Info      Test Results: Cytology A. LUNG, RUL TARGET #1, FINE NEEDLE ASPIRATION  BIOPSIES:  - Squamous cell carcinoma   B. LUNG, RUL TARGET #1, BRUSHING:  - Squamous cell carcinoma   C. LUNG, RUL TARGET #2, FINE NEEDLE ASPIRATION:  - Squamous cell carcinoma   Super D CT Chest 12/13/2023 Interval increase in size of the 2 right upper lobe pulmonary nodules, noted to be hypermetabolic on previous PET-CT. Imaging features are compatible with neoplasm. 2. Stable appearance of additional tiny left lung nodules. 3.  Aortic Atherosclerosis (ICD10-I70.0) and Emphysema (ICD10-J43.9).     10/23/2023 PET The enlarging peripheral right upper lobe pulmonary nodule is hypermetabolic and consistent with either a metachronous lung cancer or metastatic lesion. 2. 7 mm hypermetabolic nodule in the medial aspect of the right upper lobe is also consistent with a small neoplasm, likely metastatic. 3. Stable extensive post treatment  changes involving the left hilum and left upper lobe and left lower lobe. No findings suspicious for recurrent tumor. 4. No mediastinal or hilar adenopathy. 5. No findings for abdominal/pelvic metastatic disease or osseous metastatic disease. 6. Aortic atherosclerosis.    10/07/2023 CT chest Increased size of the peripheral irregular nodular opacity in the right upper lobe measuring 11 x 7 mm, consider further evaluation with nuclear medicine PET-CT. 2. Stable post treatment scarring in the perihilar left lung without new nodularity. 3. Stable 9 mm short axis lymph node in the right hilum. 4. Aortic Atherosclerosis (ICD10-I70.0) and Emphysema (ICD10-J43.9).  BAL, Aerobic and anaerobic cx negative for growth. Fungal, AFB negative      Latest Ref Rng & Units 12/16/2023    6:06 AM 12/04/2021    3:51 PM 01/18/2021    4:23 PM  CBC  WBC 4.0 - 10.5 K/uL 7.4  7.4  7.3   Hemoglobin 13.0 - 17.0 g/dL 86.4  85.9  88.2   Hematocrit 39.0 - 52.0 % 39.0  39.9  33.8   Platelets 150 - 400 K/uL 178  181  185        Latest Ref Rng & Units 12/16/2023    6:06 AM 12/04/2021    3:51 PM 01/18/2021    4:23 PM  BMP  Glucose 70 - 99 mg/dL 99  862  96   BUN 8 - 23 mg/dL 20  21  36   Creatinine 0.61 - 1.24 mg/dL 8.56  8.65  8.14   Sodium 135 - 145 mmol/L 132  133  132   Potassium 3.5 - 5.1 mmol/L 3.8  4.8  5.3   Chloride 98 - 111 mmol/L 102  97  98   CO2 22 - 32 mmol/L 23  27  27    Calcium 8.9 - 10.3 mg/dL 8.8  89.5  9.6     BNP No results found for: BNP  ProBNP No results found for: PROBNP  PFT    Component Value Date/Time   FEV1PRE 2.35 05/26/2018 0953   FEV1POST 2.49 05/26/2018 0953   FVCPRE 4.06 05/26/2018 0953   FVCPOST 4.23 05/26/2018 0953   TLC 7.38 05/26/2018 0953   DLCOUNC 15.63 05/26/2018 0953   PREFEV1FVCRT 58 05/26/2018 0953   PSTFEV1FVCRT 59 05/26/2018 0953    DG Chest Port 1 View Result Date: 01/02/2024 CLINICAL DATA:  Status post biopsy. EXAM: PORTABLE CHEST 1  VIEW COMPARISON:  Chest radiograph dated 12/17/2023. FINDINGS: Similar appearance of reticulonodular density in the left mid lung field with associated biopsy clips. No new consolidation. There is no pleural effusion or pneumothorax. The cardiac silhouette is within normal limits. Left pectoral pacemaker device. No acute osseous pathology. IMPRESSION: 1. No pneumothorax. 2. Similar appearance of reticulonodular density in the left mid lung field. Electronically Signed   By: Vanetta Chou M.D.   On: 01/02/2024 14:25   DG C-ARM BRONCHOSCOPY Result Date: 01/02/2024 C-ARM BRONCHOSCOPY: Fluoroscopy was utilized by the requesting physician.  No radiographic interpretation.   DG C-Arm 1-60 Min-No Report Result Date: 01/02/2024 Fluoroscopy was utilized by the requesting physician.  No radiographic interpretation.   CUP PACEART INCLINIC  DEVICE CHECK Result Date: 12/27/2023 Normal dual chamber pacemaker wound check. Presenting rhythm: AS/VP. Wound well healed. Routine testing performed. Thresholds, sensing, and impedance consistent with implant measurements and at 3.5V safety margin/auto capture until 3 month visit. No episodes. Reviewed arm restrictions to continue for 6 weeks total post op.  Pt enrolled in remote follow-up.Bard Silvan, BSN, RN  DG Chest 2 View Result Date: 12/17/2023 CLINICAL DATA:  73 year old male cardiac device placement. EXAM: CHEST - 2 VIEW COMPARISON:  Portable chest yesterday and earlier. FINDINGS: PA and lateral views 0445 hours. New left chest dual lead cardiac pacemaker in place. Leads terminates at right atrium and right ventricle levels. Stable lung volumes. Chronic curvilinear scarring lateral to the left hilum with multiple small post bronchoscopic biopsy type surgical clips in the left lung appear unchanged since 06/26/2021. Mediastinal contours are stable and within normal limits. No pneumothorax, pulmonary edema, pleural effusion. Centrilobular emphysema demonstrated by CT  this month. Anterior inferior right upper lobe, right minor fissure level hypermetabolic nodule on recent CT, PET-CT is visible on the lateral view (please see that report) No acute osseous abnormality identified. Negative visible bowel gas. IMPRESSION: 1. New left chest dual lead cardiac pacemaker. No adverse features. 2. Emphysema (ICD10-J43.9). Lung stable from recent CT and PET-CT: Known hypermetabolic right anterior inferior upper lobe nodule/tumor. Electronically Signed   By: VEAR Hurst M.D.   On: 12/17/2023 05:02   EP PPM/ICD IMPLANT Result Date: 12/16/2023  CONCLUSIONS:  1. Successful dual chamber pacemaker implant with left bundle branch area pacing lead.  2.  No early apparent complications. [name], MD, [facility], [facility] Cardiac Electrophysiology   ECHOCARDIOGRAM COMPLETE Result Date: 12/16/2023    ECHOCARDIOGRAM REPORT   Patient Name:   CHRISOPHER PUSTEJOVSKY Date of Exam: 12/16/2023 Medical Rec #:  [id number]        Height:       72.0 in Accession #:    [id number]       Weight:       155.0 lb Date of Birth:  10-02-1950        BSA:          1.912 m Patient Age:    73 years         BP:           178/70 mmHg Patient Gender: M                HR:           35 bpm. Exam Location:  Inpatient Procedure: 2D Echo (Both Spectral and Color Flow Doppler were utilized during            procedure). Indications:     Abnormal EKG  History:         Patient has no prior history of Echocardiogram examinations.  Sonographer:     Charmaine Gaskins Referring Phys:  [id number] RENEE LYNN URSUY Diagnosing Phys: Morene Brownie IMPRESSIONS  1. Left ventricular ejection fraction, by estimation, is 60 to 65%. The left ventricle has normal function. The left ventricle has no regional wall motion abnormalities. Left ventricular diastolic function could not be evaluated.  2. Right ventricular systolic function is normal. The right ventricular size is normal.  3. Diastolic MR, more a function of heart block and bradycardia. The mitral valve is  degenerative. Mild mitral valve regurgitation. No evidence of mitral stenosis.  4. The aortic valve is tricuspid. There is mild calcification of the aortic valve. Aortic valve regurgitation is mild. Aortic valve  sclerosis is present, with no evidence of aortic valve stenosis.  5. The inferior vena cava is normal in size with greater than 50% respiratory variability, suggesting right atrial pressure of 3 mmHg. FINDINGS  Left Ventricle: Left ventricular ejection fraction, by estimation, is 60 to 65%. The left ventricle has normal function. The left ventricle has no regional wall motion abnormalities. The left ventricular internal cavity size was normal in size. There is  no left ventricular hypertrophy. Left ventricular diastolic function could not be evaluated due to nondiagnostic images. Left ventricular diastolic function could not be evaluated. Right Ventricle: The right ventricular size is normal. No increase in right ventricular wall thickness. Right ventricular systolic function is normal. Left Atrium: Left atrial size was normal in size. Right Atrium: Right atrial size was normal in size. Pericardium: There is no evidence of pericardial effusion. Mitral Valve: Diastolic MR, more a function of heart block and bradycardia. The mitral valve is degenerative in appearance. Mild to moderate mitral annular calcification. Mild mitral valve regurgitation. No evidence of mitral valve stenosis. Tricuspid Valve: The tricuspid valve is normal in structure. Tricuspid valve regurgitation is mild . No evidence of tricuspid stenosis. Aortic Valve: The aortic valve is tricuspid. There is mild calcification of the aortic valve. Aortic valve regurgitation is mild. Aortic valve sclerosis is present, with no evidence of aortic valve stenosis. Pulmonic Valve: The pulmonic valve was normal in structure. Pulmonic valve regurgitation is not visualized. No evidence of pulmonic stenosis. Aorta: The aortic root is normal in size and  structure. Venous: The inferior vena cava is normal in size with greater than 50% respiratory variability, suggesting right atrial pressure of 3 mmHg. IAS/Shunts: No atrial level shunt detected by color flow Doppler.  LEFT VENTRICLE PLAX 2D LVIDd:         5.00 cm LVIDs:         2.60 cm LV PW:         0.90 cm LV IVS:        0.80 cm LVOT diam:     2.20 cm LVOT Area:     3.80 cm  RIGHT VENTRICLE RV Basal diam:  2.70 cm RV Mid diam:    2.70 cm RV S prime:     16.90 cm/s LEFT ATRIUM             Index        RIGHT ATRIUM           Index LA diam:        3.40 cm 1.78 cm/m   RA Area:     16.40 cm LA Vol (A2C):   52.7 ml 27.57 ml/m  RA Volume:   39.40 ml  20.61 ml/m LA Vol (A4C):   47.4 ml 24.79 ml/m LA Biplane Vol: 49.3 ml 25.79 ml/m   AORTA Ao Root diam: 3.20 cm Ao Asc diam:  3.50 cm MITRAL VALVE                TRICUSPID VALVE MV Area (PHT): 5.13 cm     TR Peak grad:   48.7 mmHg MV Decel Time: 148 msec     TR Vmax:        349.00 cm/s MV E velocity: 98.50 cm/s MV A velocity: 154.00 cm/s  SHUNTS MV E/A ratio:  0.64         Systemic Diam: 2.20 cm Morene Brownie Electronically signed by Morene Brownie Signature Date/Time: 12/16/2023/3:55:04 PM    Final (Updated)    DG Chest  Portable 1 View Result Date: 12/16/2023 CLINICAL DATA:  Shortness of breath.  Heart block. EXAM: PORTABLE CHEST 1 VIEW COMPARISON:  06/26/2021 FINDINGS: The cardiopericardial silhouette is within normal limits for size. Mild hyperexpansion. Similar appearance left parahilar scarring with fiducial markers evident. No pulmonary edema, focal airspace consolidation, or pleural effusion. Multiple pulmonary nodule seen on chest CT 12/13/2023 not readily evident by x-ray. Telemetry leads overlie the chest. IMPRESSION: 1. No acute cardiopulmonary findings. 2. Similar appearance of left parahilar scarring with fiducial markers evident. Electronically Signed   By: Camellia Candle M.D.   On: 12/16/2023 08:19   CT Super D Chest Wo Contrast Result Date:  12/16/2023 CLINICAL DATA:  Pulmonary nodule. EXAM: CT CHEST WITHOUT CONTRAST TECHNIQUE: Multidetector CT imaging of the chest was performed using thin slice collimation for electromagnetic bronchoscopy planning purposes, without intravenous contrast. RADIATION DOSE REDUCTION: This exam was performed according to the departmental dose-optimization program which includes automated exposure control, adjustment of the mA and/or kV according to patient size and/or use of iterative reconstruction technique. COMPARISON:  PET-CT 10/23/2023.  Chest CT 10/07/2023. FINDINGS: Cardiovascular: The heart size is normal. No substantial pericardial effusion. Coronary artery calcification is evident. Mitral annular calcification evident. Mild atherosclerotic calcification is noted in the wall of the thoracic aorta. Mediastinum/Nodes: No mediastinal lymphadenopathy. No evidence for gross hilar lymphadenopathy although assessment is limited by the lack of intravenous contrast on the current study. The esophagus has normal imaging features. There is no axillary lymphadenopathy. Lungs/Pleura: Centrilobular and paraseptal emphysema evident. 10 mm central right upper lobe pulmonary nodule on 61/7 was 7 mm on previous PET-CT and noted to be hypermetabolic. Anterior right upper lobe pulmonary lesion measuring 16 mm today was 12 mm on previous PET-CT and noted to be hypermetabolic. Post treatment scarring noted parahilar left lung. 3 mm left lower lobe nodule on 118/7 is stable since chest CT of 10/07/2023. Perifissural nodule in the inferior left lung on 130/7 is stable since prior chest CT. 5 mm left lower lobe nodule on 135/7 is stable. No new suspicious pulmonary nodule or mass. No focal airspace consolidation. No pleural effusion. Upper Abdomen: Visualized portion of the upper abdomen shows no acute findings. Sequelae of prior right nephrectomy. Musculoskeletal: No worrisome lytic or sclerotic osseous abnormality. IMPRESSION: 1. Interval  increase in size of the 2 right upper lobe pulmonary nodules, noted to be hypermetabolic on previous PET-CT. Imaging features are compatible with neoplasm. 2. Stable appearance of additional tiny left lung nodules. 3. Aortic Atherosclerosis (ICD10-I70.0) and Emphysema (ICD10-J43.9). Electronically Signed   By: Camellia Candle M.D.   On: 12/16/2023 08:16     Past medical hx Past Medical History:  Diagnosis Date   Allergy    Arthritis    thumbs   Cataract    Colonic adenoma 2009   ED (erectile dysfunction)    Emphysema lung (HCC) MILD    Gross hematuria    Hematospermia    History of colon polyps    History of radiation therapy 01/18/20-01/25/2020   Left Lung 3 fx; Dr. Lynwood Nasuti   History of radiation therapy 07/24/2018-07/31/2018   SBRT Left lung; Dr Lynwood Nasuti   Hyperlipemia    Hypertension    Left testicular pain    Lumbago    Lung nodules 12/2023   right upper lobe   Neoplasm of kidney    Pneumonia    had it as a child   Presence of permanent cardiac pacemaker    Renal cancer (HCC)  Renal cyst    Right testicular pain    Tobacco abuse      Social History   Tobacco Use   Smoking status: Every Day    Current packs/day: 1.00    Average packs/day: 1 pack/day for 60.8 years (60.8 ttl pk-yrs)    Types: Cigarettes    Start date: 1965    Passive exposure: Current   Smokeless tobacco: Never   Tobacco comments:    Started 60 years when he was [age] yrs old  ago admits he knows its not good MMP cma        3 quarters of a pack 01/10/2024 KRd  Substance Use Topics   Alcohol  use: Yes    Alcohol /week: 2.0 standard drinks of alcohol     Types: 2 Shots of liquor per week   Drug use: No    Mr.Ager reports that he has been smoking cigarettes. He started smoking about 60 years ago. He has a 60.8 pack-year smoking history. He has been exposed to tobacco smoke. He has never used smokeless tobacco. He reports current alcohol  use of about 2.0 standard drinks of alcohol  per  week. He reports that he does not use drugs.  Tobacco Cessation: Ready to quit: Not Answered Counseling given: Not Answered Tobacco comments: Started 60 years when he was [age] yrs old  ago admits he knows its not good MMP cma  3 quarters of a pack 01/10/2024 KRd Current every day smoker with a 61 pack year smoking hx.  Past surgical hx, Family hx, Social hx all reviewed.  Current Outpatient Medications on File Prior to Visit  Medication Sig   ALFALFA PO Take 1 tablet by mouth daily.   amLODipine (NORVASC) 5 MG tablet Take 5 mg by mouth at bedtime.   Ascorbic Acid (VITAMIN C PO) Take 1 tablet by mouth daily.   Cholecalciferol (VITAMIN D-3 PO) Take 1 tablet by mouth daily.   ezetimibe (ZETIA) 10 MG tablet Take 10 mg by mouth daily with lunch.   fexofenadine (ALLEGRA) 60 MG tablet Take 60 mg by mouth daily as needed for allergies.   Flaxseed, Linseed, (FLAX SEED OIL PO) Take 1 capsule by mouth daily.   Lecithin 1200 MG CAPS take one capsule daily Oral   PRESCRIPTION MEDICATION Place 1 application  into the right ear See admin instructions. Compounded capsule (powder) Sulfacetamide amphoteracin b + chloramphenincol + hydrocortisone. Apply to right ear every other day.   PSYLLIUM FIBER PO Take 1 capsule by mouth daily.   sodium chloride  (OCEAN) 0.65 % SOLN nasal spray Place 1 spray into the nose at bedtime.   b complex vitamins tablet Take 1 tablet by mouth daily. (Patient not taking: Reported on 01/10/2024)   No current facility-administered medications on file prior to visit.     Allergies  Allergen Reactions   Neosporin Original [Neomycin-Bacitracin Zn-Polymyx] Swelling and Rash   Rosuvastatin     Other Reaction(s): memory probs.   Zestril  [Lisinopril ] Other (See Comments)    Hx of ARF from lisinopril  use    Review Of Systems:  Constitutional:   No  weight loss, night sweats,  Fevers, chills, fatigue, or  lassitude.  HEENT:   No headaches,  Difficulty swallowing,   Tooth/dental problems, or  Sore throat,                No sneezing, itching, ear ache, nasal congestion, post nasal drip,   CV:  No chest pain,  Orthopnea, PND, swelling in lower extremities, anasarca, dizziness,  palpitations, syncope.   GI  No heartburn, indigestion, abdominal pain, nausea, vomiting, diarrhea, change in bowel habits, loss of appetite, bloody stools.   Resp: + mild  shortness of breath with exertion none at rest.  No excess mucus, no productive cough,  No non-productive cough,  No coughing up of blood.  No change in color of mucus.  No wheezing.  No chest wall deformity  Skin: no rash or lesions.  GU: no dysuria, change in color of urine, no urgency or frequency.  No flank pain, no hematuria   MS:  No joint pain or swelling.  No decreased range of motion.  No back pain.  Psych:  No change in mood or affect. No depression or anxiety.  No memory loss.   Vital Signs BP 127/83   Pulse 67   Temp (!) 97.5 F (36.4 C) (Oral)   Ht 6' (1.829 m)   Wt 156 lb 9.6 oz (71 kg)   SpO2 98%   BMI 21.24 kg/m    Physical Exam GENERAL: No distress, alert and oriented times 3. EARS NOSE THROAT: No sinus tenderness, tympanic membranes clear, pale nasal mucosa, no oral exudate, no post nasal drip, no lymphadenopathy. CHEST: No wheeze, rales, dullness, no accessory muscle use, no nasal flaring, no sternal retractions. CARDIAC: S1, S2, regular rate and rhythm, no murmur. ABDOMINAL: Soft, non tender. ND, BS present, EXTREMITIES: No clubbing, cyanosis, edema. No obvious deformities NEUROLOGICAL: Normal strength. Alert and oriented x 3, MAE x 4 SKIN: No rashes, warm and dry. No obvious skin lesions PSYCHIATRIC: Normal mood and behavior.   Assessment & Plan New diagnosis Squamous cell carcinoma of the lung, right upper lobe Stage 1A lung carcinoma treated with radiation SBRT  in 2021.  Recent scan shows two right upper lobe nodules positive for squamous cell carcinoma. - Refer to  radiation oncology for evaluation of potential radiation treatment to the new areas. - Refer to medical oncology, radiation oncology and surgery for further evaluation,  - Order pulmonary function tests to assess surgical candidacy. - Order MRI of the brain for complete staging.  Pacemaker placed for complete heart block Pacemaker implanted on December 16, 2023, by [employer]. Previous MRI indicates compatibility. - Ensure MRI compatibility with the pacemaker before proceeding with the MRI of the brain.  COPD Consider return visit after cancer treatments to discuss maintenance inhaler therapy   I spent 25 minutes dedicated to the care of this patient on the date of this encounter to include pre-visit review of records, face-to-face time with the patient discussing conditions above, post visit ordering of testing, clinical documentation with the electronic health record, making appropriate referrals as documented, and communicating necessary information to the patient's healthcare team.   Lauraine JULIANNA Lites, NP 01/10/2024  12:02 PM

## 2024-01-10 NOTE — Patient Instructions (Signed)
 It is good to see you today. I am glad you did well after your bronchoscopy with biopsies. We have reviewed the results of your biopsies. The 2 targets in the right upper lobe were both positive for a squamous cell carcinoma. This is a non-small cell lung cancer. I have referred you to radiation oncology, medical oncology, and thoracic surgery to be evaluated for treatment options. You will receive phone calls from each of these specialties to get these appointments scheduled. I have ordered an MRI of the brain as this will complete staging. I have also ordered repeat pulmonary function testing to evaluate you for surgical candidacy. We will try to get your pulmonary function testing scheduled today before you leave . Good luck with your treatment. The cancer center will take great care of you. Please call if you need to see us  for any reason at all. Please contact office for sooner follow up if symptoms do not improve or worsen or seek emergency care

## 2024-01-10 NOTE — Addendum Note (Signed)
 Addended by: Shaeley Segall F on: 01/10/2024 03:12 PM   Modules accepted: Orders

## 2024-01-13 ENCOUNTER — Ambulatory Visit: Admitting: Acute Care

## 2024-01-13 ENCOUNTER — Telehealth: Payer: Self-pay | Admitting: Acute Care

## 2024-01-13 NOTE — Telephone Encounter (Signed)
 Copied from CRM [id number]. Topic: Clinical - Request for Lab/Test Order >> Jan 10, 2024  4:50 PM Dylan Harvey wrote: Reason for CRM: Patient called and stated NP Groce wanted him to have an MRI at [facility] and since he has a pacemaker they advised  pt he needs to have it done in a hospital. Pt stated that he will be traveling on next week and will return on late Wednesday evening and would like a call Thursday morning.

## 2024-01-13 NOTE — Telephone Encounter (Signed)
 Noted, path reviewed with patient during OV 10/24

## 2024-01-13 NOTE — Telephone Encounter (Signed)
 I have sent to Taryn to schedule at the hospital.

## 2024-01-14 NOTE — Telephone Encounter (Signed)
 Patient has been scheduled already nothing further needed.

## 2024-01-16 ENCOUNTER — Other Ambulatory Visit: Payer: Self-pay

## 2024-01-16 NOTE — Progress Notes (Signed)
 The proposed treatment discussed in conference is for discussion purpose only and is not a binding recommendation.  The patients have not been physically examined, or presented with their treatment options.  Therefore, final treatment plans cannot be decided.

## 2024-01-22 ENCOUNTER — Ambulatory Visit

## 2024-01-22 DIAGNOSIS — C3411 Malignant neoplasm of upper lobe, right bronchus or lung: Secondary | ICD-10-CM | POA: Diagnosis not present

## 2024-01-22 LAB — PULMONARY FUNCTION TEST
DL/VA % pred: 49 %
DL/VA: 1.94 ml/min/mmHg/L
DLCO cor % pred: 42 %
DLCO cor: 11.26 ml/min/mmHg
DLCO unc % pred: 41 %
DLCO unc: 10.9 ml/min/mmHg
FEF 25-75 Post: 1.11 L/s
FEF 25-75 Pre: 0.96 L/s
FEF2575-%Change-Post: 15 %
FEF2575-%Pred-Post: 46 %
FEF2575-%Pred-Pre: 39 %
FEV1-%Change-Post: 3 %
FEV1-%Pred-Post: 66 %
FEV1-%Pred-Pre: 63 %
FEV1-Post: 2.18 L
FEV1-Pre: 2.1 L
FEV1FVC-%Change-Post: 1 %
FEV1FVC-%Pred-Pre: 82 %
FEV6-%Change-Post: 3 %
FEV6-%Pred-Post: 82 %
FEV6-%Pred-Pre: 79 %
FEV6-Post: 3.53 L
FEV6-Pre: 3.4 L
FEV6FVC-%Change-Post: 1 %
FEV6FVC-%Pred-Post: 104 %
FEV6FVC-%Pred-Pre: 103 %
FVC-%Change-Post: 2 %
FVC-%Pred-Post: 79 %
FVC-%Pred-Pre: 77 %
FVC-Post: 3.58 L
FVC-Pre: 3.49 L
Post FEV1/FVC ratio: 61 %
Post FEV6/FVC ratio: 98 %
Pre FEV1/FVC ratio: 60 %
Pre FEV6/FVC Ratio: 97 %
RV % pred: 125 %
RV: 3.27 L
TLC % pred: 98 %
TLC: 7.2 L

## 2024-01-22 NOTE — Progress Notes (Signed)
 Full pft performed today

## 2024-01-22 NOTE — Patient Instructions (Signed)
 Full pft performed today

## 2024-01-23 ENCOUNTER — Other Ambulatory Visit: Payer: Self-pay | Admitting: *Deleted

## 2024-01-23 DIAGNOSIS — C3432 Malignant neoplasm of lower lobe, left bronchus or lung: Secondary | ICD-10-CM

## 2024-01-24 ENCOUNTER — Ambulatory Visit

## 2024-01-24 ENCOUNTER — Telehealth: Payer: Self-pay

## 2024-01-24 VITALS — BP 130/74 | HR 71 | Temp 98.1°F | Ht 72.0 in | Wt 157.2 lb

## 2024-01-24 DIAGNOSIS — J4489 Other specified chronic obstructive pulmonary disease: Secondary | ICD-10-CM

## 2024-01-24 DIAGNOSIS — J439 Emphysema, unspecified: Secondary | ICD-10-CM

## 2024-01-24 DIAGNOSIS — F17210 Nicotine dependence, cigarettes, uncomplicated: Secondary | ICD-10-CM | POA: Diagnosis not present

## 2024-01-24 DIAGNOSIS — C3491 Malignant neoplasm of unspecified part of right bronchus or lung: Secondary | ICD-10-CM | POA: Diagnosis not present

## 2024-01-24 MED ORDER — UMECLIDINIUM-VILANTEROL 62.5-25 MCG/ACT IN AEPB
1.0000 | INHALATION_SPRAY | Freq: Every day | RESPIRATORY_TRACT | 5 refills | Status: DC
Start: 2024-01-24 — End: 2024-02-07

## 2024-01-24 NOTE — Telephone Encounter (Signed)
 Hey Dr. Zaida, We noticed that Dylan Harvey has an MR Brain w/wo Contrast scheduled for 02/14/24 at [facility], but you also placed an MR Brain wo Contrast order marked as next available. Would you like us  to cancel Dylan Slain scheduled order and proceed with yours instead? Appreciate your help thanks!

## 2024-01-24 NOTE — Progress Notes (Signed)
 Subjective:   PATIENT ID: Dylan Harvey GENDER: male DOB: Feb 14, 1951, MRN: [id number]   HPI Discussed the use of AI scribe software for clinical note transcription with the patient, who gave verbal consent to proceed.  History of Present Illness Dylan Harvey is a 73 year old male with COPD and heart block who presents for follow-up on squamous cell cancer diagnosis and treatment planning.  He has been diagnosed with squamous cell cancer following the discovery of nodules on a CT scan. A brain MRI is scheduled as a precautionary measure to ensure the cancer has not metastasized to the brain. He prefers to have this done at [facility] rather than [place] due to previous scheduling errors.  He has a history of COPD, supported by PFTs showing moderate obstruction. He experiences occasional coughing and breathing difficulties. He has not been using an inhaler.  He has a pacemaker, which has significantly improved his energy levels and lowered his blood pressure. He also has a history of having only one kidney, which does not affect his current treatment plan.  He is scheduled for a prostate biopsy on December 8th and has a device check for his pacemaker scheduled remotely.     Past Medical History:  Diagnosis Date   Allergy    Arthritis    thumbs   Cataract    Colonic adenoma 2009   ED (erectile dysfunction)    Emphysema lung (HCC) MILD    Gross hematuria    Hematospermia    History of colon polyps    History of radiation therapy 01/18/20-01/25/2020   Left Lung 3 fx; Dr. Lynwood Nasuti   History of radiation therapy 07/24/2018-07/31/2018   SBRT Left lung; Dr Lynwood Nasuti   Hyperlipemia    Hypertension    Left testicular pain    Lumbago    Lung nodules 12/2023   right upper lobe   Neoplasm of kidney    Pneumonia    had it as a child   Presence of permanent cardiac pacemaker    Renal cancer [facility])    Renal cyst    Right testicular pain    Tobacco abuse       Family History  Problem Relation Age of Onset   Alzheimer's disease Mother    Hypothyroidism Mother    Severe combined immunodeficiency Mother    Lung cancer Father    Prostate cancer Father    Colon cancer Neg Hx    Colon polyps Neg Hx    Esophageal cancer Neg Hx    Rectal cancer Neg Hx    Stomach cancer Neg Hx      Social History   Socioeconomic History   Marital status: Married    Spouse name: Not on file   Number of children: Not on file   Years of education: Not on file   Highest education level: Not on file  Occupational History   Not on file  Tobacco Use   Smoking status: Every Day    Current packs/day: 1.00    Average packs/day: 1 pack/day for 60.8 years (60.8 ttl pk-yrs)    Types: Cigarettes    Start date: 1965    Passive exposure: Current   Smokeless tobacco: Never   Tobacco comments:    Started 60 years when he was [age] yrs old  ago admits he knows its not good MMP cma        3 quarters of a pack 01/10/2024 KRd  Substance and Sexual Activity  Alcohol  use: Yes    Alcohol /week: 2.0 standard drinks of alcohol     Types: 2 Shots of liquor per week   Drug use: No   Sexual activity: Not on file  Other Topics Concern   Not on file  Social History Narrative   Married 1980 no chilldren. Went to college   Occupation is Preservationist @ [employer]   Social Drivers of Corporate Investment Banker Strain: Not on file  Food Insecurity: No Food Insecurity (12/17/2023)   Hunger Vital Sign    Worried About Running Out of Food in the Last Year: Never true    Ran Out of Food in the Last Year: Never true  Transportation Needs: No Transportation Needs (07/15/2023)   Received from [facility]    In the past 12 months, has lack of reliable transportation kept you from medical appointments, meetings, work or from getting things needed for daily living? : No  Physical Activity: Not on file  Stress: Not on file  Social  Connections: Not on file  Intimate Partner Violence: Not on file     Allergies  Allergen Reactions   Neosporin Original [Neomycin-Bacitracin Zn-Polymyx] Swelling and Rash   Rosuvastatin     Other Reaction(s): memory probs.   Zestril  [Lisinopril ] Other (See Comments)    Hx of ARF from lisinopril  use     Outpatient Medications Prior to Visit  Medication Sig Dispense Refill   ALFALFA PO Take 1 tablet by mouth daily.     amLODipine (NORVASC) 5 MG tablet Take 5 mg by mouth at bedtime.     Ascorbic Acid (VITAMIN C PO) Take 1 tablet by mouth daily.     b complex vitamins tablet Take 1 tablet by mouth daily.     Cholecalciferol (VITAMIN D-3 PO) Take 1 tablet by mouth daily.     ezetimibe (ZETIA) 10 MG tablet Take 10 mg by mouth daily with lunch.     fexofenadine (ALLEGRA) 60 MG tablet Take 60 mg by mouth daily as needed for allergies.     Flaxseed, Linseed, (FLAX SEED OIL PO) Take 1 capsule by mouth daily.     Lecithin 1200 MG CAPS take one capsule daily Oral     PRESCRIPTION MEDICATION Place 1 application  into the right ear See admin instructions. Compounded capsule (powder) Sulfacetamide amphoteracin b + chloramphenincol + hydrocortisone. Apply to right ear every other day.     PSYLLIUM FIBER PO Take 1 capsule by mouth daily.     sodium chloride  (OCEAN) 0.65 % SOLN nasal spray Place 1 spray into the nose at bedtime.     No facility-administered medications prior to visit.    ROS Reviewed all systems and reported negative except as above     Objective:   Vitals:   01/24/24 1133  BP: 130/74  Pulse: 71  Temp: 98.1 F (36.7 C)  TempSrc: Oral  SpO2: 98%  Weight: 157 lb 3.2 oz (71.3 kg)  Height: 6' (1.829 m)    Physical Exam Physical Exam GENERAL: Appropriate to age, no acute distress. HEAD EYES EARS NOSE THROAT: Moist mucous membranes, atraumatic, normocephalic. CHEST: Clear to auscultation bilaterally, no wheezing, no crackles, no rales CARDIAC: Regular rate and rhythm,  normal S1, normal S2, no murmurs, no rubs, no gallops. ABDOMEN: Soft, nontender. NEUROLOGICAL: Motor and sensation grossly intact, alert and oriented times X 3. EXTREMITIES: Warm, well perfused, no edema.     CBC    Component Value Date/Time  WBC 7.4 12/16/2023 0606   RBC 4.16 (L) 12/16/2023 0606   HGB 13.5 12/16/2023 0606   HCT 39.0 12/16/2023 0606   PLT 178 12/16/2023 0606   MCV 93.8 12/16/2023 0606   MCH 32.5 12/16/2023 0606   MCHC 34.6 12/16/2023 0606   RDW 14.1 12/16/2023 0606   LYMPHSABS 1.9 03/19/2012 0354   MONOABS 1.0 03/19/2012 0354   EOSABS 0.1 03/19/2012 0354   BASOSABS 0.0 03/19/2012 0354      PFT:    Latest Ref Rng & Units 01/22/2024    2:38 PM 05/26/2018    9:53 AM  PFT Results  FVC-Pre L 3.49  4.06   FVC-Predicted Pre % 77  85   FVC-Post L 3.58  4.23   FVC-Predicted Post % 79  88   Pre FEV1/FVC % % 60  58   Post FEV1/FCV % % 61  59   FEV1-Pre L 2.10  2.35   FEV1-Predicted Pre % 63  66   FEV1-Post L 2.18  2.49   DLCO uncorrected ml/min/mmHg 10.90  15.63   DLCO UNC% % 41  56   DLCO corrected ml/min/mmHg 11.26    DLCO COR %Predicted % 42    DLVA Predicted % 49  64   TLC L 7.20  7.38   TLC % Predicted % 98  100   RV % Predicted % 125  130          Assessment & Plan:   Assessment and Plan Assessment & Plan Squamous cell carcinoma of right lung Biopsy confirmed squamous cell carcinoma. Tumor board recommended radiation due to moderate obstruction and reduced DLCO. PET scan negative for metastasis. Brain MRI needed for staging. - Ordered brain MRI at [facility] for staging.   Chronic obstructive pulmonary disease (COPD) with moderate obstruction PFTs show moderate obstruction with DLCO at 40%, indicating moderate to severe impairment. Inhaler therapy advised to manage symptoms and support breathing during radiation therapy. - Prescribed Anoro inhaler, sent prescription to [facility]. - Monitor for side effects: tachycardia, urinary retention,  ocular effects.         Zola Herter, MD [facility] Office: [phone]

## 2024-01-24 NOTE — Patient Instructions (Addendum)
  VISIT SUMMARY: You came in today for a follow-up on your squamous cell cancer diagnosis and to discuss your treatment plan. We also reviewed your COPD management and your upcoming medical appointments.  YOUR PLAN: -SQUAMOUS CELL CARCINOMA OF RIGHT LUNG: Squamous cell carcinoma is a type of lung cancer that starts in the squamous cells of the lung. Your biopsy confirmed this diagnosis, and the tumor board has recommended radiation therapy due to your moderate lung obstruction. A PET scan showed no signs of the cancer spreading, but a brain MRI is needed to ensure it hasn't spread to your brain. Please attend your oncologist appointment on November 10th at 11:15 AM and your radiation oncologist appointment on November 12th at 12:00 PM. We have also ordered a brain MRI at [facility].  -CHRONIC OBSTRUCTIVE PULMONARY DISEASE (COPD) WITH MODERATE OBSTRUCTION: COPD is a chronic lung disease that makes it hard to breathe. Your pulmonary function tests show moderate obstruction, and your DLCO is at 40%, indicating moderate to severe impairment. To help manage your symptoms and support your breathing during radiation therapy, we have prescribed an Anoro inhaler. Please pick up your prescription at [facility] and monitor for side effects such as increased heart rate, difficulty urinating, and eye problems. If you have glaucoma, ensure you have routine eye check-ups.  INSTRUCTIONS: Please attend your oncologist appointment on November 10th at 11:15 AM and your radiation oncologist appointment on November 12th at 12:00 PM. We have also ordered a brain MRI at [facility] for staging.

## 2024-01-27 ENCOUNTER — Inpatient Hospital Stay: Attending: Internal Medicine | Admitting: Internal Medicine

## 2024-01-27 ENCOUNTER — Inpatient Hospital Stay

## 2024-01-27 VITALS — BP 143/85 | HR 92 | Temp 98.0°F | Resp 17 | Ht 72.0 in | Wt 158.0 lb

## 2024-01-27 DIAGNOSIS — Z8042 Family history of malignant neoplasm of prostate: Secondary | ICD-10-CM | POA: Diagnosis not present

## 2024-01-27 DIAGNOSIS — Z801 Family history of malignant neoplasm of trachea, bronchus and lung: Secondary | ICD-10-CM | POA: Insufficient documentation

## 2024-01-27 DIAGNOSIS — Z8551 Personal history of malignant neoplasm of bladder: Secondary | ICD-10-CM | POA: Insufficient documentation

## 2024-01-27 DIAGNOSIS — Z923 Personal history of irradiation: Secondary | ICD-10-CM | POA: Insufficient documentation

## 2024-01-27 DIAGNOSIS — F17210 Nicotine dependence, cigarettes, uncomplicated: Secondary | ICD-10-CM | POA: Diagnosis not present

## 2024-01-27 DIAGNOSIS — C3490 Malignant neoplasm of unspecified part of unspecified bronchus or lung: Secondary | ICD-10-CM

## 2024-01-27 DIAGNOSIS — Z8546 Personal history of malignant neoplasm of prostate: Secondary | ICD-10-CM | POA: Insufficient documentation

## 2024-01-27 DIAGNOSIS — Z905 Acquired absence of kidney: Secondary | ICD-10-CM

## 2024-01-27 DIAGNOSIS — C3432 Malignant neoplasm of lower lobe, left bronchus or lung: Secondary | ICD-10-CM

## 2024-01-27 DIAGNOSIS — C3411 Malignant neoplasm of upper lobe, right bronchus or lung: Secondary | ICD-10-CM | POA: Insufficient documentation

## 2024-01-27 LAB — CMP (CANCER CENTER ONLY)
ALT: 16 U/L (ref 0–44)
AST: 21 U/L (ref 15–41)
Albumin: 4.5 g/dL (ref 3.5–5.0)
Alkaline Phosphatase: 70 U/L (ref 38–126)
Anion gap: 8 (ref 5–15)
BUN: 19 mg/dL (ref 8–23)
CO2: 26 mmol/L (ref 22–32)
Calcium: 9.4 mg/dL (ref 8.9–10.3)
Chloride: 101 mmol/L (ref 98–111)
Creatinine: 1.49 mg/dL — ABNORMAL HIGH (ref 0.61–1.24)
GFR, Estimated: 49 mL/min — ABNORMAL LOW (ref >60)
Glucose, Bld: 120 mg/dL — ABNORMAL HIGH (ref 70–99)
Potassium: 4.2 mmol/L (ref 3.5–5.1)
Sodium: 135 mmol/L (ref 135–145)
Total Bilirubin: 0.5 mg/dL (ref 0.0–1.2)
Total Protein: 7.5 g/dL (ref 6.5–8.1)

## 2024-01-27 LAB — CBC WITH DIFFERENTIAL (CANCER CENTER ONLY)
Abs Immature Granulocytes: 0.02 K/uL (ref 0.00–0.07)
Basophils Absolute: 0 K/uL (ref 0.0–0.1)
Basophils Relative: 0 %
Eosinophils Absolute: 0.3 K/uL (ref 0.0–0.5)
Eosinophils Relative: 4 %
HCT: 39.7 % (ref 39.0–52.0)
Hemoglobin: 14 g/dL (ref 13.0–17.0)
Immature Granulocytes: 0 %
Lymphocytes Relative: 18 %
Lymphs Abs: 1.2 K/uL (ref 0.7–4.0)
MCH: 32.7 pg (ref 26.0–34.0)
MCHC: 35.3 g/dL (ref 30.0–36.0)
MCV: 92.8 fL (ref 80.0–100.0)
Monocytes Absolute: 0.8 K/uL (ref 0.1–1.0)
Monocytes Relative: 11 %
Neutro Abs: 4.8 K/uL (ref 1.7–7.7)
Neutrophils Relative %: 67 %
Platelet Count: 176 K/uL (ref 150–400)
RBC: 4.28 MIL/uL (ref 4.22–5.81)
RDW: 13.8 % (ref 11.5–15.5)
WBC Count: 7.1 K/uL (ref 4.0–10.5)
nRBC: 0 % (ref 0.0–0.2)

## 2024-01-27 NOTE — Progress Notes (Signed)
 [facility] [phone]   [phone]  CONSULT NOTE  REFERRING PHYSICIAN: Dr. Dorn Chill  REASON FOR CONSULTATION:  73 years old white male diagnosed with lung cancer  HPI Dylan Harvey is a 73 y.o. male.   HPI  Discussed the use of AI scribe software for clinical note transcription with the patient, who gave verbal consent to proceed.  History of Present Illness Dylan Harvey is a 73 year old male with a history of lung cancer who presents for evaluation of new lung nodules. He is accompanied by his wife, Dylan Harvey.  In March 2020, he was diagnosed with lung cancer on the left side and underwent radiation treatment. He was monitored with regular scans, and recently, new nodules were detected on the right upper lobe of his lung. A PET scan on October 23, 2023, showed these nodules to be hypermetabolic. A bronchoscopy performed on January 02, 2024, was done after the PET scan and the results showed squamous cell carcinoma.  In 2022, he underwent surgery for his right kidney, which was removed due to urothelial carcinoma. He did not receive any additional treatment post-surgery.  No current symptoms such as chest pain, breathing issues, nausea, vomiting, diarrhea, or headaches. He has a history of high blood pressure, high cholesterol, allergies, and arthritis, which he describes as 'not too bad'. No history of diabetes, heart attacks, or strokes.  His family history is significant for his mother having Alzheimer's disease and his father having lung and prostate cancer. He has no siblings with cancer.  Socially, he is retired, having worked in psychiatric nurse. He has a long history of smoking, currently smoking less than a pack a day, and has been smoking for approximately 60 years. He consumes two alcoholic drinks per week, typically on Saturday nights, and denies any drug use.  He is allergic to Neosporin, which causes a rash.      Past Medical History:  Diagnosis Date   Allergy    Arthritis    thumbs   Cataract    Colonic adenoma 2009   ED (erectile dysfunction)    Emphysema lung (HCC) MILD    Gross hematuria    Hematospermia    History of colon polyps    History of radiation therapy 01/18/20-01/25/2020   Left Lung 3 fx; Dr. Lynwood Nasuti   History of radiation therapy 07/24/2018-07/31/2018   SBRT Left lung; Dr Lynwood Nasuti   Hyperlipemia    Hypertension    Left testicular pain    Lumbago    Lung nodules 12/2023   right upper lobe   Neoplasm of kidney    Pneumonia    had it as a child   Presence of permanent cardiac pacemaker    Renal cancer [facility])    Renal cyst    Right testicular pain    Tobacco abuse       Past Surgical History:  Procedure Laterality Date   COLONOSCOPY     ENDOBRONCHIAL ULTRASOUND N/A 01/02/2024   Procedure: ENDOBRONCHIAL ULTRASOUND (EBUS);  Surgeon: Chill Dorn NOVAK, MD;  Location: [facility] ENDOSCOPY;  Service: Pulmonary;  Laterality: N/A;   FUDUCIAL PLACEMENT N/A 06/11/2018   Procedure: PLACEMENT OF FUDUCIAL;  Surgeon: Kerrin Elspeth BROCKS, MD;  Location: [facility] OR;  Service: Thoracic;  Laterality: N/A;   HERNIA REPAIR     inguinal   INNER EAR SURGERY     PACEMAKER IMPLANT N/A 12/16/2023   Procedure: PACEMAKER IMPLANT;  Surgeon: Kennyth Chew, MD;  Location: [facility] INVASIVE CV LAB;  Service: Cardiovascular;  Laterality: N/A;   POLYPECTOMY     ROBOT ASSITED LAPAROSCOPIC NEPHROURETERECTOMY Right 09/07/2020   Procedure: XI ROBOT ASSITED LAPAROSCOPIC NEPHROURETERECTOMY/ CYSTOSCOPY/ STENT PLACEMENT/ TRANSURETHRAL RESECTION OF URETERAL ORIFICE;  Surgeon: Devere Lonni Righter, MD;  Location: [facility] ORS;  Service: Urology;  Laterality: Right;   VIDEO BRONCHOSCOPY WITH ENDOBRONCHIAL NAVIGATION N/A 06/11/2018   Procedure: VIDEO BRONCHOSCOPY WITH ENDOBRONCHIAL NAVIGATION;  Surgeon: Kerrin Elspeth BROCKS, MD;  Location: [facility] OR;  Service: Thoracic;  Laterality: N/A;   VIDEO BRONCHOSCOPY WITH  ENDOBRONCHIAL NAVIGATION N/A 01/02/2024   Procedure: VIDEO BRONCHOSCOPY WITH ENDOBRONCHIAL NAVIGATION;  Surgeon: Kara Dorn NOVAK, MD;  Location: [facility] ENDOSCOPY;  Service: Pulmonary;  Laterality: N/A;   WISDOM TOOTH EXTRACTION      Family History  Problem Relation Age of Onset   Alzheimer's disease Mother    Hypothyroidism Mother    Severe combined immunodeficiency Mother    Lung cancer Father    Prostate cancer Father    Colon cancer Neg Hx    Colon polyps Neg Hx    Esophageal cancer Neg Hx    Rectal cancer Neg Hx    Stomach cancer Neg Hx     Social History Social History   Tobacco Use   Smoking status: Every Day    Current packs/day: 1.00    Average packs/day: 1 pack/day for 60.9 years (60.9 ttl pk-yrs)    Types: Cigarettes    Start date: 1965    Passive exposure: Current   Smokeless tobacco: Never   Tobacco comments:    Started 60 years when he was [age] yrs old  ago admits he knows its not good MMP cma        3 quarters of a pack 01/10/2024 KRd  Substance Use Topics   Alcohol  use: Yes    Alcohol /week: 2.0 standard drinks of alcohol     Types: 2 Shots of liquor per week   Drug use: No    Allergies  Allergen Reactions   Neosporin Original [Neomycin-Bacitracin Zn-Polymyx] Swelling and Rash   Rosuvastatin     Other Reaction(s): memory probs.   Zestril  [Lisinopril ] Other (See Comments)    Hx of ARF from lisinopril  use    Current Outpatient Medications  Medication Sig Dispense Refill   ALFALFA PO Take 1 tablet by mouth daily.     amLODipine (NORVASC) 5 MG tablet Take 5 mg by mouth at bedtime.     Ascorbic Acid (VITAMIN C PO) Take 1 tablet by mouth daily.     b complex vitamins tablet Take 1 tablet by mouth daily.     Cholecalciferol (VITAMIN D-3 PO) Take 1 tablet by mouth daily.     ezetimibe (ZETIA) 10 MG tablet Take 10 mg by mouth daily with lunch.     fexofenadine (ALLEGRA) 60 MG tablet Take 60 mg by mouth daily as needed for allergies.     Flaxseed,  Linseed, (FLAX SEED OIL PO) Take 1 capsule by mouth daily.     Lecithin 1200 MG CAPS take one capsule daily Oral     PRESCRIPTION MEDICATION Place 1 application  into the right ear See admin instructions. Compounded capsule (powder) Sulfacetamide amphoteracin b + chloramphenincol + hydrocortisone. Apply to right ear every other day.     PSYLLIUM FIBER PO Take 1 capsule by mouth daily.     sodium chloride  (OCEAN) 0.65 % SOLN nasal spray Place 1 spray into the nose at bedtime.     umeclidinium-vilanterol (ANORO ELLIPTA)  62.5-25 MCG/ACT AEPB Inhale 1 puff into the lungs daily. 60 each 5   No current facility-administered medications for this visit.    Review of Systems  Constitutional: positive for fatigue Eyes: negative Ears, nose, mouth, throat, and face: negative Respiratory: positive for cough and dyspnea on exertion Cardiovascular: negative Gastrointestinal: negative Genitourinary:negative Integument/breast: negative Hematologic/lymphatic: negative Musculoskeletal:negative Neurological: negative Behavioral/Psych: negative Endocrine: negative Allergic/Immunologic: negative  Physical Exam  MJO:jozmu, healthy, no distress, well nourished, and well developed SKIN: skin color, texture, turgor are normal, no rashes or significant lesions HEAD: Normocephalic, No masses, lesions, tenderness or abnormalities EYES: normal, PERRLA, Conjunctiva are pink and non-injected EARS: External ears normal, Canals clear OROPHARYNX:no exudate, no erythema, and lips, buccal mucosa, and tongue normal  NECK: supple, no adenopathy, no JVD LYMPH:  no palpable lymphadenopathy, no hepatosplenomegaly LUNGS: clear to auscultation , and palpation HEART: regular rate & rhythm, no murmurs, and no gallops ABDOMEN:abdomen soft, non-tender, normal bowel sounds, and no masses or organomegaly BACK: Back symmetric, no curvature., No CVA tenderness EXTREMITIES:no joint deformities, effusion, or inflammation, no  edema  NEURO: alert & oriented x 3 with fluent speech, no focal motor/sensory deficits  PERFORMANCE STATUS: ECOG 1  LABORATORY DATA: Lab Results  Component Value Date   WBC 7.1 01/27/2024   HGB 14.0 01/27/2024   HCT 39.7 01/27/2024   MCV 92.8 01/27/2024   PLT 176 01/27/2024      Chemistry      Component Value Date/Time   NA 132 (L) 12/16/2023 0606   K 3.8 12/16/2023 0606   CL 102 12/16/2023 0606   CO2 23 12/16/2023 0606   BUN 20 12/16/2023 0606   CREATININE 1.43 (H) 12/16/2023 0606      Component Value Date/Time   CALCIUM 8.8 (L) 12/16/2023 0606   ALKPHOS 66 09/05/2020 1346   AST 21 09/05/2020 1346   ALT 15 09/05/2020 1346   BILITOT 0.6 09/05/2020 1346       RADIOGRAPHIC STUDIES: DG Chest Port 1 View Result Date: 01/02/2024 CLINICAL DATA:  Status post biopsy. EXAM: PORTABLE CHEST 1 VIEW COMPARISON:  Chest radiograph dated 12/17/2023. FINDINGS: Similar appearance of reticulonodular density in the left mid lung field with associated biopsy clips. No new consolidation. There is no pleural effusion or pneumothorax. The cardiac silhouette is within normal limits. Left pectoral pacemaker device. No acute osseous pathology. IMPRESSION: 1. No pneumothorax. 2. Similar appearance of reticulonodular density in the left mid lung field. Electronically Signed   By: Vanetta Chou M.D.   On: 01/02/2024 14:25   DG C-ARM BRONCHOSCOPY Result Date: 01/02/2024 C-ARM BRONCHOSCOPY: Fluoroscopy was utilized by the requesting physician.  No radiographic interpretation.   DG C-Arm 1-60 Min-No Report Result Date: 01/02/2024 Fluoroscopy was utilized by the requesting physician.  No radiographic interpretation.    ASSESSMENT: This is a very pleasant 73 years old white male with a stage IIb (T3, N0, M0) non-small cell lung cancer, squamous cell carcinoma diagnosed in October 2025 and presented with 2 right upper lobe pulmonary nodules.  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 Additional history include history of urothelial carcinoma status post right nephrectomy and recent diagnosis of prostate cancer followed by Dr. Devere.   PLAN: I had a lengthy discussion with the patient and his wife today about his current disease stage, prognosis and treatment options. I personally independently reviewed the imaging studies as well as the pathology report and discussed  the result with the patient. Assessment and Plan Assessment & Plan Non-small cell lung cancer, right upper lobe, stage IIB (squamous cell) Stage IIB non-small cell lung cancer, specifically squamous cell carcinoma, located in the right upper lobe with two hypermetabolic nodules identified on PET scan. Previous left lower lobe lung cancer treated with radiation. Current nodules are not amenable to surgical resection due to anatomical location and previous treatment approach. Radiation therapy is considered the primary treatment option with the goal of cure. No current indication for chemotherapy, targeted therapy, or immunotherapy. - Will proceed with stereotactic radiation therapy for the right upper lobe nodules. - Will schedule follow-up scan in four months to assess response to radiation therapy. - Continue with brain MRI to monitor for metastasis.  Referral for genetic counseling due to multiple primary malignancies Multiple primary malignancies including lung cancer, urothelial carcinoma, and prostate cancer. No prior genetic counseling. Referral to genetic counseling is considered to evaluate potential hereditary cancer syndromes and preventive measures. - Referred to genetic counseling for evaluation of hereditary cancer syndromes. He was advised to call immediately if he has any concerning symptoms in the interval.   The patient voices understanding of current disease status and treatment options and is in agreement with the  current care plan.  All questions were answered. The patient knows to call the clinic with any problems, questions or concerns. We can certainly see the patient much sooner if necessary.  Thank you so much for allowing me to participate in the care of Dylan Harvey. I will continue to follow up the patient with you and assist in his care.  The total time spent in the appointment was 60 minutes including review of chart and various tests results, discussions about plan of care and coordination of care plan .   Disclaimer: This note was dictated with voice recognition software. Similar sounding words can inadvertently be transcribed and may not be corrected upon review.   Dylan Harvey Dylan January 27, 2024, 11:35 AM

## 2024-01-27 NOTE — Telephone Encounter (Signed)
 Patient is aware and they have been rescheduled. NFN

## 2024-01-28 ENCOUNTER — Encounter: Admitting: Thoracic Surgery (Cardiothoracic Vascular Surgery)

## 2024-01-28 ENCOUNTER — Telehealth: Payer: Self-pay | Admitting: Internal Medicine

## 2024-01-28 NOTE — Progress Notes (Incomplete)
 Radiation Oncology         [phone] ________________________________  Initial Outpatient Consultation  Name: Dylan Harvey MRN: [id number]  Date: 01/29/2024  DOB: Sep 18, 1950  RR:Ezmpwp, Oneil, MD  Hattar, Zola SAILOR, MD   REFERRING PHYSICIAN: Zaida Zola SAILOR, MD  DIAGNOSIS: There were no encounter diagnoses.  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 male who is accompanied by ***. he is seen as a courtesy of Dr. Zaida for an opinion concerning radiation therapy as part of management for his recurrent lung cancer. who is known to me for his history of radiation for lung cancer and was last seen in office on 10/14/23 by PA Wyatt for a follow up visit.    In the interval since he was last seen, he continued to follow up with his oncology and pulmonology specialists. He underwent a surveillance CT scan on 10/10/23 which showed an increase in the size of his right upper lobe nodule now measuring 11 x 7 mm. He then underwent a PET/CT performed on 11/01/23 which showed PET avid enlarging right upper lobe nodule as well as a 7 mm hypermetabolic nodule in the medial aspect of the right upper lobe. There was no evidence of recurrent disease on the left side or evidence of mediastinal or hilar lymph node involvement.    Most resect chest CT done on 12/13/23 showed an interval increase of 2 right upper lobe pulmonary nodules, a 10 mm central right upper lobe pulmonary nodule on 61/7 was 7 mm on previous PET-CT and noted to be hypermetabolic and the anterior right upper lobe pulmonary lesion measuring 16 mm today was 12 mm on previous PET-CT and noted to be hypermetabolic.   In light of findings, he then underwent a video bronchoscopy with endobronchial navigation on 01/02/24 under the care of Dr. Kara. Surgical pathology of FNA and brushing of both  nodules indicated squamous cell carcinoma    A brain MRI is scheduled on 03/04/24 for staging purposes.    Of note: he underwent a pacemaker placed on 12/16/23.   PREVIOUS RADIATION THERAPY: Yes *** Radiation Treatment Dates: 01/18/2020 through 01/25/2020 (new site) Site Technique Total Dose (Gy) Dose per Fx (Gy) Completed Fx Beam Energies  Lung, Left: Lung_Lt IMRT 54/54 18 3/3 6XFFF   PAST MEDICAL HISTORY:  Past Medical History:  Diagnosis Date   Allergy    Arthritis    thumbs   Cataract    Colonic adenoma 2009   ED (erectile dysfunction)    Emphysema lung (HCC) MILD    Gross hematuria    Hematospermia    History of colon polyps    History of radiation therapy 01/18/20-01/25/2020   Left Lung 3 fx; Dr. Lynwood Nasuti   History of radiation therapy 07/24/2018-07/31/2018   SBRT Left lung; Dr Lynwood Nasuti   Hyperlipemia    Hypertension    Left testicular pain    Lumbago    Lung nodules 12/2023   right upper lobe   Neoplasm of kidney    Pneumonia    had it as a child   Presence of permanent cardiac pacemaker    Renal cancer [facility])    Renal cyst    Right testicular pain    Tobacco abuse     PAST SURGICAL HISTORY: Past Surgical History:  Procedure Laterality Date   COLONOSCOPY  ENDOBRONCHIAL ULTRASOUND N/A 01/02/2024   Procedure: ENDOBRONCHIAL ULTRASOUND (EBUS);  Surgeon: Kara Dorn NOVAK, MD;  Location: [facility] ENDOSCOPY;  Service: Pulmonary;  Laterality: N/A;   FUDUCIAL PLACEMENT N/A 06/11/2018   Procedure: PLACEMENT OF FUDUCIAL;  Surgeon: Kerrin Elspeth BROCKS, MD;  Location: [facility] OR;  Service: Thoracic;  Laterality: N/A;   HERNIA REPAIR     inguinal   INNER EAR SURGERY     PACEMAKER IMPLANT N/A 12/16/2023   Procedure: PACEMAKER IMPLANT;  Surgeon: Kennyth Chew, MD;  Location: [facility] INVASIVE CV LAB;  Service: Cardiovascular;  Laterality: N/A;   POLYPECTOMY     ROBOT ASSITED LAPAROSCOPIC NEPHROURETERECTOMY Right 09/07/2020   Procedure: XI ROBOT ASSITED LAPAROSCOPIC  NEPHROURETERECTOMY/ CYSTOSCOPY/ STENT PLACEMENT/ TRANSURETHRAL RESECTION OF URETERAL ORIFICE;  Surgeon: Devere Lonni Righter, MD;  Location: [facility] ORS;  Service: Urology;  Laterality: Right;   VIDEO BRONCHOSCOPY WITH ENDOBRONCHIAL NAVIGATION N/A 06/11/2018   Procedure: VIDEO BRONCHOSCOPY WITH ENDOBRONCHIAL NAVIGATION;  Surgeon: Kerrin Elspeth BROCKS, MD;  Location: [facility] OR;  Service: Thoracic;  Laterality: N/A;   VIDEO BRONCHOSCOPY WITH ENDOBRONCHIAL NAVIGATION N/A 01/02/2024   Procedure: VIDEO BRONCHOSCOPY WITH ENDOBRONCHIAL NAVIGATION;  Surgeon: Kara Dorn NOVAK, MD;  Location: [facility] ENDOSCOPY;  Service: Pulmonary;  Laterality: N/A;   WISDOM TOOTH EXTRACTION      FAMILY HISTORY:  Family History  Problem Relation Age of Onset   Alzheimer's disease Mother    Hypothyroidism Mother    Severe combined immunodeficiency Mother    Lung cancer Father    Prostate cancer Father    Colon cancer Neg Hx    Colon polyps Neg Hx    Esophageal cancer Neg Hx    Rectal cancer Neg Hx    Stomach cancer Neg Hx     SOCIAL HISTORY:  Social History   Tobacco Use   Smoking status: Every Day    Current packs/day: 1.00    Average packs/day: 1 pack/day for 60.9 years (60.9 ttl pk-yrs)    Types: Cigarettes    Start date: 1965    Passive exposure: Current   Smokeless tobacco: Never   Tobacco comments:    Started 60 years when he was [age] yrs old  ago admits he knows its not good MMP cma        3 quarters of a pack 01/10/2024 KRd  Substance Use Topics   Alcohol  use: Yes    Alcohol /week: 2.0 standard drinks of alcohol     Types: 2 Shots of liquor per week   Drug use: No    ALLERGIES:  Allergies  Allergen Reactions   Neosporin Original [Neomycin-Bacitracin Zn-Polymyx] Swelling and Rash   Rosuvastatin     Other Reaction(s): memory probs.   Zestril  [Lisinopril ] Other (See Comments)    Hx of ARF from lisinopril  use    MEDICATIONS:  Current Outpatient Medications  Medication Sig Dispense Refill    ALFALFA PO Take 1 tablet by mouth daily.     amLODipine (NORVASC) 5 MG tablet Take 5 mg by mouth at bedtime.     Ascorbic Acid (VITAMIN C PO) Take 1 tablet by mouth daily.     b complex vitamins tablet Take 1 tablet by mouth daily.     Cholecalciferol (VITAMIN D-3 PO) Take 1 tablet by mouth daily.     ezetimibe (ZETIA) 10 MG tablet Take 10 mg by mouth daily with lunch.     fexofenadine (ALLEGRA) 60 MG tablet Take 60 mg by mouth daily as needed for allergies.     Flaxseed, Linseed, (FLAX SEED  OIL PO) Take 1 capsule by mouth daily.     Lecithin 1200 MG CAPS take one capsule daily Oral     PRESCRIPTION MEDICATION Place 1 application  into the right ear See admin instructions. Compounded capsule (powder) Sulfacetamide amphoteracin b + chloramphenincol + hydrocortisone. Apply to right ear every other day.     PSYLLIUM FIBER PO Take 1 capsule by mouth daily.     sodium chloride  (OCEAN) 0.65 % SOLN nasal spray Place 1 spray into the nose at bedtime.     umeclidinium-vilanterol (ANORO ELLIPTA) 62.5-25 MCG/ACT AEPB Inhale 1 puff into the lungs daily. 60 each 5   No current facility-administered medications for this visit.    REVIEW OF SYSTEMS:  A 10+ POINT REVIEW OF SYSTEMS WAS OBTAINED including neurology, dermatology, psychiatry, cardiac, respiratory, lymph, extremities, GI, GU, musculoskeletal, constitutional, reproductive, HEENT. ***   PHYSICAL EXAM:  vitals were not taken for this visit.   General: Alert and oriented, in no acute distress HEENT: Head is normocephalic. Extraocular movements are intact. Oropharynx is clear. Neck: Neck is supple, no palpable cervical or supraclavicular lymphadenopathy. Heart: Regular in rate and rhythm with no murmurs, rubs, or gallops. Chest: Clear to auscultation bilaterally, with no rhonchi, wheezes, or rales. Abdomen: Soft, nontender, nondistended, with no rigidity or guarding. Extremities: No cyanosis or edema. Lymphatics: see Neck Exam Skin: No concerning  lesions. Musculoskeletal: symmetric strength and muscle tone throughout. Neurologic: Cranial nerves II through XII are grossly intact. No obvious focalities. Speech is fluent. Coordination is intact. Psychiatric: Judgment and insight are intact. Affect is appropriate. ***  ECOG = ***  0 - Asymptomatic (Fully active, able to carry on all predisease activities without restriction)  1 - Symptomatic but completely ambulatory (Restricted in physically strenuous activity but ambulatory and able to carry out work of a light or sedentary nature. For example, light housework, office work)  2 - Symptomatic, <50% in bed during the day (Ambulatory and capable of all self care but unable to carry out any work activities. Up and about more than 50% of waking hours)  3 - Symptomatic, >50% in bed, but not bedbound (Capable of only limited self-care, confined to bed or chair 50% or more of waking hours)  4 - Bedbound (Completely disabled. Cannot carry on any self-care. Totally confined to bed or chair)  5 - Death   Raylene MM, Creech RH, Tormey DC, et al. [phone]). Toxicity and response criteria of the [facility]. Am. DOROTHA Bridges. Oncol. 5 (6): 649-55  LABORATORY DATA:  Lab Results  Component Value Date   WBC 7.1 01/27/2024   HGB 14.0 01/27/2024   HCT 39.7 01/27/2024   MCV 92.8 01/27/2024   PLT 176 01/27/2024   NEUTROABS 4.8 01/27/2024   Lab Results  Component Value Date   NA 135 01/27/2024   K 4.2 01/27/2024   CL 101 01/27/2024   CO2 26 01/27/2024   GLUCOSE 120 (H) 01/27/2024   BUN 19 01/27/2024   CREATININE 1.49 (H) 01/27/2024   CALCIUM 9.4 01/27/2024      RADIOGRAPHY: DG Chest Port 1 View Result Date: 01/02/2024 CLINICAL DATA:  Status post biopsy. EXAM: PORTABLE CHEST 1 VIEW COMPARISON:  Chest radiograph dated 12/17/2023. FINDINGS: Similar appearance of reticulonodular density in the left mid lung field with associated biopsy clips. No new consolidation. There is no  pleural effusion or pneumothorax. The cardiac silhouette is within normal limits. Left pectoral pacemaker device. No acute osseous pathology. IMPRESSION: 1. No pneumothorax. 2. Similar  appearance of reticulonodular density in the left mid lung field. Electronically Signed   By: Vanetta Chou M.D.   On: 01/02/2024 14:25   DG C-ARM BRONCHOSCOPY Result Date: 01/02/2024 C-ARM BRONCHOSCOPY: Fluoroscopy was utilized by the requesting physician.  No radiographic interpretation.   DG C-Arm 1-60 Min-No Report Result Date: 01/02/2024 Fluoroscopy was utilized by the requesting physician.  No radiographic interpretation.      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  We discussed the natural history of *** and general treatment, highlighting the role of radiotherapy in the management.  We discussed the available radiation techniques, and focused on the details of logistics and delivery.  We reviewed the anticipated acute and late sequelae associated with radiation in this setting.  The patient was encouraged to ask questions that I answered to the best of my ability. *** A patient consent form was discussed and signed.  We retained a copy for our records.  The patient would like to proceed with radiation and will be scheduled for CT simulation.  PLAN: ***    *** minutes of total time was spent for this patient encounter, including preparation, face-to-face counseling with the patient and coordination of care, physical exam, and documentation of the encounter.   ------------------------------------------------  Lynwood CHARM Nasuti, PhD, MD  This document serves as a record of services personally performed by Lynwood Nasuti, MD. It was created on his behalf by Reymundo Cartwright, a trained medical scribe. The creation of  this record is based on the scribe's personal observations and the provider's statements to them. This document has been checked and approved by the attending provider.

## 2024-01-28 NOTE — Telephone Encounter (Signed)
 Scheduled patient for next appointment. Called and spoke with the patient, he is aware.

## 2024-01-28 NOTE — Progress Notes (Signed)
 Location of tumor and Histology per Pathology Report:  Right upper lobe  Biopsy:    Past/Anticipated interventions by surgeon, if any:   Past/Anticipated interventions by medical oncology, if any:      Pain issues, if any:  {:18581} {PAIN DESCRIPTION:[id number]}  SAFETY ISSUES: Prior radiation? {:18581} Pacemaker/ICD? {:18581} Possible current pregnancy? no Is the patient on methotrexate? {:18581}  Current Complaints / other details:  ***     ***

## 2024-01-29 ENCOUNTER — Ambulatory Visit
Admission: RE | Admit: 2024-01-29 | Discharge: 2024-01-29 | Disposition: A | Discharge status: Home / Self Care | Source: Ambulatory Visit | Attending: Radiation Oncology | Admitting: Radiation Oncology

## 2024-01-29 ENCOUNTER — Encounter: Payer: Self-pay | Admitting: Cardiology

## 2024-01-29 ENCOUNTER — Ambulatory Visit (INDEPENDENT_AMBULATORY_CARE_PROVIDER_SITE_OTHER)

## 2024-01-29 ENCOUNTER — Encounter: Payer: Self-pay | Admitting: Radiation Oncology

## 2024-01-29 ENCOUNTER — Telehealth: Payer: Self-pay

## 2024-01-29 VITALS — BP 138/87 | HR 89 | Temp 97.1°F | Resp 18 | Ht 72.0 in | Wt 157.4 lb

## 2024-01-29 DIAGNOSIS — E785 Hyperlipidemia, unspecified: Secondary | ICD-10-CM | POA: Diagnosis not present

## 2024-01-29 DIAGNOSIS — Z801 Family history of malignant neoplasm of trachea, bronchus and lung: Secondary | ICD-10-CM | POA: Insufficient documentation

## 2024-01-29 DIAGNOSIS — Z79899 Other long term (current) drug therapy: Secondary | ICD-10-CM | POA: Insufficient documentation

## 2024-01-29 DIAGNOSIS — Z860101 Personal history of adenomatous and serrated colon polyps: Secondary | ICD-10-CM | POA: Insufficient documentation

## 2024-01-29 DIAGNOSIS — Z85528 Personal history of other malignant neoplasm of kidney: Secondary | ICD-10-CM | POA: Insufficient documentation

## 2024-01-29 DIAGNOSIS — Z8701 Personal history of pneumonia (recurrent): Secondary | ICD-10-CM | POA: Diagnosis not present

## 2024-01-29 DIAGNOSIS — Z8042 Family history of malignant neoplasm of prostate: Secondary | ICD-10-CM | POA: Insufficient documentation

## 2024-01-29 DIAGNOSIS — N529 Male erectile dysfunction, unspecified: Secondary | ICD-10-CM | POA: Insufficient documentation

## 2024-01-29 DIAGNOSIS — I10 Essential (primary) hypertension: Secondary | ICD-10-CM | POA: Diagnosis not present

## 2024-01-29 DIAGNOSIS — I442 Atrioventricular block, complete: Secondary | ICD-10-CM | POA: Diagnosis not present

## 2024-01-29 DIAGNOSIS — C3411 Malignant neoplasm of upper lobe, right bronchus or lung: Secondary | ICD-10-CM | POA: Diagnosis present

## 2024-01-29 DIAGNOSIS — F17210 Nicotine dependence, cigarettes, uncomplicated: Secondary | ICD-10-CM | POA: Diagnosis not present

## 2024-01-29 DIAGNOSIS — M129 Arthropathy, unspecified: Secondary | ICD-10-CM | POA: Diagnosis not present

## 2024-01-29 DIAGNOSIS — Z923 Personal history of irradiation: Secondary | ICD-10-CM | POA: Diagnosis not present

## 2024-01-29 DIAGNOSIS — Z85118 Personal history of other malignant neoplasm of bronchus and lung: Secondary | ICD-10-CM | POA: Diagnosis not present

## 2024-01-29 DIAGNOSIS — E039 Hypothyroidism, unspecified: Secondary | ICD-10-CM | POA: Diagnosis not present

## 2024-01-29 DIAGNOSIS — J439 Emphysema, unspecified: Secondary | ICD-10-CM | POA: Insufficient documentation

## 2024-01-29 NOTE — Telephone Encounter (Signed)
 Copied from CRM #[id number]. Topic: Clinical - Prescription Issue >> Jan 28, 2024  4:06 PM Leila BROCKS wrote: Reason for CRM: Patient [phone] states Dr. Zaida advised if umeclidinium-vilanterol [facility] ELLIPTA) 62.5-25 MCG/ACT AEPB is too expensive over $400 to contact the office and can Dr. Zaida prescribe something else cheaper. Please advise and call back.   [id number] - [place], [place] - [address] AT [facility] OF [address], [place] [address] Phone:[phone]:[phone]

## 2024-01-29 NOTE — Progress Notes (Signed)
 TO BE COMPLETED BY RADIATION ONCOLOGIST OFFICE:  Patient Name: Dylan Harvey   Date of Birth: 18-Jan-1951   Radiation Oncologist: Dr. Lynwood Nasuti   Site to be Treated: Right upper lung   Will x-rays >10 MV be used? No   Will the radiation be >10 cm from the device? yes   Planned Treatment Start Date: CT sim date 02/05/24   TO BE COMPLETED BY CARDIOLOGIST OFFICE:   Device Information:  Pacemaker [x]      ICD []    Brand: St. Jude/Abbott: [phone]  Model #: St. Jude Medical [id number] Assurity MRI Serial Number: [id number]      Date of Placement: 12/16/2023  Site of Placement: Left Chest  Remote Device Check--Frequency: Every 91 days  Last Check: 01/29/2024  Is the Patient Pacer Dependent?:  Yes [x]   No []   Does cardiologist request Radiation Oncology to schedule device testing by vendor for the following:  Prior to the Initiation of Treatments?  Yes []  No [x]  During Treatments?  Yes []  No [x]  Post Radiation Treatments?  Yes []  No [x]   Is device monitoring necessary by vendor/cardiologist team during treatments?  Yes []   No [x]   Is cardiac monitoring by Radiation Oncology nursing necessary during treatments? Yes []   No [x]   Do you recommend device be relocated prior to Radiation Treatment? Yes []   No [x]   **PLEASE LIST ANY NOTES OR SPECIAL REQUESTS:       CARDIOLOGIST SIGNATURE:  Dr. [name] Per Device Clinic Standing Orders, Dylan Harvey  01/29/2024 1:18 PM  **Please route completed form back to Radiation Oncology Nursing and P [facility] RAD ONC ADMIN, OR send an update if there will be a delay in having form completed by expected start date.  **Call [phone] if you have any questions or do not get an in-basket response from a Radiation Oncology staff member

## 2024-01-30 ENCOUNTER — Other Ambulatory Visit (HOSPITAL_COMMUNITY): Payer: Self-pay

## 2024-01-30 LAB — CUP PACEART REMOTE DEVICE CHECK
Battery Remaining Longevity: 97 mo
Battery Remaining Percentage: 94 %
Battery Voltage: 3.01 V
Brady Statistic AP VP Percent: 4.6 %
Brady Statistic AP VS Percent: 1 %
Brady Statistic AS VP Percent: 91 %
Brady Statistic AS VS Percent: 4.1 %
Brady Statistic RA Percent Paced: 5 %
Brady Statistic RV Percent Paced: 95 %
Date Time Interrogation Session: 20251112020015
Implantable Lead Connection Status: 753985
Implantable Lead Connection Status: 753985
Implantable Lead Implant Date: 20250929
Implantable Lead Implant Date: 20250929
Implantable Lead Location: 753859
Implantable Lead Location: 753860
Implantable Pulse Generator Implant Date: 20250929
Lead Channel Impedance Value: 550 Ohm
Lead Channel Impedance Value: 560 Ohm
Lead Channel Pacing Threshold Amplitude: 0.5 V
Lead Channel Pacing Threshold Amplitude: 0.625 V
Lead Channel Pacing Threshold Pulse Width: 0.5 ms
Lead Channel Pacing Threshold Pulse Width: 0.5 ms
Lead Channel Sensing Intrinsic Amplitude: 11.1 mV
Lead Channel Sensing Intrinsic Amplitude: 4.4 mV
Lead Channel Setting Pacing Amplitude: 0.875
Lead Channel Setting Pacing Amplitude: 3.5 V
Lead Channel Setting Pacing Pulse Width: 0.5 ms
Lead Channel Setting Sensing Sensitivity: 2 mV
Pulse Gen Model: 2272
Pulse Gen Serial Number: 8219539

## 2024-02-01 ENCOUNTER — Ambulatory Visit: Payer: Self-pay | Admitting: Cardiology

## 2024-02-03 DIAGNOSIS — Z9089 Acquired absence of other organs: Secondary | ICD-10-CM | POA: Diagnosis not present

## 2024-02-03 DIAGNOSIS — Z8669 Personal history of other diseases of the nervous system and sense organs: Secondary | ICD-10-CM | POA: Diagnosis not present

## 2024-02-03 DIAGNOSIS — H903 Sensorineural hearing loss, bilateral: Secondary | ICD-10-CM | POA: Diagnosis not present

## 2024-02-03 NOTE — Progress Notes (Signed)
 Remote PPM Transmission

## 2024-02-04 LAB — FUNGUS CULTURE WITH STAIN

## 2024-02-04 LAB — FUNGUS CULTURE RESULT

## 2024-02-04 LAB — FUNGAL ORGANISM REFLEX

## 2024-02-05 ENCOUNTER — Encounter: Admitting: Thoracic Surgery (Cardiothoracic Vascular Surgery)

## 2024-02-05 ENCOUNTER — Ambulatory Visit
Admission: RE | Admit: 2024-02-05 | Discharge: 2024-02-05 | Disposition: A | Discharge status: Home / Self Care | Source: Ambulatory Visit | Attending: Radiation Oncology | Admitting: Radiation Oncology

## 2024-02-05 DIAGNOSIS — C3411 Malignant neoplasm of upper lobe, right bronchus or lung: Secondary | ICD-10-CM | POA: Diagnosis present

## 2024-02-05 DIAGNOSIS — C3432 Malignant neoplasm of lower lobe, left bronchus or lung: Secondary | ICD-10-CM

## 2024-02-05 DIAGNOSIS — F17210 Nicotine dependence, cigarettes, uncomplicated: Secondary | ICD-10-CM | POA: Diagnosis not present

## 2024-02-07 ENCOUNTER — Other Ambulatory Visit: Payer: Self-pay

## 2024-02-07 ENCOUNTER — Telehealth: Payer: Self-pay

## 2024-02-07 DIAGNOSIS — J439 Emphysema, unspecified: Secondary | ICD-10-CM

## 2024-02-07 MED ORDER — BEVESPI AEROSPHERE 9-4.8 MCG/ACT IN AERO
2.0000 | INHALATION_SPRAY | Freq: Two times a day (BID) | RESPIRATORY_TRACT | 5 refills | Status: AC
Start: 2024-02-07 — End: ?

## 2024-02-07 NOTE — Telephone Encounter (Signed)
 Patient was called regarding needing an alternative for Anoro Ellipta  due to high cost. He was informed all the alternative cost the same price; he needs to meet deductible. No answer, left a detailed message.

## 2024-02-09 DIAGNOSIS — F17210 Nicotine dependence, cigarettes, uncomplicated: Secondary | ICD-10-CM | POA: Diagnosis not present

## 2024-02-09 DIAGNOSIS — C3411 Malignant neoplasm of upper lobe, right bronchus or lung: Secondary | ICD-10-CM | POA: Diagnosis not present

## 2024-02-12 DIAGNOSIS — E785 Hyperlipidemia, unspecified: Secondary | ICD-10-CM | POA: Diagnosis not present

## 2024-02-12 DIAGNOSIS — I129 Hypertensive chronic kidney disease with stage 1 through stage 4 chronic kidney disease, or unspecified chronic kidney disease: Secondary | ICD-10-CM | POA: Diagnosis not present

## 2024-02-12 DIAGNOSIS — E871 Hypo-osmolality and hyponatremia: Secondary | ICD-10-CM | POA: Diagnosis not present

## 2024-02-12 DIAGNOSIS — N1832 Chronic kidney disease, stage 3b: Secondary | ICD-10-CM | POA: Diagnosis not present

## 2024-02-12 DIAGNOSIS — Z85528 Personal history of other malignant neoplasm of kidney: Secondary | ICD-10-CM | POA: Diagnosis not present

## 2024-02-14 ENCOUNTER — Ambulatory Visit

## 2024-02-16 LAB — ACID FAST CULTURE WITH REFLEXED SENSITIVITIES (MYCOBACTERIA): Acid Fast Culture: NEGATIVE

## 2024-02-18 ENCOUNTER — Ambulatory Visit
Admission: RE | Admit: 2024-02-18 | Discharge: 2024-02-18 | Disposition: A | Discharge status: Home / Self Care | Source: Ambulatory Visit | Attending: Radiation Oncology | Admitting: Radiation Oncology

## 2024-02-18 ENCOUNTER — Other Ambulatory Visit: Payer: Self-pay

## 2024-02-18 DIAGNOSIS — C3432 Malignant neoplasm of lower lobe, left bronchus or lung: Secondary | ICD-10-CM | POA: Diagnosis present

## 2024-02-18 LAB — RAD ONC ARIA SESSION SUMMARY
Course Elapsed Days: 0
Plan Fractions Treated to Date: 1
Plan Prescribed Dose Per Fraction: 18 Gy
Plan Total Fractions Prescribed: 3
Plan Total Prescribed Dose: 54 Gy
Reference Point Dosage Given to Date: 18 Gy
Reference Point Session Dosage Given: 18 Gy
Session Number: 1

## 2024-02-19 ENCOUNTER — Ambulatory Visit: Admitting: Radiation Oncology

## 2024-02-20 ENCOUNTER — Other Ambulatory Visit: Payer: Self-pay

## 2024-02-20 ENCOUNTER — Ambulatory Visit
Admission: RE | Admit: 2024-02-20 | Discharge: 2024-02-20 | Disposition: A | Source: Ambulatory Visit | Attending: Radiation Oncology | Admitting: Radiation Oncology

## 2024-02-20 DIAGNOSIS — C3432 Malignant neoplasm of lower lobe, left bronchus or lung: Secondary | ICD-10-CM | POA: Diagnosis not present

## 2024-02-20 LAB — RAD ONC ARIA SESSION SUMMARY
Course Elapsed Days: 2
Plan Fractions Treated to Date: 2
Plan Prescribed Dose Per Fraction: 18 Gy
Plan Total Fractions Prescribed: 3
Plan Total Prescribed Dose: 54 Gy
Reference Point Dosage Given to Date: 36 Gy
Reference Point Session Dosage Given: 18 Gy
Session Number: 2

## 2024-02-21 ENCOUNTER — Ambulatory Visit: Admitting: Radiation Oncology

## 2024-02-24 ENCOUNTER — Ambulatory Visit: Admitting: Radiation Oncology

## 2024-02-24 DIAGNOSIS — R972 Elevated prostate specific antigen [PSA]: Secondary | ICD-10-CM | POA: Diagnosis not present

## 2024-02-24 DIAGNOSIS — C61 Malignant neoplasm of prostate: Secondary | ICD-10-CM | POA: Diagnosis not present

## 2024-02-24 DIAGNOSIS — N4231 Prostatic intraepithelial neoplasia: Secondary | ICD-10-CM | POA: Diagnosis not present

## 2024-02-26 ENCOUNTER — Ambulatory Visit
Admission: RE | Admit: 2024-02-26 | Discharge: 2024-02-26 | Disposition: A | Discharge status: Home / Self Care | Source: Ambulatory Visit | Attending: Radiation Oncology | Admitting: Radiation Oncology

## 2024-02-26 ENCOUNTER — Other Ambulatory Visit: Payer: Self-pay

## 2024-02-26 DIAGNOSIS — C3432 Malignant neoplasm of lower lobe, left bronchus or lung: Secondary | ICD-10-CM | POA: Diagnosis not present

## 2024-02-26 DIAGNOSIS — F17210 Nicotine dependence, cigarettes, uncomplicated: Secondary | ICD-10-CM | POA: Diagnosis not present

## 2024-02-26 DIAGNOSIS — Z510 Encounter for antineoplastic radiation therapy: Secondary | ICD-10-CM | POA: Diagnosis not present

## 2024-02-26 DIAGNOSIS — C3411 Malignant neoplasm of upper lobe, right bronchus or lung: Secondary | ICD-10-CM | POA: Diagnosis not present

## 2024-02-26 LAB — RAD ONC ARIA SESSION SUMMARY
Course Elapsed Days: 8
Plan Fractions Treated to Date: 3
Plan Prescribed Dose Per Fraction: 18 Gy
Plan Total Fractions Prescribed: 3
Plan Total Prescribed Dose: 54 Gy
Reference Point Dosage Given to Date: 54 Gy
Reference Point Session Dosage Given: 18 Gy
Session Number: 3

## 2024-02-28 ENCOUNTER — Ambulatory Visit: Admitting: Radiation Oncology

## 2024-02-28 NOTE — Radiation Completion Notes (Signed)
 Patient Name: Dylan Harvey, Dylan Harvey MRN: [id number] Date of Birth: 1950/12/30 Referring Physician: ZOLA HERTER, M.D. Date of Service: 2024-02-28 Radiation Oncologist: Signe Nasuti, M.D. 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) T=cT3, N=cN0, M=cM0 Intent: Curative     ==========DELIVERED PLANS==========  First Treatment Date: 2024-02-18 Last Treatment Date: 2024-02-26   Plan Name: Lung_R Site: Lung, Right Technique: SBRT/SRT-IMRT Mode: Photon Dose Per Fraction: 18 Gy Prescribed Dose (Delivered / Prescribed): 54 Gy / 54 Gy Prescribed Fxs (Delivered / Prescribed): 3 / 3     ==========ON TREATMENT VISIT DATES========== 2024-02-18, 2024-02-20, 2024-02-26, 2024-02-26     ==========UPCOMING VISITS========== 05/25/2024 [facility] EST PT 15 Sherrod Sherrod, MD  05/18/2024 [facility] LAB ONLY [facility]  04/29/2024 [facility] AT [facility] HOME REMOTE PACER [facility] HVT DEVICE REMOTES  03/17/2024 [facility] AT [facility] PHYS PACER CK Kennyth Chew, MD  03/04/2024 [facility] MR BRAIN WO CONTRAST [facility] 1        ==========APPENDIX - ON TREATMENT VISIT NOTES==========   See weekly On Treatment Notes in Epic for details in the Media tab (listed as Progress notes on the On Treatment Visit Dates listed above).

## 2024-03-02 DIAGNOSIS — C61 Malignant neoplasm of prostate: Secondary | ICD-10-CM | POA: Diagnosis not present

## 2024-03-03 ENCOUNTER — Other Ambulatory Visit (HOSPITAL_COMMUNITY): Payer: Self-pay

## 2024-03-03 NOTE — Progress Notes (Signed)
°  Device system confirmed to be MRI conditional, with implant date > 6 weeks ago, and no evidence of abandoned or epicardial leads in review of most recent CXR  Device last cleared by EP Provider: Prentice Passey 03/03/24  Clearance is good through for 1 year as long as parameters remain stable at time of check. If pt undergoes a cardiac device procedure during that time, they should be re-cleared.   Tachy-therapies to be programmed off if applicable with device back to pre-MRI settings after completion of exam.  [employer]bbott[employer] will be present for programming for the MRI.   Emogene Bouchard, [place]  03/03/2024 12:44 PM

## 2024-03-04 ENCOUNTER — Ambulatory Visit (HOSPITAL_COMMUNITY): Admission: RE | Admit: 2024-03-04 | Discharge: 2024-03-04

## 2024-03-04 ENCOUNTER — Other Ambulatory Visit (HOSPITAL_COMMUNITY): Payer: Self-pay | Admitting: Urology

## 2024-03-04 DIAGNOSIS — C3491 Malignant neoplasm of unspecified part of right bronchus or lung: Secondary | ICD-10-CM | POA: Diagnosis present

## 2024-03-04 DIAGNOSIS — C61 Malignant neoplasm of prostate: Secondary | ICD-10-CM

## 2024-03-04 NOTE — Progress Notes (Signed)
 Patient was monitored by this RN during MRI scan due to presence of a pacemaker. Cardiac rhythm was continuously monitored throughout the procedure. Prior to the start of the scan, the pacemaker was placed in MRI-safe mode by the pacemaker representative. Following the completion of the scan, the device was returned to its pre-MRI settings. Neurological status and orientation post-procedure were unchanged from baseline.   Pre-procedure Heart Rate (Prior to being placed in MRI safe mode): 88 Intra-MRI HR: 90 Post-procedure Heart Rate (Once pacemaker is returned to baseline mode): 86

## 2024-03-06 ENCOUNTER — Encounter: Admitting: Thoracic Surgery (Cardiothoracic Vascular Surgery)

## 2024-03-06 ENCOUNTER — Other Ambulatory Visit (HOSPITAL_COMMUNITY)

## 2024-03-16 ENCOUNTER — Telehealth: Payer: Self-pay

## 2024-03-16 NOTE — Telephone Encounter (Signed)
 Copied from CRM #[id number]. Topic: Clinical - Medication Question >> Mar 16, 2024  2:46 PM Isabell A wrote: Reason for CRM: Patient would like to confirm if feel nauseas is normal after using Glycopyrrolate-Formoterol (BEVESPI  AEROSPHERE) 9-4.8 MCG/ACT AERO [[id number]]. It has only happened one time while eating rich holiday sauce.   Callback number: [phone]

## 2024-03-16 NOTE — Telephone Encounter (Signed)
 He states he had it one time and he had also had some rich food.  He has not had the problem since then.  His wife looked up the side effects and said he should call and let us  know.  I let know that n/v is a potential side effect and if it happens again or becomes a regular occurrence to call and let us  know.  He verbalized understanding.  Nothing further needed.

## 2024-03-17 ENCOUNTER — Encounter: Admitting: Cardiology

## 2024-03-24 ENCOUNTER — Inpatient Hospital Stay
Admission: RE | Admit: 2024-03-24 | Discharge: 2024-03-24 | Disposition: A | Payer: Self-pay | Source: Ambulatory Visit | Attending: Radiation Oncology | Admitting: Radiation Oncology

## 2024-03-24 ENCOUNTER — Other Ambulatory Visit: Payer: Self-pay | Admitting: Radiation Oncology

## 2024-03-24 ENCOUNTER — Telehealth: Payer: Self-pay | Admitting: Radiation Oncology

## 2024-03-24 DIAGNOSIS — C61 Malignant neoplasm of prostate: Secondary | ICD-10-CM

## 2024-03-24 NOTE — Telephone Encounter (Signed)
 Left message for patient to call back to schedule consult per 12/22 referral.

## 2024-03-25 ENCOUNTER — Encounter (HOSPITAL_COMMUNITY)
Admission: RE | Admit: 2024-03-25 | Discharge: 2024-03-25 | Disposition: A | Discharge status: Home / Self Care | Source: Ambulatory Visit | Attending: Urology | Admitting: Urology

## 2024-03-25 DIAGNOSIS — C61 Malignant neoplasm of prostate: Secondary | ICD-10-CM | POA: Diagnosis present

## 2024-03-25 MED ORDER — FLOTUFOLASTAT F 18 GALLIUM 296-5846 MBQ/ML IV SOLN
8.0700 | Freq: Once | INTRAVENOUS | Status: AC
  Administered 2024-03-25: 8.07 via INTRAVENOUS

## 2024-03-27 ENCOUNTER — Telehealth: Payer: Self-pay | Admitting: Radiation Oncology

## 2024-03-27 NOTE — Telephone Encounter (Signed)
 Left message for patient to call back to schedule consult per 12/22 referral.

## 2024-04-01 NOTE — Progress Notes (Signed)
 GU Location of Tumor / Histology: Prostate Ca  If Prostate Cancer, Gleason Score is (4 + 3) and PSA is (7.0 on 02/24/2024)  PSA 5.04 on 08/2023 PSA 3.89 on 12/2022  Dylan Harvey presented as referral from Dr. Lonni Han [facility]) elevated PSA.  Biopsies     03/25/2024 Dr. Lonni Han NM PET (PSMA) Skull to Mid Thigh CLINICAL DATA:  New diagnosis of prostate cancer with PSA of 7.0 history of non-small-cell lung cancer.  IMPRESSION: 1. Right apical relatively small volume primary. No evidence of tracer avid nodal or osseous metastasis. 2. Incidental findings, including: Coronary artery atherosclerosis. Aortic Atherosclerosis (ICD10-I70.0). Right nephrectomy. Emphysema (ICD10-J43.9).    10/28/2023 Dr. Lonni Han MR Prostate with/without Contrast CLINICAL DATA:  Elevated PSA level. R97.20   IMPRESSION: 1. PI-RADS category 4 lesion of the right posterolateral peripheral zone at the apex. Targeting data sent to [facility]. 2. Mild benign prostatic hypertrophy. 3. Sigmoid colon diverticulosis.   Past/Anticipated interventions by urology, if any:  Dr. Lonni Han   Past/Anticipated interventions by medical oncology, if any:  NA  Weight changes, if any:  No  IPSS:  1 SHIM:  6  Bowel/Bladder complaints, if any:  No  Nausea/Vomiting, if any:  No  Pain issues, if any:  0/10  SAFETY ISSUES: Prior radiation? Yes, Left lower lobe 02/18/2024-02/26/2024 & Left lung 01/18/2020-01/25/2020 both with Dr. Lynwood Nasuti. Pacemaker/ICD? Yes, Pacemaker Possible current pregnancy? Male Is the patient on methotrexate?  No  Current Complaints / other details: None  30 minutes spent total, including time for meaningful use questions, reviewing medication, as well as spent in face-to-face time in nurse evaluation with the patient.

## 2024-04-02 ENCOUNTER — Encounter: Payer: Self-pay | Admitting: Radiation Oncology

## 2024-04-06 ENCOUNTER — Ambulatory Visit
Admission: RE | Admit: 2024-04-06 | Discharge: 2024-04-06 | Disposition: A | Source: Ambulatory Visit | Attending: Radiation Oncology | Admitting: Radiation Oncology

## 2024-04-06 ENCOUNTER — Encounter: Payer: Self-pay | Admitting: Radiation Oncology

## 2024-04-06 ENCOUNTER — Ambulatory Visit
Admission: RE | Admit: 2024-04-06 | Discharge: 2024-04-06 | Disposition: A | Discharge status: Home / Self Care | Source: Ambulatory Visit | Attending: Radiation Oncology | Admitting: Radiation Oncology

## 2024-04-06 ENCOUNTER — Encounter: Payer: Self-pay | Admitting: Cardiology

## 2024-04-06 VITALS — BP 144/79 | HR 110 | Temp 97.9°F | Resp 20 | Ht 72.0 in | Wt 161.0 lb

## 2024-04-06 DIAGNOSIS — Z8701 Personal history of pneumonia (recurrent): Secondary | ICD-10-CM | POA: Diagnosis not present

## 2024-04-06 DIAGNOSIS — J432 Centrilobular emphysema: Secondary | ICD-10-CM | POA: Insufficient documentation

## 2024-04-06 DIAGNOSIS — M19041 Primary osteoarthritis, right hand: Secondary | ICD-10-CM | POA: Diagnosis not present

## 2024-04-06 DIAGNOSIS — C61 Malignant neoplasm of prostate: Secondary | ICD-10-CM | POA: Diagnosis present

## 2024-04-06 DIAGNOSIS — I700 Atherosclerosis of aorta: Secondary | ICD-10-CM | POA: Diagnosis not present

## 2024-04-06 DIAGNOSIS — R918 Other nonspecific abnormal finding of lung field: Secondary | ICD-10-CM | POA: Insufficient documentation

## 2024-04-06 DIAGNOSIS — N400 Enlarged prostate without lower urinary tract symptoms: Secondary | ICD-10-CM | POA: Insufficient documentation

## 2024-04-06 DIAGNOSIS — I2510 Atherosclerotic heart disease of native coronary artery without angina pectoris: Secondary | ICD-10-CM | POA: Insufficient documentation

## 2024-04-06 DIAGNOSIS — Z801 Family history of malignant neoplasm of trachea, bronchus and lung: Secondary | ICD-10-CM | POA: Diagnosis not present

## 2024-04-06 DIAGNOSIS — E785 Hyperlipidemia, unspecified: Secondary | ICD-10-CM | POA: Diagnosis not present

## 2024-04-06 DIAGNOSIS — Z79899 Other long term (current) drug therapy: Secondary | ICD-10-CM | POA: Insufficient documentation

## 2024-04-06 DIAGNOSIS — Z950 Presence of cardiac pacemaker: Secondary | ICD-10-CM | POA: Insufficient documentation

## 2024-04-06 DIAGNOSIS — Z905 Acquired absence of kidney: Secondary | ICD-10-CM | POA: Diagnosis not present

## 2024-04-06 DIAGNOSIS — Z85528 Personal history of other malignant neoplasm of kidney: Secondary | ICD-10-CM | POA: Insufficient documentation

## 2024-04-06 DIAGNOSIS — K5730 Diverticulosis of large intestine without perforation or abscess without bleeding: Secondary | ICD-10-CM | POA: Diagnosis not present

## 2024-04-06 DIAGNOSIS — N529 Male erectile dysfunction, unspecified: Secondary | ICD-10-CM | POA: Diagnosis not present

## 2024-04-06 DIAGNOSIS — Z8553 Personal history of malignant neoplasm of renal pelvis: Secondary | ICD-10-CM | POA: Insufficient documentation

## 2024-04-06 DIAGNOSIS — F17210 Nicotine dependence, cigarettes, uncomplicated: Secondary | ICD-10-CM | POA: Insufficient documentation

## 2024-04-06 DIAGNOSIS — M19042 Primary osteoarthritis, left hand: Secondary | ICD-10-CM | POA: Diagnosis not present

## 2024-04-06 DIAGNOSIS — Z8042 Family history of malignant neoplasm of prostate: Secondary | ICD-10-CM | POA: Diagnosis not present

## 2024-04-06 DIAGNOSIS — D126 Benign neoplasm of colon, unspecified: Secondary | ICD-10-CM | POA: Insufficient documentation

## 2024-04-06 DIAGNOSIS — I10 Essential (primary) hypertension: Secondary | ICD-10-CM | POA: Diagnosis not present

## 2024-04-06 DIAGNOSIS — K2100 Gastro-esophageal reflux disease with esophagitis, without bleeding: Secondary | ICD-10-CM | POA: Insufficient documentation

## 2024-04-06 HISTORY — DX: Malignant neoplasm of unspecified part of unspecified bronchus or lung: C34.90

## 2024-04-06 HISTORY — DX: Elevated prostate specific antigen (PSA): R97.20

## 2024-04-06 HISTORY — DX: Malignant neoplasm of prostate: C61

## 2024-04-06 NOTE — Progress Notes (Signed)
 " Radiation Oncology         [phone]) [phone] ________________________________  Initial Outpatient Consultation  Name: Dylan Harvey MRN: [id number]  Date: 04/06/2024  DOB: [birth year]  RR:Ezmpwp, Oneil, MD  Devere Lonni Righter, MD   REFERRING PHYSICIAN: Devere Lonni Righter, MD  DIAGNOSIS: 74 y.o. gentleman with Stage T1c adenocarcinoma of the prostate with Gleason score of 4+3, and PSA of 5.04.    ICD-10-CM   1. Malignant neoplasm of prostate (HCC)  C61       HISTORY OF PRESENT ILLNESS: Dylan Harvey is a 74 y.o. male with a diagnosis of prostate cancer.   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 Shannon to 54 Gy in 3 fractions.  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  Routine follow-up chest CT without contrast on 07/01/23 demonstrated a new 7 mm irregular nodular opacity in the anterior right upper lobe, as well as a new left lower lobe pulmonary nodule measuring 6 mm. Short interval follow-up chest CT on 10/07/23 demonstrated an increase in size of the peripheral irregular nodular opacity in the RUL measuring 11 x 7 mm (previously measuring 7 mm). Imaging otherwise shows stable post treatment scaring in the perihilar left lung (without any new areas of nodularity present); and stability of the 9 mm short axis lymph node in the right hilum. This prompted an FDG PET-CT on 10/23/23 showing the enlarging peripheral right upper lobe pulmonary nodule to be hypermetabolic and consistent with either a metachronous lung cancer or metastatic lesion.  Also, the PET showed a 7 mm hypermetabolic nodule in the medial aspect of the  right upper lobe is also consistent with a small neoplasm, likely metastatic.  There was no mediastinal or hilar adenopathy and no findings for abdominal/pelvic metastatic disease or osseous metastatic disease.   On 01/02/24, he had navigational bronch to biopsy the central #1, and peripheral #2 nodules in the RUL, and both were Squamous Cell Carcinoma.  He had SBRT to the two RUL lesions in December 2025.    In the midst of the work-up this Fall, his screening PSA in the Fall of 2025 was 5.04.  So, he had a prostate MRI on 11/06/23 showing a PI-RADS category 4 lesion of the right posterolateral peripheral zone at the apex with focally reduced T2 signal (image 39 series 9) corresponding to reduced ADC map activity (image 13 series 6) and focal early enhancement (image 119 series 12). This measures 0.30 cc (1.0 by 0.9 by 0.7 cm). On 3D volumetric analysis: Prostate volume 31.5 cc.      The patient proceeded to transrectal ultrasound with 12 biopsies of the prostate on 02/24/2024.  The prostate volume measured 39 cc.  Out of 13 core biopsies, 7 were positive.  The maximum Gleason score was 4+3=7, and this was seen in Right Lat Mid and Right Lat Apex with the ROI#1 sample showing 3+4    Staging PSMA PET on 03/25/24 showed Right apical relatively small volume primary. No evidence of tracer avid nodal or osseous metastasis.    The patient reviewed the biopsy results with his urologist and he has kindly  been referred today for discussion of potential radiation treatment options. SABRA  PREVIOUS RADIATION THERAPY: No  PAST MEDICAL HISTORY:  Past Medical History:  Diagnosis Date   Allergy    Arthritis    thumbs   Cataract    Colonic adenoma 2009   ED (erectile dysfunction)    Elevated PSA    Emphysema lung (HCC) MILD    Gross hematuria    Hematospermia    History of colon polyps    History of radiation therapy 01/18/20-01/25/2020   Left Lung 3 fx; Dr. Lynwood Nasuti   History of radiation therapy  07/24/2018-07/31/2018   SBRT Left lung; Dr Lynwood Nasuti   Hyperlipemia    Hypertension    Left testicular pain    Lumbago    Lung cancer (HCC)    Lung nodules 12/2023   right upper lobe   Neoplasm of kidney    Pneumonia    had it as a child   Presence of permanent cardiac pacemaker    Prostate cancer (HCC)    Renal cancer (HCC)    Renal cyst    Right testicular pain    Tobacco abuse       PAST SURGICAL HISTORY: Past Surgical History:  Procedure Laterality Date   COLONOSCOPY     ENDOBRONCHIAL ULTRASOUND N/A 01/02/2024   Procedure: ENDOBRONCHIAL ULTRASOUND (EBUS);  Surgeon: Kara Dorn NOVAK, MD;  Location: [facility] ENDOSCOPY;  Service: Pulmonary;  Laterality: N/A;   FUDUCIAL PLACEMENT N/A 06/11/2018   Procedure: PLACEMENT OF FUDUCIAL;  Surgeon: Kerrin Elspeth BROCKS, MD;  Location: [facility] OR;  Service: Thoracic;  Laterality: N/A;   HERNIA REPAIR     inguinal   INNER EAR SURGERY     PACEMAKER IMPLANT N/A 12/16/2023   Procedure: PACEMAKER IMPLANT;  Surgeon: Kennyth Chew, MD;  Location: [facility] INVASIVE CV LAB;  Service: Cardiovascular;  Laterality: N/A;   POLYPECTOMY     ROBOT ASSITED LAPAROSCOPIC NEPHROURETERECTOMY Right 09/07/2020   Procedure: XI ROBOT ASSITED LAPAROSCOPIC NEPHROURETERECTOMY/ CYSTOSCOPY/ STENT PLACEMENT/ TRANSURETHRAL RESECTION OF URETERAL ORIFICE;  Surgeon: Devere Lonni Righter, MD;  Location: [facility] ORS;  Service: Urology;  Laterality: Right;   VIDEO BRONCHOSCOPY WITH ENDOBRONCHIAL NAVIGATION N/A 06/11/2018   Procedure: VIDEO BRONCHOSCOPY WITH ENDOBRONCHIAL NAVIGATION;  Surgeon: Kerrin Elspeth BROCKS, MD;  Location: [facility] OR;  Service: Thoracic;  Laterality: N/A;   VIDEO BRONCHOSCOPY WITH ENDOBRONCHIAL NAVIGATION N/A 01/02/2024   Procedure: VIDEO BRONCHOSCOPY WITH ENDOBRONCHIAL NAVIGATION;  Surgeon: Kara Dorn NOVAK, MD;  Location: [facility] ENDOSCOPY;  Service: Pulmonary;  Laterality: N/A;   WISDOM TOOTH EXTRACTION      FAMILY HISTORY:  Family History  Problem Relation  Age of Onset   Alzheimer's disease Mother    Hypothyroidism Mother    Severe combined immunodeficiency Mother    Lung cancer Father    Prostate cancer Father    Colon cancer Neg Hx    Colon polyps Neg Hx    Esophageal cancer Neg Hx    Rectal cancer Neg Hx    Stomach cancer Neg Hx     SOCIAL HISTORY:  Social History   Socioeconomic History   Marital status: Married    Spouse name: Not on file   Number of children: Not on file   Years of education: Not on file   Highest education level: Not on file  Occupational History   Not on file  Tobacco Use   Smoking status: Every Day    Current packs/day: 1.00    Average packs/day: 1 pack/day for 61.0 years (  61.0 ttl pk-yrs)    Types: Cigarettes    Start date: 1965    Passive exposure: Current   Smokeless tobacco: Never   Tobacco comments:    Started 59 years when he was [age] yrs old  ago admits he knows its not good MMP cma        3 quarters of a pack 01/10/2024 KRd  Vaping Use   Vaping status: Never Used  Substance and Sexual Activity   Alcohol  use: Yes    Alcohol /week: 2.0 standard drinks of alcohol     Types: 2 Shots of liquor per week    Comment: Saturday night   Drug use: No   Sexual activity: Not Currently  Other Topics Concern   Not on file  Social History Narrative   Married 1980 no chilldren. Went to college   Occupation is Preservationist @ [employer]   Social Drivers of Health   Tobacco Use: High Risk (04/06/2024)   Patient History    Smoking Tobacco Use: Every Day    Smokeless Tobacco Use: Never    Passive Exposure: Current  Financial Resource Strain: Not on file  Food Insecurity: No Food Insecurity (04/06/2024)   ACO Reach    Worried About Running Out of Food in the Last Year: No    Ran Out of Food in the Last Year: No  Transportation Needs: No Transportation Needs (04/06/2024)   ACO Reach    Lack of Transportation: No  Physical Activity: Not on file  Stress: Not on file   Social Connections: Not on file  Intimate Partner Violence: Not At Risk (04/06/2024)   ACO Reach    Feels Physically and Emotionally Safe: Yes    Physically Hurt by Someone: No    Humiliated or Emotionally Abused by Someone: No  Depression (PHQ2-9): Not on file  Alcohol  Screen: Not on file  Housing: Not At Risk (04/06/2024)   ACO Reach    Has Housing: Yes    Worried About Losing Housing: No    Unable to Get Utilities: No  Utilities: Not At Risk (04/06/2024)   ACO Reach    Has Housing: Yes    Worried About Losing Housing: No    Unable to Get Utilities: No  Health Literacy: Not on file    ALLERGIES: Neosporin original [neomycin-bacitracin zn-polymyx], Rosuvastatin, and Zestril  [lisinopril ]  MEDICATIONS:  Current Outpatient Medications  Medication Sig Dispense Refill   ALFALFA PO Take 1 tablet by mouth daily.     amLODipine  (NORVASC ) 5 MG tablet Take 5 mg by mouth at bedtime.     Ascorbic Acid (VITAMIN C PO) Take 1 tablet by mouth daily.     b complex vitamins tablet Take 1 tablet by mouth daily.     Cholecalciferol (VITAMIN D-3 PO) Take 1 tablet by mouth daily.     ezetimibe (ZETIA) 10 MG tablet Take 10 mg by mouth daily with lunch.     fexofenadine (ALLEGRA) 60 MG tablet Take 60 mg by mouth daily as needed for allergies.     Flaxseed, Linseed, (FLAX SEED OIL PO) Take 1 capsule by mouth daily.     Glycopyrrolate-Formoterol (BEVESPI  AEROSPHERE) 9-4.8 MCG/ACT AERO Inhale 2 puffs into the lungs 2 (two) times daily. 10.7 g 5   Lecithin 1200 MG CAPS take one capsule daily Oral     PRESCRIPTION MEDICATION Place 1 application  into the right ear See admin instructions. Compounded capsule (powder) Sulfacetamide amphoteracin b + chloramphenincol + hydrocortisone. Apply to right ear  every other day.     PSYLLIUM FIBER PO Take 1 capsule by mouth daily.     sodium chloride  (OCEAN) 0.65 % SOLN nasal spray Place 1 spray into the nose at bedtime.     No current facility-administered medications  for this encounter.      REVIEW OF SYSTEMS:  On review of systems, the patient reports that he is doing well overall. He denies any chest pain, shortness of breath, cough, fevers, chills, night sweats, unintended weight changes. He denies any bowel disturbances, and denies abdominal pain, nausea or vomiting. He denies any new musculoskeletal or joint aches or pains. His IPSS was Total Score: 1, indicating mild urinary symptoms (Reference 0-7 mild, 8-19 moderate, 20-35 severe).  His SHIM: 6, indicating he has severe erectile dysfunction (Reference - 22-25 None, 17-21 Mild, 8-16 Moderate, 1-7 Severe). A complete review of systems is obtained and is otherwise negative.     PHYSICAL EXAM:  Wt Readings from Last 3 Encounters:  04/06/24 161 lb (73 kg)  01/29/24 157 lb 6 oz (71.4 kg)  01/27/24 158 lb (71.7 kg)   Temp Readings from Last 3 Encounters:  04/06/24 97.9 F (36.6 C)  01/29/24 (!) 97.1 F (36.2 C) (Temporal)  01/27/24 98 F (36.7 C) (Temporal)   BP Readings from Last 3 Encounters:  04/06/24 (!) 144/79  01/29/24 138/87  01/27/24 (!) 143/85   Pulse Readings from Last 3 Encounters:  04/06/24 (!) 110  01/29/24 89  01/27/24 92   Pain Assessment Pain Score: 0-No pain/10  In general this is a well appearing male in no acute distress. He's alert and oriented x4 and appropriate throughout the examination. Cardiopulmonary assessment is negative for acute distress, and he exhibits normal effort.     KPS = 100  100 - Normal; no complaints; no evidence of disease. 90   - Able to carry on normal activity; minor signs or symptoms of disease. 80   - Normal activity with effort; some signs or symptoms of disease. 4   - Cares for self; unable to carry on normal activity or to do active work. 60   - Requires occasional assistance, but is able to care for most of his personal needs. 50   - Requires considerable assistance and frequent medical care. 40   - Disabled; requires special care  and assistance. 30   - Severely disabled; hospital admission is indicated although death not imminent. 20   - Very sick; hospital admission necessary; active supportive treatment necessary. 10   - Moribund; fatal processes progressing rapidly. 0     - Dead  Karnofsky DA, Abelmann WH, Craver LS and Burchenal [facility] [phone]) The use of the nitrogen mustards in the palliative treatment of carcinoma: with particular reference to bronchogenic carcinoma Cancer 1 634-56  LABORATORY DATA:  Lab Results  Component Value Date   WBC 7.1 01/27/2024   HGB 14.0 01/27/2024   HCT 39.7 01/27/2024   MCV 92.8 01/27/2024   PLT 176 01/27/2024   Lab Results  Component Value Date   NA 135 01/27/2024   K 4.2 01/27/2024   CL 101 01/27/2024   CO2 26 01/27/2024   Lab Results  Component Value Date   ALT 16 01/27/2024   AST 21 01/27/2024   ALKPHOS 70 01/27/2024   BILITOT 0.5 01/27/2024     RADIOGRAPHY: NM PET (PSMA) SKULL TO MID THIGH Result Date: 03/29/2024 CLINICAL DATA:  New diagnosis of prostate cancer with PSA of 7.0. history of non-small-cell lung  cancer. EXAM: NUCLEAR MEDICINE PET SKULL BASE TO THIGH TECHNIQUE: 8.1 millicuries mCi Flotufolastat (Posluma ) was injected intravenously. Full-ring PET imaging was performed from the skull base to thigh after the radiotracer. CT data was obtained and used for attenuation correction and anatomic localization. COMPARISON:  Prostate MRI of 10/28/2023. Chest CT of 12/13/2023. FDG PET of 10/23/2023. FINDINGS: NECK No tracer avid cervical nodes. Incidental CT finding: No cervical adenopathy. Right-sided mastoidectomy. Presumed sebaceous cyst about the posterior neck. CHEST No tracer avid thoracic nodes. Left perihilar radiation change is mildly tracer avid at SUV 2.7 and similar to on the prior PET. The right upper lobe pulmonary nodule on the prior PET is grossly similar at 1.3 cm and demonstrates low-level affinity measuring SUV 1.8. Incidental CT finding: Centrilobular  emphysema. Aortic and coronary artery calcification. Pacer. ABDOMEN/PELVIS Prostate: Right posterolateral apical focus of tracer affinity is estimated at 1.3 cm and SUV 5.8. Lymph nodes: No abnormal radiotracer accumulation within pelvic or abdominal nodes. Liver: No evidence of liver metastasis. Incidental CT finding: Right nephrectomy. Normal adrenal glands. Abdominal aortic atherosclerosis. Extensive colonic diverticulosis. Mild prostatomegaly. SKELETON No focal activity to suggest skeletal metastasis. IMPRESSION: 1. Right apical relatively small volume primary. No evidence of tracer avid nodal or osseous metastasis. 2. Incidental findings, including: Coronary artery atherosclerosis. Aortic Atherosclerosis (ICD10-I70.0). Right nephrectomy. Emphysema (ICD10-J43.9). Electronically Signed   By: Rockey Kilts M.D.   On: 03/29/2024 15:14      IMPRESSION/PLAN: 1. 74 y.o. gentleman with Stage T1c,  adenocarcinoma of the prostate with Gleason score of 4+3, and PSA of 5.04. Dicussed the patient's workup and outlined the nature of prostate cancer in this setting. The patient's T stage, Gleason's score, and PSA put him into the unfavorable intermediate risk group. Accordingly, he is eligible for a variety of potential treatment options including  brachytherapy, 5.5 weeks of external radiation, or prostatectomy. We discussed the available radiation techniques, and focused on the details and logistics of delivery.  We discussed and outlined the risks, benefits, short and long-term effects associated with radiotherapy and compared and contrasted these with prostatectomy. We discussed the role of SpaceOAR gel in reducing the rectal toxicity associated with radiotherapy.   He appears to have a good understanding of his disease and our treatment recommendations which are of curative intent.  He was encouraged to ask questions that were answered to his stated satisfaction.  At the conclusion of our conversation, the patient  is interested in moving forward with LDR Brachytherapy seed implant.  His wife works as a IT TRAINER, and they will discuss whether to consider undergoing the procedure prior to April 15th.   I personally spent 60 minutes in this encounter including chart review, reviewing radiological studies, meeting face-to-face with the patient, entering orders and completing documentation.       Donnice Barge, MD  [facility]  Radiation Oncology Direct Dial: [phone]  Fax: [phone] [email or web address]  Skype  LinkedIn   This document serves as a record of services personally performed by Donnice Barge, MD and Sabra Rusk, PA-C. It was created on their behalf by Damien Blanks, a trained medical scribe. The creation of this record is based on the scribe's personal observations and the provider's statements to them. This document has been checked and approved by the attending provider. "

## 2024-04-06 NOTE — Progress Notes (Signed)
 TO BE COMPLETED BY RADIATION ONCOLOGIST OFFICE:   Patient Name: Dylan Harvey   Date of Birth: Mar 09, 1951   Radiation Oncologist: Dr. Donnice Barge   Site to be Treated: Prostate   Will x-rays >10 MV be used? No   Will the radiation be >10 cm from the device? Yes   Planned Treatment Start Date: 2 weeks   TO BE COMPLETED BY CARDIOLOGIST OFFICE:   Device Information:  Pacemaker [x]      ICD []    Brand: St. Jude/Abbott: [phone] Model #: St. Jude Medical 2272 Assurity MRI  Serial Number: [id number]     Date of Placement: 12/12/2023  Site of Placement: Left Chest  Remote Device Check--Frequency: Every 91 days   Last Check: 01/29/2024  Is the Patient Pacer Dependent?:  Yes [x]   No []   Does cardiologist request Radiation Oncology to schedule device testing by vendor for the following:  Prior to the Initiation of Treatments?  Yes []  No [x]  During Treatments?  Yes []  No [x]  Post Radiation Treatments?  Yes []  No [x]   Is device monitoring necessary by vendor/cardiologist team during treatments?  Yes []   No [x]   Is cardiac monitoring by Radiation Oncology nursing necessary during treatments? Yes []   No [x]   Do you recommend device be relocated prior to Radiation Treatment? Yes []   No [x]   **PLEASE LIST ANY NOTES OR SPECIAL REQUESTS:       CARDIOLOGIST SIGNATURE: [name], MD Per Device Clinic Standing Orders, [name]  04/06/2024 2:19 PM  **Please route completed form back to Radiation Oncology Nursing and P [facility] RAD ONC ADMIN, OR send an update if there will be a delay in having form completed by expected start date.  **Call [phone] if you have any questions or do not get an in-basket response from a Radiation Oncology staff member

## 2024-04-08 ENCOUNTER — Telehealth: Payer: Self-pay | Admitting: Student

## 2024-04-08 NOTE — Telephone Encounter (Signed)
 Patient calling stating during 04/14/24 appointment he will be at the appointment at 12 and leave a quarter til 1, Patient did not want to reschedule appointment but wanted me to notify M.Tillery

## 2024-04-09 NOTE — Telephone Encounter (Signed)
 Left message for patient to call back with Jodie RIGGERS updates regarding office visit and patient previous call.

## 2024-04-09 NOTE — Telephone Encounter (Signed)
 Patient returned RN's call and stated he will come in around 11:30 am on Tuesday, 1/27.

## 2024-04-14 ENCOUNTER — Ambulatory Visit: Admitting: Student

## 2024-04-14 ENCOUNTER — Encounter: Payer: Self-pay | Admitting: Student

## 2024-04-14 VITALS — BP 134/78 | HR 93 | Ht 72.0 in | Wt 159.0 lb

## 2024-04-14 DIAGNOSIS — I442 Atrioventricular block, complete: Secondary | ICD-10-CM | POA: Diagnosis not present

## 2024-04-14 DIAGNOSIS — I10 Essential (primary) hypertension: Secondary | ICD-10-CM | POA: Insufficient documentation

## 2024-04-14 LAB — CUP PACEART INCLINIC DEVICE CHECK
Battery Remaining Longevity: 110 mo
Battery Voltage: 3.01 V
Brady Statistic RA Percent Paced: 2.6 %
Brady Statistic RV Percent Paced: 98 %
Date Time Interrogation Session: 20260127121431
Implantable Lead Connection Status: 753985
Implantable Lead Connection Status: 753985
Implantable Lead Implant Date: 20250929
Implantable Lead Implant Date: 20250929
Implantable Lead Location: 753859
Implantable Lead Location: 753860
Implantable Pulse Generator Implant Date: 20250929
Lead Channel Impedance Value: 512.5 Ohm
Lead Channel Impedance Value: 600 Ohm
Lead Channel Pacing Threshold Amplitude: 0.5 V
Lead Channel Pacing Threshold Amplitude: 0.5 V
Lead Channel Pacing Threshold Amplitude: 0.625 V
Lead Channel Pacing Threshold Pulse Width: 0.5 ms
Lead Channel Pacing Threshold Pulse Width: 0.5 ms
Lead Channel Pacing Threshold Pulse Width: 0.5 ms
Lead Channel Sensing Intrinsic Amplitude: 5 mV
Lead Channel Setting Pacing Amplitude: 0.875
Lead Channel Setting Pacing Amplitude: 2 V
Lead Channel Setting Pacing Pulse Width: 0.5 ms
Lead Channel Setting Sensing Sensitivity: 2 mV
Pulse Gen Model: 2272
Pulse Gen Serial Number: 8219539

## 2024-04-14 NOTE — Patient Instructions (Signed)
 Medication Instructions:  No medication changes today. *If you need a refill on your cardiac medications before your next appointment, please call your pharmacy*  Lab Work: No labwork ordered today. If you have labs (blood work) drawn today and your tests are completely normal, you will receive your results only by: MyChart Message (if you have MyChart) OR A paper copy in the mail If you have any lab test that is abnormal or we need to change your treatment, we will call you to review the results.  Testing/Procedures: No testing ordered today  Follow-Up: At [facility], you and your health needs are our priority.  As part of our continuing mission to provide you with exceptional heart care, our providers are all part of one team.  This team includes your primary Cardiologist (physician) and Advanced Practice Providers or APPs (Physician Assistants and Nurse Practitioners) who all work together to provide you with the care you need, when you need it.  Your next appointment:   12 month(s)  Provider:   You may see Ardeen Kohler, MD or one of the following Advanced Practice Providers on your designated Care Team:   Mertha Abrahams, Kennard Pea "Jonelle Neri" Bonanza, PA-C Suzann Riddle, NP Creighton Doffing, NP    We recommend signing up for the patient portal called "MyChart".  Sign up information is provided on this After Visit Summary.  MyChart is used to connect with patients for Virtual Visits (Telemedicine).  Patients are able to view lab/test results, encounter notes, upcoming appointments, etc.  Non-urgent messages can be sent to your provider as well.   To learn more about what you can do with MyChart, go to [email or web address].

## 2024-04-14 NOTE — Progress Notes (Signed)
" °  Electrophysiology Office Note:   ID:  Dylan, Harvey 10-20-1950, MRN [id number]  Primary Cardiologist: None Electrophysiologist: [name], MD      History of Present Illness:   Dylan Harvey is a 74 y.o. male with h/o COPD, h/o Lung CA, HTN, HLD, and CHB s/p PPM seen today for routine electrophysiology follow-up s/p Pacemaker implant.  Since last being seen in our clinic the patient reports doing well overall. Currently, he denies chest pain, palpitations, dyspnea, PND, orthopnea, nausea, vomiting, dizziness, syncope, edema, weight gain, or early satiety.    Review of systems complete and found to be negative unless listed in HPI.   EP Information / Studies Reviewed:    EKG is ordered today. Personal review as below.  EKG Interpretation Date/Time:  Tuesday April 14 2024 12:05:27 EST Ventricular Rate:  93 PR Interval:  174 QRS Duration:  124 QT Interval:  386 QTC Calculation: 479 R Axis:   259  Text Interpretation: Atrial-sensed ventricular-paced rhythm When compared with ECG of 17-Dec-2023 06:00, Vent. rate has increased BY  27 BPM Confirmed by Lesia Heck ([id number]) on 04/14/2024 12:07:17 PM    PPM Interrogation-  reviewed in detail today,  See PACEART report.  Arrhythmia/Device History Abbott Dual chamber PPM 11/2023 for CHB    Physical Exam:   VS:  BP 134/78   Pulse 93   Ht 6' (1.829 m)   Wt 159 lb (72.1 kg)   SpO2 98%   BMI 21.56 kg/m    Wt Readings from Last 3 Encounters:  04/14/24 159 lb (72.1 kg)  04/06/24 161 lb (73 kg)  01/29/24 157 lb 6 oz (71.4 kg)     GEN: No acute distress  NECK: No JVD; No carotid bruits CARDIAC: Regular rate and rhythm, no murmurs, rubs, gallops RESPIRATORY:  Clear to auscultation without rales, wheezing or rhonchi  ABDOMEN: Soft, non-tender, non-distended EXTREMITIES:  No edema; No deformity   ASSESSMENT AND PLAN:    CHB s/p Abbott PPM  Normal PPM function See Pace Art report No changes today  HTN Stable on  current regimen    Disposition:   Follow up with EP Team in 12 months  Signed, Ozell Prentice Lesia, PA-C  "

## 2024-04-15 ENCOUNTER — Telehealth: Payer: Self-pay | Admitting: *Deleted

## 2024-04-15 NOTE — Telephone Encounter (Signed)
 Called patient to update, spoke with patient.

## 2024-04-16 ENCOUNTER — Telehealth: Payer: Self-pay | Admitting: *Deleted

## 2024-04-16 ENCOUNTER — Other Ambulatory Visit: Payer: Self-pay | Admitting: Urology

## 2024-04-16 NOTE — Telephone Encounter (Signed)
 CALLED PATIENT TO INFORM OF HIS PRE-SEED APPTS. AND IMPLANT DATE, SPOKE WITH PATIENT AND HE IS AWARE OF THESE APPTS.

## 2024-04-17 ENCOUNTER — Telehealth: Payer: Self-pay | Admitting: *Deleted

## 2024-04-17 NOTE — Telephone Encounter (Signed)
 Called patient to inform that Ashlyn stated that she wouldn't be able to do his pre-seed appts. on a Monday, spoke with patient and he verified understanding this

## 2024-04-19 ENCOUNTER — Ambulatory Visit: Payer: Self-pay | Admitting: Cardiology

## 2024-04-22 ENCOUNTER — Ambulatory Visit: Admitting: Pulmonary Disease

## 2024-04-23 ENCOUNTER — Telehealth: Payer: Self-pay | Admitting: *Deleted

## 2024-04-23 NOTE — Telephone Encounter (Signed)
Called patient to ask question, lvm for a return call 

## 2024-04-24 ENCOUNTER — Telehealth: Payer: Self-pay | Admitting: *Deleted

## 2024-04-24 NOTE — Progress Notes (Incomplete)
 Pre-seed nursing interview for a diagnosis of  74 y.o. gentleman with Stage T1c adenocarcinoma of the prostate with Gleason score of 4+3, and PSA of 5.04.    Patient identity verified x2.   Patient states issues as follows...  -Pain: *** -Fatigue: *** -Abdomen: *** -Groin: *** -Urinary: *** -Bowels: *** -Appetite: *** -Weight:  Patient denies all other related issues at this time.  Meaningful use complete.  Urinary Management medication(s)- None at this time Urology appointment date- ***, with Dr. Lonni Han at [facility] (Brachytherapy scheduled for 06/17/24)  No vitals needed for this visit.  This concludes the interaction.  NURSE REMINDER: START 'PRE-SEED' EDUCATION VIDEO AT 4:25 mins.

## 2024-04-24 NOTE — Telephone Encounter (Signed)
 CALLED PATIENT TO ASK ABOUT COMING IN FOR PRE-SEED APPTS. ON 04-27-24, PATIENT'S WIFE AGREED TO THIS

## 2024-04-27 ENCOUNTER — Ambulatory Visit: Attending: Radiation Oncology | Admitting: Radiation Oncology

## 2024-04-27 ENCOUNTER — Ambulatory Visit: Admitting: Urology

## 2024-04-29 ENCOUNTER — Encounter

## 2024-05-18 ENCOUNTER — Inpatient Hospital Stay

## 2024-05-18 ENCOUNTER — Ambulatory Visit (HOSPITAL_COMMUNITY)

## 2024-05-25 ENCOUNTER — Inpatient Hospital Stay: Admitting: Internal Medicine

## 2024-05-28 ENCOUNTER — Ambulatory Visit: Admitting: Radiation Oncology

## 2024-05-28 ENCOUNTER — Ambulatory Visit: Admitting: Urology

## 2024-06-17 ENCOUNTER — Ambulatory Visit (HOSPITAL_COMMUNITY): Admit: 2024-06-17 | Admitting: Urology

## 2024-07-29 ENCOUNTER — Encounter

## 2024-10-28 ENCOUNTER — Encounter

## 2025-01-27 ENCOUNTER — Encounter

## 2025-04-28 ENCOUNTER — Encounter
